# Patient Record
Sex: Female | Born: 1935 | Race: White | Hispanic: No | Smoking: Never smoker
Health system: Southern US, Community
[De-identification: ages and names within clinical notes are randomized; demographics above are authoritative.]

## PROBLEM LIST (undated history)

## (undated) DIAGNOSIS — I82409 Acute embolism and thrombosis of unspecified deep veins of unspecified lower extremity: Secondary | ICD-10-CM

## (undated) DIAGNOSIS — G4700 Insomnia, unspecified: Secondary | ICD-10-CM

## (undated) DIAGNOSIS — M47812 Spondylosis without myelopathy or radiculopathy, cervical region: Secondary | ICD-10-CM

## (undated) DIAGNOSIS — F32A Depression, unspecified: Secondary | ICD-10-CM

## (undated) DIAGNOSIS — C801 Malignant (primary) neoplasm, unspecified: Secondary | ICD-10-CM

## (undated) DIAGNOSIS — F329 Major depressive disorder, single episode, unspecified: Secondary | ICD-10-CM

## (undated) DIAGNOSIS — M5090 Cervical disc disorder, unspecified, unspecified cervical region: Secondary | ICD-10-CM

## (undated) DIAGNOSIS — F0390 Unspecified dementia without behavioral disturbance: Secondary | ICD-10-CM

## (undated) DIAGNOSIS — R350 Frequency of micturition: Secondary | ICD-10-CM

## (undated) DIAGNOSIS — M810 Age-related osteoporosis without current pathological fracture: Secondary | ICD-10-CM

## (undated) DIAGNOSIS — R351 Nocturia: Secondary | ICD-10-CM

## (undated) DIAGNOSIS — K224 Dyskinesia of esophagus: Secondary | ICD-10-CM

## (undated) DIAGNOSIS — K225 Diverticulum of esophagus, acquired: Secondary | ICD-10-CM

## (undated) DIAGNOSIS — G629 Polyneuropathy, unspecified: Secondary | ICD-10-CM

## (undated) DIAGNOSIS — E785 Hyperlipidemia, unspecified: Secondary | ICD-10-CM

## (undated) DIAGNOSIS — G5600 Carpal tunnel syndrome, unspecified upper limb: Secondary | ICD-10-CM

## (undated) DIAGNOSIS — J189 Pneumonia, unspecified organism: Secondary | ICD-10-CM

## (undated) DIAGNOSIS — R944 Abnormal results of kidney function studies: Secondary | ICD-10-CM

## (undated) DIAGNOSIS — R32 Unspecified urinary incontinence: Secondary | ICD-10-CM

## (undated) DIAGNOSIS — I10 Essential (primary) hypertension: Secondary | ICD-10-CM

## (undated) DIAGNOSIS — G8929 Other chronic pain: Secondary | ICD-10-CM

## (undated) DIAGNOSIS — K219 Gastro-esophageal reflux disease without esophagitis: Secondary | ICD-10-CM

## (undated) DIAGNOSIS — N189 Chronic kidney disease, unspecified: Secondary | ICD-10-CM

## (undated) DIAGNOSIS — F419 Anxiety disorder, unspecified: Secondary | ICD-10-CM

## (undated) DIAGNOSIS — I739 Peripheral vascular disease, unspecified: Secondary | ICD-10-CM

## (undated) DIAGNOSIS — M549 Dorsalgia, unspecified: Secondary | ICD-10-CM

## (undated) DIAGNOSIS — J449 Chronic obstructive pulmonary disease, unspecified: Secondary | ICD-10-CM

## (undated) DIAGNOSIS — M5410 Radiculopathy, site unspecified: Secondary | ICD-10-CM

## (undated) DIAGNOSIS — G2581 Restless legs syndrome: Secondary | ICD-10-CM

## (undated) DIAGNOSIS — C569 Malignant neoplasm of unspecified ovary: Secondary | ICD-10-CM

## (undated) HISTORY — DX: Chronic obstructive pulmonary disease, unspecified: J44.9

## (undated) HISTORY — DX: Malignant (primary) neoplasm, unspecified: C80.1

## (undated) HISTORY — PX: ABDOMINAL HYSTERECTOMY: SHX81

## (undated) HISTORY — DX: Dyskinesia of esophagus: K22.4

## (undated) HISTORY — DX: Chronic kidney disease, unspecified: N18.9

## (undated) HISTORY — DX: Spondylosis without myelopathy or radiculopathy, cervical region: M47.812

## (undated) HISTORY — DX: Peripheral vascular disease, unspecified: I73.9

## (undated) HISTORY — DX: Abnormal results of kidney function studies: R94.4

## (undated) HISTORY — DX: Hyperlipidemia, unspecified: E78.5

## (undated) HISTORY — DX: Gastro-esophageal reflux disease without esophagitis: K21.9

## (undated) HISTORY — DX: Carpal tunnel syndrome, unspecified upper limb: G56.00

## (undated) HISTORY — DX: Anxiety disorder, unspecified: F41.9

## (undated) HISTORY — PX: TONSILLECTOMY: SUR1361

## (undated) HISTORY — DX: Diverticulum of esophagus, acquired: K22.5

## (undated) HISTORY — DX: Acute embolism and thrombosis of unspecified deep veins of unspecified lower extremity: I82.409

## (undated) HISTORY — DX: Polyneuropathy, unspecified: G62.9

## (undated) HISTORY — PX: OTHER SURGICAL HISTORY: SHX169

## (undated) HISTORY — DX: Malignant neoplasm of unspecified ovary: C56.9

## (undated) HISTORY — DX: Essential (primary) hypertension: I10

## (undated) HISTORY — DX: Age-related osteoporosis without current pathological fracture: M81.0

## (undated) HISTORY — DX: Unspecified urinary incontinence: R32

## (undated) HISTORY — DX: Radiculopathy, site unspecified: M54.10

## (undated) HISTORY — PX: COLONOSCOPY: SHX174

## (undated) HISTORY — PX: BACK SURGERY: SHX140

## (undated) HISTORY — DX: Restless legs syndrome: G25.81

---
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
## 1997-08-31 ENCOUNTER — Other Ambulatory Visit: Admission: RE | Admit: 1997-08-31 | Discharge: 1997-08-31 | Payer: Self-pay | Admitting: Gynecology

## 1998-01-05 ENCOUNTER — Ambulatory Visit (HOSPITAL_COMMUNITY): Admission: RE | Admit: 1998-01-05 | Discharge: 1998-01-05 | Payer: Self-pay | Admitting: *Deleted

## 1998-01-15 ENCOUNTER — Ambulatory Visit (HOSPITAL_COMMUNITY): Admission: RE | Admit: 1998-01-15 | Discharge: 1998-01-15 | Payer: Self-pay | Admitting: *Deleted

## 1998-06-29 ENCOUNTER — Ambulatory Visit (HOSPITAL_COMMUNITY): Admission: RE | Admit: 1998-06-29 | Discharge: 1998-06-29 | Payer: Self-pay | Admitting: Gastroenterology

## 1998-11-08 ENCOUNTER — Other Ambulatory Visit: Admission: RE | Admit: 1998-11-08 | Discharge: 1998-11-08 | Payer: Self-pay | Admitting: Gynecology

## 1999-11-17 ENCOUNTER — Other Ambulatory Visit: Admission: RE | Admit: 1999-11-17 | Discharge: 1999-11-17 | Payer: Self-pay | Admitting: Gynecology

## 1999-12-08 ENCOUNTER — Encounter: Payer: Self-pay | Admitting: Gynecology

## 1999-12-08 ENCOUNTER — Encounter: Admission: RE | Admit: 1999-12-08 | Discharge: 1999-12-08 | Payer: Self-pay | Admitting: Gynecology

## 2000-05-15 ENCOUNTER — Other Ambulatory Visit: Admission: RE | Admit: 2000-05-15 | Discharge: 2000-05-15 | Payer: Self-pay | Admitting: *Deleted

## 2000-11-29 ENCOUNTER — Other Ambulatory Visit: Admission: RE | Admit: 2000-11-29 | Discharge: 2000-11-29 | Payer: Self-pay | Admitting: *Deleted

## 2000-12-10 ENCOUNTER — Encounter: Payer: Self-pay | Admitting: Gynecology

## 2000-12-10 ENCOUNTER — Encounter: Admission: RE | Admit: 2000-12-10 | Discharge: 2000-12-10 | Payer: Self-pay | Admitting: Gynecology

## 2001-10-28 ENCOUNTER — Other Ambulatory Visit: Admission: RE | Admit: 2001-10-28 | Discharge: 2001-10-28 | Payer: Self-pay | Admitting: Gynecology

## 2002-01-08 ENCOUNTER — Encounter: Admission: RE | Admit: 2002-01-08 | Discharge: 2002-01-08 | Payer: Self-pay | Admitting: Gynecology

## 2002-01-08 ENCOUNTER — Encounter: Payer: Self-pay | Admitting: Gynecology

## 2002-08-08 ENCOUNTER — Encounter: Admission: RE | Admit: 2002-08-08 | Discharge: 2002-08-08 | Payer: Self-pay | Admitting: Internal Medicine

## 2002-08-08 ENCOUNTER — Encounter: Payer: Self-pay | Admitting: Internal Medicine

## 2002-08-21 ENCOUNTER — Ambulatory Visit (HOSPITAL_COMMUNITY): Admission: RE | Admit: 2002-08-21 | Discharge: 2002-08-21 | Payer: Self-pay | Admitting: Cardiology

## 2002-10-28 ENCOUNTER — Other Ambulatory Visit: Admission: RE | Admit: 2002-10-28 | Discharge: 2002-10-28 | Payer: Self-pay | Admitting: Gynecology

## 2002-11-11 ENCOUNTER — Encounter: Admission: RE | Admit: 2002-11-11 | Discharge: 2002-12-31 | Payer: Self-pay | Admitting: Internal Medicine

## 2003-02-02 ENCOUNTER — Encounter: Admission: RE | Admit: 2003-02-02 | Discharge: 2003-02-02 | Payer: Self-pay | Admitting: Family Medicine

## 2003-02-02 ENCOUNTER — Encounter: Admission: RE | Admit: 2003-02-02 | Discharge: 2003-02-02 | Payer: Self-pay | Admitting: Gynecology

## 2003-10-29 ENCOUNTER — Other Ambulatory Visit: Admission: RE | Admit: 2003-10-29 | Discharge: 2003-10-29 | Payer: Self-pay | Admitting: Gynecology

## 2003-12-14 ENCOUNTER — Encounter: Admission: RE | Admit: 2003-12-14 | Discharge: 2003-12-14 | Payer: Self-pay | Admitting: Internal Medicine

## 2004-01-06 ENCOUNTER — Ambulatory Visit (HOSPITAL_COMMUNITY): Admission: RE | Admit: 2004-01-06 | Discharge: 2004-01-06 | Payer: Self-pay | Admitting: Gastroenterology

## 2004-02-22 ENCOUNTER — Encounter: Admission: RE | Admit: 2004-02-22 | Discharge: 2004-02-22 | Payer: Self-pay | Admitting: Internal Medicine

## 2004-11-04 ENCOUNTER — Other Ambulatory Visit: Admission: RE | Admit: 2004-11-04 | Discharge: 2004-11-04 | Payer: Self-pay | Admitting: Gynecology

## 2005-02-22 ENCOUNTER — Encounter: Admission: RE | Admit: 2005-02-22 | Discharge: 2005-02-22 | Payer: Self-pay | Admitting: Gynecology

## 2005-06-27 ENCOUNTER — Ambulatory Visit (HOSPITAL_BASED_OUTPATIENT_CLINIC_OR_DEPARTMENT_OTHER): Admission: RE | Admit: 2005-06-27 | Discharge: 2005-06-27 | Payer: Self-pay | Admitting: Orthopedic Surgery

## 2006-02-23 ENCOUNTER — Encounter: Admission: RE | Admit: 2006-02-23 | Discharge: 2006-02-23 | Payer: Self-pay | Admitting: Gynecology

## 2006-03-05 ENCOUNTER — Encounter: Admission: RE | Admit: 2006-03-05 | Discharge: 2006-03-05 | Payer: Self-pay | Admitting: Gynecology

## 2006-08-23 ENCOUNTER — Encounter: Admission: RE | Admit: 2006-08-23 | Discharge: 2006-08-23 | Payer: Self-pay | Admitting: Internal Medicine

## 2006-11-19 ENCOUNTER — Other Ambulatory Visit: Admission: RE | Admit: 2006-11-19 | Discharge: 2006-11-19 | Payer: Self-pay | Admitting: Gynecology

## 2007-02-26 ENCOUNTER — Encounter: Admission: RE | Admit: 2007-02-26 | Discharge: 2007-02-26 | Payer: Self-pay | Admitting: Internal Medicine

## 2008-02-27 ENCOUNTER — Encounter: Admission: RE | Admit: 2008-02-27 | Discharge: 2008-02-27 | Payer: Self-pay | Admitting: Gynecology

## 2009-03-10 ENCOUNTER — Encounter: Admission: RE | Admit: 2009-03-10 | Discharge: 2009-03-10 | Payer: Self-pay | Admitting: Gynecology

## 2009-03-26 ENCOUNTER — Encounter: Admission: RE | Admit: 2009-03-26 | Discharge: 2009-03-26 | Payer: Self-pay | Admitting: Orthopedic Surgery

## 2009-11-29 ENCOUNTER — Encounter: Admission: RE | Admit: 2009-11-29 | Discharge: 2009-11-29 | Payer: Self-pay | Admitting: Gynecology

## 2010-01-31 ENCOUNTER — Emergency Department: Payer: Self-pay | Admitting: Emergency Medicine

## 2010-03-11 ENCOUNTER — Encounter
Admission: RE | Admit: 2010-03-11 | Discharge: 2010-03-11 | Payer: Self-pay | Source: Home / Self Care | Attending: Gynecology | Admitting: Gynecology

## 2010-04-03 ENCOUNTER — Encounter: Payer: Self-pay | Admitting: Gynecology

## 2010-07-29 NOTE — Op Note (Signed)
NAMEGLENN, Misty Patterson                ACCOUNT NO.:  [id number]   MEDICAL RECORD NO.:  [id number]          PATIENT TYPE:  AMB   LOCATION:  [facility]                         FACILITY:  [facility]   PHYSICIAN:  Deidre Ala, M.D.    DATE OF BIRTH:  Jan 11, 1927   DATE OF PROCEDURE:  06/27/2005  DATE OF DISCHARGE:                                 OPERATIVE REPORT   PREOPERATIVE DIAGNOSES:  1.  Right third metatarsalgia, plantar.  2.  Recurrent mallet toe, right fifth toe, with painful osteophyte at      dorsolateral distal interphalangeal joint.   POSTOPERATIVE DIAGNOSES:  1.  Right third metatarsalgia, plantar.  2.  Recurrent mallet toe, right fifth toe, with painful osteophyte at      dorsolateral distal interphalangeal joint.   PROCEDURE:  1.  Right foot dorsal wedge osteotomy with local autograft, right third      metatarsal neck.  2.  Right fifth toe debridement of distal interphalangeal      osteophytes/resection of condyle at distal phalanx and middle phalanx.  3.  DuVries corrective dorsal wedge osteotomy with correction of mallet toe,      right fifth toe.   SURGEON:  1.  Charlesetta Shanks, M.D.   ASSISTANT:  Clarene Reamer, P.A.-C.   ANESTHESIA:  General with LMA.   CULTURES:  None.   DRAINS:  None.   ESTIMATED BLOOD LOSS:  Minimal.   TOURNIQUET TIME:  Esmarch tourniquet at 30 minutes.   PATHOLOGIC FINDINGS AND HISTORY:  Torsha came to me with vague foot  complaints on June 19, 2005.  She did have a painful callus/osteophyte  underlying her DIP joint of the right fifth toe.  I do not have old records,  but she stated that I had done DuVries osteotomies there before.  The toe  was feeling somewhat bunched-up and she had had some freezing-off of the  skin area and wearing some pads, but it had not worked, and it was very  painful.  It seemed to me that it was an osteophyte pressing underneath the  toe.  When she came in today, it was clear to me that one of the things that  was  bothering her was that the toe was stiff and bunching-out to the side  somewhat laterally, so what I elected to do, and we talked about it  preoperatively, was to take off the osteophyte, but also to do a closing  edge osteotomy.  Actually, the toe was crossing over into the fourth toe so  that the osteotomy was from the DuVries at the DIP joint with a wedge  dorsolateral, closing it down, bring the toe more lateral in line with the  fourth toe, which was somewhat laterally situated, nestling it nicely down  into the sulcus at the base of the right fourth toe, shortening it and  straightening it somewhat so that it would be less prominent and conspicuous  and getting pressure in the shoe.  I also excised skin as one does with the  DuVries with the edge, excising the irritated skin overtop.  She did  have  very sharp osteophytes that I removed first.  Then on the right third toe, I  went in the interspace between 2 and 3 and did the classic dorsal edge  osteotomy, morcellizing the bone graft for autograft.  I did not use  fixation, but I did suture the capsule dorsally, bringing the metatarsal  head upward.  The second and fourth metatarsal heads were not painful and  not prominent preoperatively significantly, but the third was and appeared  longer and more prominent.  This brought it up and I think the second and  fourth will take pressure also now that the third is up and further pad the  area.  This third metatarsal head was something that she complained about  today that was somewhat clearer to me today about her foot pain.  When we  got right down to exactly the spots that were hurting her, it was the right  third metatarsal head that felt over-long, taking the pressure and this  reproduced her pain when I pressed there; she was not painful under 1, 2, 3  or 4 and she was complaining of forefoot discomfort with walking and this  solidified the concept for her and we elected to proceed  with a dorsal wedge  osteotomy.   DESCRIPTION OF PROCEDURE:  With adequate anesthesia obtained using LMA  technique, 1 g of Ancef given IV prophylaxis, the patient was placed in a  supine position and the right lower extremity was prepped from the toes to  the ankle in a standard fashion.  After standard prepping and draping,  Esmarch exsanguination was used and the tourniquet was left on above the  ankle.  I then made an incision, excising skin on the dorsolateral aspect of  the DIP joint of the right fifth toe.  I then dissected down through the  capsule, removing sharply the large osteophytes underneath that were  irritating the skin.  I then took a bone cutter and cut the wedge of bone  out based with the apex plantar-medial and then the opposite side of the  base of the wedge was dorsolateral.  I then cut the flexor tendon.  I then  brought the toe up, configuring it shorter, straighter and nestled down in  the concavity of the base of the fourth where she seemed to want it to go,  so it would not be catching in the shoe with pressure, and I closed it over  bolsters of Xeroflo rolled up on a Q-tip.  I aspirated the DuVries procedure  and sutured a vertical mattress suture of 4-0 nylon and then sutured on  either side of it; this closed the skin, corrected the position of the toe  and held it.  I then made an incision in the 2-3 web space, dissected down  to the neck of the third metatarsal, placed retractors, used the oscillating  saw to cut the osteotomy, the first cut at the metatarsal neck and the  second one at an ankle, morcellized the bone that was removed and then  cracked the metatarsal head upward, placed the bone graft around it, sutured  the dorsal capsule with 3-0 Vicryl and then closed that wound after  cauterizing bleeding points with a running 4-0 nylon.  A bulky sterile  compressive forefoot dressing was applied with a wooden sole shoe, Darby type, over top of the  Ace.  The tourniquet was let down.  The patient,  having tolerated the procedure well, was taken  to the recovery room in  satisfactory condition and will be discharged per outpatient routine,  weightbearing as tolerated on her heel with elevation, given Percocet for  pain and told to call the office in 3-4 days.           ______________________________  V. Charlesetta Shanks, M.D.     VEP/MEDQ  D:  06/27/2005  T:  06/28/2005  Job:  [id number]   cc:   Antionette Char, MD  Fax: [phone]

## 2010-07-29 NOTE — Op Note (Signed)
Misty Patterson, Misty Patterson                ACCOUNT NO.:  [id number]   MEDICAL RECORD NO.:  [id number]          PATIENT TYPE:  AMB   LOCATION:  ENDO                         FACILITY:  [facility]   PHYSICIAN:  Anselmo Rod, M.D.  DATE OF BIRTH:  [birth year]   DATE OF PROCEDURE:  01/06/2004  DATE OF DISCHARGE:                                 OPERATIVE REPORT   PROCEDURE PERFORMED:  Screening colonoscopy.   ENDOSCOPIST:  Charna Elizabeth, M.D.   INSTRUMENT USED:  Olympus video colonoscope.   INDICATION FOR PROCEDURE:  A 77 year old white female with a personal  history of ovarian cancer and a history of colon polyps, undergoing  screening colonoscopy to rule out colonic polyps, masses, etc.   PREPROCEDURE PREPARATION:  Informed consent was procured from the patient.  The patient was fasted for 8 hours prior to the procedure and prepped with a  bottle of magnesium citrate and a gallon of GoLYTELY the night prior to the  procedure.   PREPROCEDURE PHYSICAL:  The patient had stable vital signs.  NECK:  Supple.  CHEST:  Clear to auscultation, S1, S2 regular.  ABDOMEN:  Soft with normal bowel sounds.   DESCRIPTION OF THE PROCEDURE:  The patient was placed in the left lateral  decubitus position, sedated with 60 mg of Demerol and 6 mg of Versed in slow  incremental doses.  Once the patient was adequately sedated and maintained  on low flow oxygen and continuous cardiac monitoring, the Olympus video  colonoscope was advanced from the rectum to the cecum.  The appendicular  orifice and ileocecal valve were clearly visualized and photographed.  No  masses, polyps, erosions, ulcerations or diverticula were seen.  The patient  tolerated the procedure well without immediate complications.   IMPRESSION:  Normal colonoscopy up to the cecum.  No masses, polyps or  diverticula were seen.   RECOMMENDATIONS:  1.  Continue on high fiber diet with liberal fluid intake.  2.  Repeat colonoscopy in the next 5 years  unless the patient develops any      abnormal symptoms in the interim.  3.  Outpatient followup as need arises in the future.       JNM/MEDQ  D:  01/06/2004  T:  01/06/2004  Job:  [id number]   cc:   Georgann Housekeeper, MD  [address]  [place]  [place] [address]  Fax: [phone]
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
## 2010-12-07 ENCOUNTER — Other Ambulatory Visit: Payer: Self-pay | Admitting: Dermatology

## 2011-01-16 ENCOUNTER — Other Ambulatory Visit: Payer: Self-pay | Admitting: Gynecology

## 2011-01-16 DIAGNOSIS — Z1231 Encounter for screening mammogram for malignant neoplasm of breast: Secondary | ICD-10-CM

## 2011-03-13 ENCOUNTER — Ambulatory Visit
Admission: RE | Admit: 2011-03-13 | Discharge: 2011-03-13 | Disposition: A | Discharge status: Home / Self Care | Payer: Medicare Other | Source: Ambulatory Visit | Attending: Gynecology | Admitting: Gynecology

## 2011-03-13 DIAGNOSIS — Z1231 Encounter for screening mammogram for malignant neoplasm of breast: Secondary | ICD-10-CM

## 2011-03-16 ENCOUNTER — Other Ambulatory Visit: Payer: Self-pay | Admitting: Gynecology

## 2011-03-16 DIAGNOSIS — R928 Other abnormal and inconclusive findings on diagnostic imaging of breast: Secondary | ICD-10-CM

## 2011-03-24 ENCOUNTER — Ambulatory Visit
Admission: RE | Admit: 2011-03-24 | Discharge: 2011-03-24 | Disposition: A | Discharge status: Home / Self Care | Payer: Medicare Other | Source: Ambulatory Visit | Attending: Gynecology | Admitting: Gynecology

## 2011-03-24 ENCOUNTER — Other Ambulatory Visit: Payer: Self-pay | Admitting: Gynecology

## 2011-03-24 DIAGNOSIS — R928 Other abnormal and inconclusive findings on diagnostic imaging of breast: Secondary | ICD-10-CM

## 2011-04-20 ENCOUNTER — Other Ambulatory Visit: Payer: Self-pay | Admitting: Internal Medicine

## 2011-04-28 ENCOUNTER — Ambulatory Visit
Admission: RE | Admit: 2011-04-28 | Discharge: 2011-04-28 | Disposition: A | Discharge status: Home / Self Care | Payer: Medicare Other | Source: Ambulatory Visit | Attending: Internal Medicine | Admitting: Internal Medicine

## 2011-12-12 ENCOUNTER — Other Ambulatory Visit: Payer: Self-pay | Admitting: Internal Medicine

## 2011-12-12 ENCOUNTER — Ambulatory Visit
Admission: RE | Admit: 2011-12-12 | Discharge: 2011-12-12 | Disposition: A | Discharge status: Home / Self Care | Payer: No Typology Code available for payment source | Source: Ambulatory Visit | Attending: Internal Medicine | Admitting: Internal Medicine

## 2011-12-12 DIAGNOSIS — R519 Headache, unspecified: Secondary | ICD-10-CM

## 2011-12-12 DIAGNOSIS — W19XXXA Unspecified fall, initial encounter: Secondary | ICD-10-CM

## 2011-12-12 DIAGNOSIS — R42 Dizziness and giddiness: Secondary | ICD-10-CM

## 2011-12-25 ENCOUNTER — Other Ambulatory Visit: Payer: Self-pay

## 2011-12-25 DIAGNOSIS — J449 Chronic obstructive pulmonary disease, unspecified: Secondary | ICD-10-CM

## 2011-12-25 DIAGNOSIS — R0602 Shortness of breath: Secondary | ICD-10-CM

## 2011-12-28 ENCOUNTER — Ambulatory Visit (HOSPITAL_COMMUNITY)
Admission: RE | Admit: 2011-12-28 | Discharge: 2011-12-28 | Disposition: A | Discharge status: Home / Self Care | Payer: Medicare Other | Source: Ambulatory Visit | Attending: Internal Medicine | Admitting: Internal Medicine

## 2011-12-28 DIAGNOSIS — R0602 Shortness of breath: Secondary | ICD-10-CM | POA: Insufficient documentation

## 2011-12-28 MED ORDER — ALBUTEROL SULFATE (5 MG/ML) 0.5% IN NEBU
2.5000 mg | INHALATION_SOLUTION | Freq: Once | RESPIRATORY_TRACT | Status: AC
  Administered 2011-12-28: 2.5 mg via RESPIRATORY_TRACT

## 2012-02-23 ENCOUNTER — Other Ambulatory Visit: Payer: Self-pay | Admitting: Gynecology

## 2012-02-23 DIAGNOSIS — Z1231 Encounter for screening mammogram for malignant neoplasm of breast: Secondary | ICD-10-CM

## 2012-03-27 ENCOUNTER — Ambulatory Visit
Admission: RE | Admit: 2012-03-27 | Discharge: 2012-03-27 | Disposition: A | Discharge status: Home / Self Care | Payer: Medicare Other | Source: Ambulatory Visit | Attending: Gynecology | Admitting: Gynecology

## 2012-03-27 DIAGNOSIS — Z1231 Encounter for screening mammogram for malignant neoplasm of breast: Secondary | ICD-10-CM

## 2012-04-09 ENCOUNTER — Ambulatory Visit: Payer: Medicare Other

## 2012-07-03 ENCOUNTER — Other Ambulatory Visit: Payer: Self-pay | Admitting: Dermatology

## 2013-01-13 ENCOUNTER — Ambulatory Visit
Admission: RE | Admit: 2013-01-13 | Discharge: 2013-01-13 | Disposition: A | Discharge status: Home / Self Care | Payer: Medicare Other | Source: Ambulatory Visit | Attending: Internal Medicine | Admitting: Internal Medicine

## 2013-01-13 ENCOUNTER — Other Ambulatory Visit: Payer: Self-pay | Admitting: Internal Medicine

## 2013-01-13 DIAGNOSIS — M25579 Pain in unspecified ankle and joints of unspecified foot: Secondary | ICD-10-CM

## 2013-01-28 ENCOUNTER — Ambulatory Visit (INDEPENDENT_AMBULATORY_CARE_PROVIDER_SITE_OTHER): Payer: Self-pay | Admitting: Radiology

## 2013-01-28 ENCOUNTER — Encounter (INDEPENDENT_AMBULATORY_CARE_PROVIDER_SITE_OTHER): Payer: Self-pay

## 2013-01-28 ENCOUNTER — Ambulatory Visit (INDEPENDENT_AMBULATORY_CARE_PROVIDER_SITE_OTHER): Payer: Medicare Other | Admitting: Neurology

## 2013-01-28 DIAGNOSIS — R209 Unspecified disturbances of skin sensation: Secondary | ICD-10-CM

## 2013-01-28 DIAGNOSIS — G5600 Carpal tunnel syndrome, unspecified upper limb: Secondary | ICD-10-CM

## 2013-01-28 DIAGNOSIS — G542 Cervical root disorders, not elsewhere classified: Secondary | ICD-10-CM

## 2013-01-28 DIAGNOSIS — G5601 Carpal tunnel syndrome, right upper limb: Secondary | ICD-10-CM

## 2013-01-28 DIAGNOSIS — Z0289 Encounter for other administrative examinations: Secondary | ICD-10-CM

## 2013-01-28 DIAGNOSIS — G5602 Carpal tunnel syndrome, left upper limb: Secondary | ICD-10-CM

## 2013-01-28 NOTE — Procedures (Signed)
HISTORY:  Misty Patterson is an 79 year old patient with a history of a fall that occurred in October 2013. Since that time, the patient has had some numbness in the hands, and some right leg discomfort. The patient is being evaluated for possible neuropathy or a cervical radiculopathy.  NERVE CONDUCTION STUDIES:  Nerve conduction studies were performed on both upper extremities. The distal motor latencies for the median nerves were prolonged on the right, borderline normal on the left, with normal motor amplitudes for these nerves bilaterally. The distal motor latencies and motor amplitudes for the ulnar nerves were normal bilaterally. The F wave latencies and nerve conduction velocities for the median and ulnar nerves were normal bilaterally. The sensory latencies for the median nerves were absent on the right, prolonged on the left, and prolonged for the ulnar and radial sensory latencies bilaterally.  EMG STUDIES:  EMG study was performed on the right upper extremity:  The first dorsal interosseous muscle reveals 2 to 5 K units with decreased recruitment. No fibrillations or positive waves were noted. The abductor pollicis brevis muscle reveals 2 to 4 K units with full recruitment. No fibrillations or positive waves were noted. The extensor indicis proprius muscle reveals 1 to 3 K units with full recruitment. No fibrillations or positive waves were noted. The pronator teres muscle reveals 2 to 3 K units with full recruitment. No fibrillations or positive waves were noted. The biceps muscle reveals 1 to 2 K units with full recruitment. No fibrillations or positive waves were noted. The triceps muscle reveals 2 to 4 K units with full recruitment. No fibrillations or positive waves were noted. The anterior deltoid muscle reveals 2 to 3 K units with full recruitment. No fibrillations or positive waves were noted. The cervical paraspinal muscles were tested at 2 levels. No abnormalities of  insertional activity were seen at either level tested. There was good relaxation.  EMG study was performed on the left upper extremity:  The first dorsal interosseous muscle reveals 2 to 5 K units with decreased recruitment. No fibrillations or positive waves were noted. The abductor pollicis brevis muscle reveals 2 to 5 K units with decreased recruitment. No fibrillations or positive waves were noted. The extensor indicis proprius muscle reveals 2 to 5 K units with decreased recruitment. No fibrillations or positive waves were noted. The pronator teres muscle reveals 2 to 6 K units with decreased recruitment. No fibrillations or positive waves were noted. The biceps muscle reveals 1 to 2 K units with full recruitment. No fibrillations or positive waves were noted. The triceps muscle reveals 2 to 5 K units with decreased recruitment. No fibrillations or positive waves were noted. Complex repetitive discharges were seen. The anterior deltoid muscle reveals 2 to 3 K units with full recruitment. No fibrillations or positive waves were noted. The cervical paraspinal muscles were tested at 2 levels. No abnormalities of insertional activity were seen at either level tested. There was good relaxation.   IMPRESSION:  Nerve conduction studies done on both upper extremities shows evidence of a mild right carpal tunnel syndrome, and a borderline left carpal tunnel syndrome. There is diffuse prolongation of the sensory latencies in the upper extremities, and the possibility of an overlying peripheral neuropathy should be considered. The patient does report some numbness and burning sensations in the feet. EMG evaluation of the right upper extremity shows some mild distal chronic denervation consistent with a peripheral neuropathy, but no evidence of an overlying cervical radiculopathy. EMG evaluation  of the left upper extremity shows findings consistent with a chronic stable C8 and C7 radiculopathy.  Marlan Palau MD 01/28/2013 4:32 PM  [facility] [address] [place], [place] [address]  Phone [phone] Fax [phone]

## 2013-03-19 ENCOUNTER — Other Ambulatory Visit (HOSPITAL_COMMUNITY): Payer: Self-pay | Admitting: Orthopedic Surgery

## 2013-03-19 ENCOUNTER — Ambulatory Visit (HOSPITAL_COMMUNITY)
Admission: RE | Admit: 2013-03-19 | Discharge: 2013-03-19 | Disposition: A | Discharge status: Home / Self Care | Payer: Medicare Other | Source: Ambulatory Visit | Attending: Vascular Surgery | Admitting: Vascular Surgery

## 2013-03-19 DIAGNOSIS — R0989 Other specified symptoms and signs involving the circulatory and respiratory systems: Secondary | ICD-10-CM

## 2013-03-19 DIAGNOSIS — M79609 Pain in unspecified limb: Secondary | ICD-10-CM

## 2013-04-03 ENCOUNTER — Ambulatory Visit (INDEPENDENT_AMBULATORY_CARE_PROVIDER_SITE_OTHER): Payer: Medicare Other | Admitting: Vascular Surgery

## 2013-04-03 ENCOUNTER — Encounter: Payer: Self-pay | Admitting: Vascular Surgery

## 2013-04-03 VITALS — BP 133/72 | HR 73 | Wt 146.0 lb

## 2013-04-03 DIAGNOSIS — I739 Peripheral vascular disease, unspecified: Secondary | ICD-10-CM

## 2013-04-03 NOTE — Progress Notes (Signed)
[facility] [place] HISTORY AND PHYSICAL   History of Present Illness:  Patient is a 80 y.o. year old female who presents for evaluation of right leg pain. She currently experiences tightness and pain in her right calf after walking one block. She started developing a symptoms after breaking her toe approximately one year ago. She has had bilateral saphenous vein stripping. She denies rest pain. She denies nonhealing wounds.  Other medical problems include past history of ovarian cancer and hypertension both of which are controlled. Otherwise overall she is fairly healthy and performed most of her tasks of daily living independently. She does not smoke.  Past Medical History  Diagnosis Date  . Cancer     ovarian    Past Surgical History  Procedure Laterality Date  . Vein surgery  1950    Social History History  Substance Use Topics  . Smoking status: Never Smoker   . Smokeless tobacco: Never Used  . Alcohol Use: No    Family History History reviewed. No pertinent family history.  Allergies  No Known Allergies   Current Outpatient Prescriptions  Medication Sig Dispense Refill  . amLODipine (NORVASC) 5 MG tablet Take 1 tablet by mouth daily.      Marland Kitchen atenolol (TENORMIN) 25 MG tablet Take 12.5 mg by mouth daily.      . diazepam (VALIUM) 5 MG tablet Take 1 tablet by mouth as needed.      . famotidine (PEPCID) 20 MG tablet Take 1 tablet by mouth daily.      . hydrochlorothiazide (HYDRODIURIL) 25 MG tablet Take 0.5 tablets by mouth daily.      Marland Kitchen HYDROcodone-acetaminophen (NORCO/VICODIN) 5-325 MG per tablet Take 1 tablet by mouth as needed.      . sertraline (ZOLOFT) 25 MG tablet Take 1 tablet by mouth daily.      Marland Kitchen sulfamethoxazole-trimethoprim (BACTRIM DS) 800-160 MG per tablet Take 1 tablet by mouth 2 (two) times daily.      . traZODone (DESYREL) 50 MG tablet Take 1 tablet by mouth daily.       No current facility-administered medications for this visit.     ROS:   General:  No weight loss, Fever, chills  HEENT: No recent headaches, no nasal bleeding, no visual changes, no sore throat  Neurologic: No dizziness, blackouts, seizures. No recent symptoms of stroke or mini- stroke. No recent episodes of slurred speech, or temporary blindness.  Cardiac: No recent episodes of chest pain/pressure, no shortness of breath at rest.  No shortness of breath with exertion.  Denies history of atrial fibrillation or irregular heartbeat  Vascular: No history of rest pain in feet.  + history of claudication.  No history of non-healing ulcer, No history of DVT   Pulmonary: No home oxygen, no productive cough, no hemoptysis,  No asthma or wheezing  Musculoskeletal:  [ ]  Arthritis, [ ]  Low back pain,  [ ]  Joint pain  Hematologic:No history of hypercoagulable state.  No history of easy bleeding.  No history of anemia  Gastrointestinal: No hematochezia or melena,  No gastroesophageal reflux, no trouble swallowing  Urinary: [ ]  chronic Kidney disease, [ ]  on HD - [ ]  MWF or [ ]  TTHS, [ ]  Burning with urination, [ ]  Frequent urination, [ ]  Difficulty urinating;   Skin: No rashes  Psychological: No history of anxiety,  No history of depression   Physical Examination  Filed Vitals:   04/03/13 1000  BP: 133/72  Pulse: 73  Weight: 146 lb (66.225 kg)  SpO2: 100%    There is no height on file to calculate BMI.  General:  Alert and oriented, no acute distress HEENT: Normal Neck: No bruit or JVD Pulmonary: Clear to auscultation bilaterally Cardiac: Regular Rate and Rhythm without murmur Abdomen: Soft, non-tender, non-distended, no mass, no scars Skin: No rash Extremity Pulses:  2+ radial, brachial, femoral, right dorsalis pedis, absent left dorsalis pedis and posterior tibial pulse Musculoskeletal: No deformity or edema  Neurologic: Upper and lower extremity motor 5/5 and symmetric  DATA:  Patient had bilateral ABIs on January 7 which I reviewed  today. She ABI left 0.9 a with triphasic waveforms right was 0.49 monophasic waveforms   ASSESSMENT:  Right lower extremity claudication. I discussed with the patient that currently she does not have limb threatening ischemia. I discussed with her the possibility of a walking program and conservative management versus intervention. At this point she has opted for aortogram possible percutaneous intervention. Her arteriogram is scheduled for February 20. Risks benefits possible complications and procedure details including but limited to bleeding infection contrast reaction vessel injury were explained the patient today. She understands and agrees to proceed.   PLAN:  See above  Ruta Hinds, MD [facility] [place] Office: [phone] Pager: [phone]

## 2013-04-04 ENCOUNTER — Other Ambulatory Visit: Payer: Self-pay | Admitting: *Deleted

## 2013-04-28 ENCOUNTER — Encounter (HOSPITAL_COMMUNITY): Payer: Self-pay | Admitting: Pharmacy Technician

## 2013-05-02 ENCOUNTER — Encounter (HOSPITAL_COMMUNITY)
Admission: RE | Disposition: A | Discharge status: Home / Self Care | Payer: Self-pay | Source: Ambulatory Visit | Attending: Vascular Surgery

## 2013-05-02 ENCOUNTER — Ambulatory Visit (HOSPITAL_COMMUNITY)
Admission: RE | Admit: 2013-05-02 | Discharge: 2013-05-02 | Disposition: A | Discharge status: Home / Self Care | Payer: Medicare Other | Source: Ambulatory Visit | Attending: Vascular Surgery | Admitting: Vascular Surgery

## 2013-05-02 DIAGNOSIS — I70219 Atherosclerosis of native arteries of extremities with intermittent claudication, unspecified extremity: Secondary | ICD-10-CM

## 2013-05-02 DIAGNOSIS — Z8543 Personal history of malignant neoplasm of ovary: Secondary | ICD-10-CM | POA: Insufficient documentation

## 2013-05-02 DIAGNOSIS — I10 Essential (primary) hypertension: Secondary | ICD-10-CM | POA: Insufficient documentation

## 2013-05-02 HISTORY — PX: ABDOMINAL AORTAGRAM: SHX5706

## 2013-05-02 HISTORY — PX: ABDOMINAL AORTAGRAM: SHX5454

## 2013-05-02 LAB — CBC
HEMATOCRIT: 39.8 % (ref 36.0–46.0)
HEMOGLOBIN: 13.7 g/dL (ref 12.0–15.0)
MCH: 30.4 pg (ref 26.0–34.0)
MCHC: 34.4 g/dL (ref 30.0–36.0)
MCV: 88.2 fL (ref 78.0–100.0)
Platelets: 189 10*3/uL (ref 150–400)
RBC: 4.51 MIL/uL (ref 3.87–5.11)
RDW: 12.9 % (ref 11.5–15.5)
WBC: 6.8 10*3/uL (ref 4.0–10.5)

## 2013-05-02 LAB — POCT I-STAT, CHEM 8
BUN: 18 mg/dL (ref 6–23)
CALCIUM ION: 1.21 mmol/L (ref 1.13–1.30)
CHLORIDE: 97 meq/L (ref 96–112)
Creatinine, Ser: 1 mg/dL (ref 0.50–1.10)
GLUCOSE: 106 mg/dL — AB (ref 70–99)
HEMATOCRIT: 46 % (ref 36.0–46.0)
Hemoglobin: 15.6 g/dL — ABNORMAL HIGH (ref 12.0–15.0)
POTASSIUM: 3.6 meq/L — AB (ref 3.7–5.3)
Sodium: 138 mEq/L (ref 137–147)
TCO2: 27 mmol/L (ref 0–100)

## 2013-05-02 LAB — PROTIME-INR
INR: 0.96 (ref 0.00–1.49)
Prothrombin Time: 12.6 seconds (ref 11.6–15.2)

## 2013-05-02 SURGERY — ABDOMINAL AORTAGRAM
Anesthesia: LOCAL

## 2013-05-02 MED ORDER — CLOPIDOGREL BISULFATE 75 MG PO TABS: 75.0000 mg | ORAL_TABLET | Freq: Every day | ORAL | Status: DC

## 2013-05-02 MED ORDER — SODIUM CHLORIDE 0.9 % IV SOLN
INTRAVENOUS | Status: DC
  Administered 2013-05-02: 08:00:00 via INTRAVENOUS

## 2013-05-02 MED ORDER — OXYCODONE HCL 5 MG PO TABS: 5.0000 mg | ORAL_TABLET | ORAL | Status: DC | PRN

## 2013-05-02 MED ORDER — LABETALOL HCL 5 MG/ML IV SOLN: 10.0000 mg | INTRAVENOUS | Status: DC | PRN

## 2013-05-02 MED ORDER — HEPARIN (PORCINE) IN NACL 2-0.9 UNIT/ML-% IJ SOLN
INTRAMUSCULAR | Status: AC
  Filled 2013-05-02: qty 1000

## 2013-05-02 MED ORDER — FENTANYL CITRATE 0.05 MG/ML IJ SOLN
INTRAMUSCULAR | Status: AC
  Filled 2013-05-02: qty 2

## 2013-05-02 MED ORDER — CLOPIDOGREL BISULFATE 300 MG PO TABS: 300.0000 mg | ORAL_TABLET | Freq: Once | ORAL | Status: DC

## 2013-05-02 MED ORDER — ACETAMINOPHEN 325 MG PO TABS
325.0000 mg | ORAL_TABLET | ORAL | Status: DC | PRN
  Filled 2013-05-02: qty 2

## 2013-05-02 MED ORDER — ACETAMINOPHEN 325 MG RE SUPP
325.0000 mg | RECTAL | Status: DC | PRN
  Filled 2013-05-02: qty 2

## 2013-05-02 MED ORDER — LIDOCAINE HCL (PF) 1 % IJ SOLN
INTRAMUSCULAR | Status: AC
  Filled 2013-05-02: qty 30

## 2013-05-02 MED ORDER — ONDANSETRON HCL 4 MG/2ML IJ SOLN: 4.0000 mg | Freq: Four times a day (QID) | INTRAMUSCULAR | Status: DC | PRN

## 2013-05-02 MED ORDER — MORPHINE SULFATE 10 MG/ML IJ SOLN: 2.0000 mg | INTRAMUSCULAR | Status: DC | PRN

## 2013-05-02 MED ORDER — SODIUM CHLORIDE 0.45 % IV SOLN
INTRAVENOUS | Status: DC
  Administered 2013-05-02: 12:00:00 via INTRAVENOUS

## 2013-05-02 MED ORDER — HYDRALAZINE HCL 20 MG/ML IJ SOLN: 10.0000 mg | INTRAMUSCULAR | Status: DC | PRN

## 2013-05-02 MED ORDER — METOPROLOL TARTRATE 1 MG/ML IV SOLN: 2.0000 mg | INTRAVENOUS | Status: DC | PRN

## 2013-05-02 MED ORDER — CLOPIDOGREL BISULFATE 300 MG PO TABS
ORAL_TABLET | ORAL | Status: AC
  Filled 2013-05-02: qty 1

## 2013-05-02 NOTE — Research (Signed)
EndomaxInformed Consent   Subject Name: SUZZETTE GASPARRO  Subject met inclusion and exclusion criteria.  The informed consent form, study requirements and expectations were reviewed with the subject and questions and concerns were addressed prior to the signing of the consent form.  The subject verbalized understanding of the trail requirements.  The subject agreed to participate in the Endomax trial and signed the informed consent.  The informed consent was obtained prior to performance of any protocol-specific procedures for the subject.  A copy of the signed informed consent was given to the subject and a copy was placed in the subject's medical record.  Sandie Ano 05/02/2013, 8:15

## 2013-05-02 NOTE — Discharge Instructions (Signed)
Angiography, Care After ° °Refer to this sheet in the next few weeks. These instructions provide you with information on caring for yourself after your procedure. Your health care provider may also give you more specific instructions. Your treatment has been planned according to current medical practices, but problems sometimes occur. Call your health care provider if you have any problems or questions after your procedure.  °WHAT TO EXPECT AFTER THE PROCEDURE °After your procedure, it is typical to have the following sensations: °· Minor discomfort or tenderness and a small bump at the catheter insertion site. The bump should usually decrease in size and tenderness within 1 to 2 weeks. °· Any bruising will usually fade within 2 to 4 weeks. °HOME CARE INSTRUCTIONS  °· You may need to keep taking blood thinners if they were prescribed for you. Only take over-the-counter or prescription medicines for pain, fever, or discomfort as directed by your health care provider. °· Do not apply powder or lotion to the site. °· Do not sit in a bathtub, swimming pool, or whirlpool for 5 to 7 days. °· You may shower 24 hours after the procedure. Remove the bandage (dressing) and gently wash the site with plain soap and water. Gently pat the site dry. °· Inspect the site at least twice daily. °· Limit your activity for the first 24 hours. Do not bend, squat, or lift anything over 10 lb (9 kg) or as directed by your health care provider. °· Do not drive home if you are discharged the day of the procedure. Have someone else drive you. Follow instructions about when you can drive or return to work. °SEEK MEDICAL CARE IF: °· You get lightheaded when standing up. °· You have drainage (other than a small amount of blood on the dressing). °· You have chills. °· You have a fever. °· You have redness, warmth, swelling, or pain at the insertion site. °SEEK IMMEDIATE MEDICAL CARE IF:  °· You develop chest pain or shortness of breath, feel  faint, or pass out. °· You have bleeding, swelling larger than a walnut, or drainage from the catheter insertion site. °· You develop pain, discoloration, coldness, or severe bruising in the leg or arm that held the catheter. °· You have heavy bleeding from the site. If this happens, hold pressure on the site. °MAKE SURE YOU: °· Understand these instructions. °· Will watch your condition. °· Will get help right away if you are not doing well or get worse. °Document Released: 09/15/2004 Document Revised: 10/30/2012 Document Reviewed: 07/22/2012 °ExitCare® Patient Information ©2014 [employer]. ° °

## 2013-05-02 NOTE — Interval H&P Note (Signed)
History and Physical Interval Note:  05/02/2013 7:24 AM  Misty Patterson  has presented today for surgery, with the diagnosis of PVD  The various methods of treatment have been discussed with the patient and family. After consideration of risks, benefits and other options for treatment, the patient has consented to  Procedure(s): ABDOMINAL AORTAGRAM (N/A) as a surgical intervention .  The patient's history has been reviewed, patient examined, no change in status, stable for surgery.  I have reviewed the patient's chart and labs.  Questions were answered to the patient's satisfaction.     Misty Patterson E

## 2013-05-02 NOTE — Op Note (Signed)
Procedure: Aortogram with bilateral lower extremity runoff, right popliteal artery stent, ultrasound  Preoperative diagnosis: Claudication right leg  Postoperative diagnosis: Same  Anesthesia: Local with IV sedation  Operative findings: #1 6 x 8 mm Cordis self-expanding stent right above-knee popliteal artery                                 #2 left groin puncture 7 French sheath                                 #3 two-vessel runoff peroneal posterior tibial right and left foot                                 #4 in-line flow left lower extremity peroneal posterior tibial runoff #5 aborted Angiomax versus heparin study secondary to low ACT  Operative details: After obtaining informed consent, the patient was taken to the [facility] lab. The patient was placed in supine position the Angio table. Both groins were prepped and draped in usual sterile fashion. Local anesthesia was infiltrated over the left common femoral artery. Ultrasound she in the left common femoral artery. An introducer needle was then used to cannulate the left common femoral artery under ultrasound guidance. Next an 035 Versacore wire was threaded up in the abdominal aorta under fluoroscopic guidance. A 5 French pigtail catheter was then placed over the guidewire into the abdominal aorta. An abdominal aortogram was obtained. The infrarenal abdominal aorta is patent. The left and right renal arteries are patent. The left and right common external and internal arteries are patent. Next the catheter was pulled down to the aortic bifurcation and bilateral lower extremity runoffs were obtained.  In the right lower extremity, the right common femoral and profunda femoris artery is patent. The right superficial femoral artery is patent. The right popliteal artery is occluded at its origin of the adductor hiatus. The popliteal artery then reconstitutes at about the level of the femoral condyle. The below-knee popliteal artery is patent. The anterior  tibial artery is occluded. The peroneal artery is patent but severely diseased distally. Primary runoff vessel is by the posterior tibial artery.  In the left lower extremity, the left common femoral profunda femoris artery is patent. The left superficial femoral artery and popliteal arteries are patent. There is two-vessel runoff to the left foot similar to the right.  At this point, to get better views of the right popliteal artery occlusion the 5 French pigtail catheter was removed over guidewire exchanged for a 5 [place] crossover catheter. His used to selectively catheterize the right common iliac artery. The 035 versacore wire was then threaded down into the distal right external iliac artery. Crossover catheter was removed over guidewire and exchanged for a 5 French straight catheter. Additional views of the distal thigh were obtained which showed the lesion was partially 6.6 cm in length with a vessel diameter of approximately 4 mm.  At this point it was decided to intervene on the right popliteal artery occlusion.  The 5 French straight catheter was removed over the versacore wire. The 5 French sheath was then removed and exchanged for a 7 [place] Terumo sheath. This was advanced up to the aortic bifurcation but would not crossover the bifurcation. Therefore the 5 French straight catheter was advanced back over  the versacore wire to the distal right external iliac artery. The versacore wire was then removed and exchanged for an 035 Amplatz wire to splay out the aortic bifurcation. The 7 French sheath an easily cross the aortic bifurcation and was advanced all the way into the right common femoral artery. The Amplatz wire was advanced into the mid right superficial femoral artery. A cross catheter was then advanced over this. An 035 angled Glidewire was then brought in operative field and advanced to the tip of the crossover catheter. The patient was then randomized for the Angiomax versus heparin study.  The study drug was administered as well as an additional bolus at the ACT was still less than 180. At this point the study was aborted. It was found that the patient had been given a total of 6000 units of heparin were for 40 minutes. The patient was given a bolus of 7000 units of heparin at this point. The ACT was confirmed to be about 250. The Glidewire was then advanced to the proximal portion of the popliteal occlusion. The wire was then refluxed back on itself and pushed forward. Using repeated attempts of this reflux and straightening technique in the proximal catheter for support the lesion was crossed. Cross catheter was advanced into the distal popliteal artery. Contrast angiogram was performed to confirm her back in the lumen. At this point the Glidewire was removed and exchanged for an 035 versacore wire. A 5 mm x 8 mm balloon was brought in operative field and advanced to the level of the lesion. This was then advanced and centered on lesion and inflated to 8 atmospheres for 30 seconds. An avid absolute Pro self expanding stent was then advanced over the wire but the delivery system was too long for the wire. Therefore the absolute Pro stent was removed. A Cordis self-expanding stent was brought in operative field and this was a 6 mm x 8 cm in length stent. This was advanced to the level of the lesion. This was then deployed fully. Stent was then post dilated with a 6 mm x 8 cm balloon for 30 seconds just below nominal pressure. A completion arteriogram was obtained this showed a widely patent above-knee popliteal stent with preserved two-vessel runoff to the right foot. The sheath was then pulled back into the left pelvis over a guidewire. The patient tolerated the procedure well and there were complications. The patient was taken to the holding area in stable condition.  Ruta Hinds, MD [facility] Office: [phone] Pager: [phone]

## 2013-05-02 NOTE — Research (Signed)
[facility] Informed Consent   Subject Name: Misty Patterson  Subject met inclusion and exclusion criteria.  The informed consent form, study requirements and expectations were reviewed with the subject and questions and concerns were addressed prior to the signing of the consent form.  The subject verbalized understanding of the trail requirements.  The subject agreed to participate in the [facility] trial and signed the informed consent.  The informed consent was obtained prior to performance of any protocol-specific procedures for the subject.  A copy of the signed informed consent was given to the subject and a copy was placed in the subject's medical record.  Sandie Ano 05/02/2013, 08:15am

## 2013-05-02 NOTE — H&P (View-Only) (Signed)
[facility] [place] HISTORY AND PHYSICAL   History of Present Illness:  Patient is a 80 y.o. year old female who presents for evaluation of right leg pain. She currently experiences tightness and pain in her right calf after walking one block. She started developing a symptoms after breaking her toe approximately one year ago. She has had bilateral saphenous vein stripping. She denies rest pain. She denies nonhealing wounds.  Other medical problems include past history of ovarian cancer and hypertension both of which are controlled. Otherwise overall she is fairly healthy and performed most of her tasks of daily living independently. She does not smoke.  Past Medical History  Diagnosis Date  . Cancer     ovarian    Past Surgical History  Procedure Laterality Date  . Vein surgery  1950    Social History History  Substance Use Topics  . Smoking status: Never Smoker   . Smokeless tobacco: Never Used  . Alcohol Use: No    Family History History reviewed. No pertinent family history.  Allergies  No Known Allergies   Current Outpatient Prescriptions  Medication Sig Dispense Refill  . amLODipine (NORVASC) 5 MG tablet Take 1 tablet by mouth daily.      . atenolol (TENORMIN) 25 MG tablet Take 12.5 mg by mouth daily.      . diazepam (VALIUM) 5 MG tablet Take 1 tablet by mouth as needed.      . famotidine (PEPCID) 20 MG tablet Take 1 tablet by mouth daily.      . hydrochlorothiazide (HYDRODIURIL) 25 MG tablet Take 0.5 tablets by mouth daily.      . HYDROcodone-acetaminophen (NORCO/VICODIN) 5-325 MG per tablet Take 1 tablet by mouth as needed.      . sertraline (ZOLOFT) 25 MG tablet Take 1 tablet by mouth daily.      . sulfamethoxazole-trimethoprim (BACTRIM DS) 800-160 MG per tablet Take 1 tablet by mouth 2 (two) times daily.      . traZODone (DESYREL) 50 MG tablet Take 1 tablet by mouth daily.       No current facility-administered medications for this visit.     ROS:   General:  No weight loss, Fever, chills  HEENT: No recent headaches, no nasal bleeding, no visual changes, no sore throat  Neurologic: No dizziness, blackouts, seizures. No recent symptoms of stroke or mini- stroke. No recent episodes of slurred speech, or temporary blindness.  Cardiac: No recent episodes of chest pain/pressure, no shortness of breath at rest.  No shortness of breath with exertion.  Denies history of atrial fibrillation or irregular heartbeat  Vascular: No history of rest pain in feet.  + history of claudication.  No history of non-healing ulcer, No history of DVT   Pulmonary: No home oxygen, no productive cough, no hemoptysis,  No asthma or wheezing  Musculoskeletal:  [ ] Arthritis, [ ] Low back pain,  [ ] Joint pain  Hematologic:No history of hypercoagulable state.  No history of easy bleeding.  No history of anemia  Gastrointestinal: No hematochezia or melena,  No gastroesophageal reflux, no trouble swallowing  Urinary: [ ] chronic Kidney disease, [ ] on HD - [ ] MWF or [ ] TTHS, [ ] Burning with urination, [ ] Frequent urination, [ ] Difficulty urinating;   Skin: No rashes  Psychological: No history of anxiety,  No history of depression   Physical Examination  Filed Vitals:   04/03/13 1000  BP: 133/72  Pulse: 73    Weight: 146 lb (66.225 kg)  SpO2: 100%    There is no height on file to calculate BMI.  General:  Alert and oriented, no acute distress HEENT: Normal Neck: No bruit or JVD Pulmonary: Clear to auscultation bilaterally Cardiac: Regular Rate and Rhythm without murmur Abdomen: Soft, non-tender, non-distended, no mass, no scars Skin: No rash Extremity Pulses:  2+ radial, brachial, femoral, right dorsalis pedis, absent left dorsalis pedis and posterior tibial pulse Musculoskeletal: No deformity or edema  Neurologic: Upper and lower extremity motor 5/5 and symmetric  DATA:  Patient had bilateral ABIs on January 7 which I reviewed  today. She ABI left 0.9 a with triphasic waveforms right was 0.49 monophasic waveforms   ASSESSMENT:  Right lower extremity claudication. I discussed with the patient that currently she does not have limb threatening ischemia. I discussed with her the possibility of a walking program and conservative management versus intervention. At this point she has opted for aortogram possible percutaneous intervention. Her arteriogram is scheduled for February 20. Risks benefits possible complications and procedure details including but limited to bleeding infection contrast reaction vessel injury were explained the patient today. She understands and agrees to proceed.   PLAN:  See above  Ruta Hinds, MD [facility] [place] Office: [phone] Pager: [phone]

## 2013-05-05 ENCOUNTER — Telehealth: Payer: Self-pay

## 2013-05-05 ENCOUNTER — Other Ambulatory Visit: Payer: Self-pay | Admitting: *Deleted

## 2013-05-05 DIAGNOSIS — I739 Peripheral vascular disease, unspecified: Secondary | ICD-10-CM

## 2013-05-05 DIAGNOSIS — Z959 Presence of cardiac and vascular implant and graft, unspecified: Secondary | ICD-10-CM

## 2013-05-05 DIAGNOSIS — Z48812 Encounter for surgical aftercare following surgery on the circulatory system: Secondary | ICD-10-CM

## 2013-05-05 LAB — POCT ACTIVATED CLOTTING TIME
Activated Clotting Time: 293 seconds
Activated Clotting Time: 238 seconds
Activated Clotting Time: 204 seconds
Activated Clotting Time: 171 seconds

## 2013-05-05 MED ORDER — CLOPIDOGREL BISULFATE 75 MG PO TABS: 75.0000 mg | ORAL_TABLET | Freq: Every day | ORAL | Status: DC

## 2013-05-05 NOTE — Telephone Encounter (Signed)
Phone call from pt. Questioned if she should be on a "blood-thinner" following her procedure recently?  Discussed with Dr. Oneida Alar.  Recommended to start Plavix 75 mg, po., daily.  Notified pt. Of Dr. Oneida Alar recommendation; will order medication at her pharmacy.  Advised to start today, and take it the same time everyday.  Verb. Understanding.

## 2013-05-07 ENCOUNTER — Telehealth: Payer: Self-pay | Admitting: Vascular Surgery

## 2013-05-07 NOTE — Telephone Encounter (Signed)
Misty Patterson  Aortogram with bilat lower extremity runoff. Right popliteal artery stent. Ultrasound. She needs a follow up appt in 2 weeks with ABI and see Vinnie Level then put on every 3 month protocol.   05/07/13: spoke with pt to schedule, dpm

## 2013-05-22 ENCOUNTER — Encounter: Payer: Self-pay | Admitting: Family

## 2013-05-23 ENCOUNTER — Encounter: Payer: Self-pay | Admitting: Family

## 2013-05-23 ENCOUNTER — Ambulatory Visit (INDEPENDENT_AMBULATORY_CARE_PROVIDER_SITE_OTHER): Payer: Medicare Other | Admitting: Family

## 2013-05-23 ENCOUNTER — Ambulatory Visit (HOSPITAL_COMMUNITY)
Admission: RE | Admit: 2013-05-23 | Discharge: 2013-05-23 | Disposition: A | Discharge status: Home / Self Care | Payer: Medicare Other | Source: Ambulatory Visit | Attending: Family | Admitting: Family

## 2013-05-23 VITALS — BP 160/83 | HR 55 | Resp 16 | Ht 65.5 in | Wt 148.0 lb

## 2013-05-23 DIAGNOSIS — Z48812 Encounter for surgical aftercare following surgery on the circulatory system: Secondary | ICD-10-CM | POA: Insufficient documentation

## 2013-05-23 DIAGNOSIS — R253 Fasciculation: Secondary | ICD-10-CM | POA: Insufficient documentation

## 2013-05-23 DIAGNOSIS — I739 Peripheral vascular disease, unspecified: Secondary | ICD-10-CM

## 2013-05-23 DIAGNOSIS — R259 Unspecified abnormal involuntary movements: Secondary | ICD-10-CM

## 2013-05-23 NOTE — Progress Notes (Signed)
[facility] HISTORY AND PHYSICAL -PAD  History of Present Illness Misty Patterson is a 80 y.o. female patient of Dr. Oneida Alar who had a right popliteal stent placed on 05/02/2013. She returns today for surveillance. She can walk now without claudication since the right popliteal stent placed. She denies non healing wounds. She denies history of stroke, TIA, or CAD. Injured in October, 2013. The patient denies New Medical or Surgical History.  Pt Diabetic: No Pt smoker: non-smoker, lived with a smoker, second hand exposure  Pt meds include: Statin :No, states her cholesterol is being checked ASA: No Other anticoagulants/antiplatelets: Plavix  Past Medical History  Diagnosis Date  . Cancer     ovarian  . Peripheral vascular disease   . Restless leg syndrome   . Fall Oct. 13, 2013    [address] in store- from [place] History History  Substance Use Topics  . Smoking status: Never Smoker   . Smokeless tobacco: Never Used  . Alcohol Use: No    Family History Family History  Problem Relation Age of Onset  . Hypertension Mother     Past Surgical History  Procedure Laterality Date  . Vein surgery  1950  . Abdominal aortagram  Feb. 20, 2015    Allergies  Allergen Reactions  . Aspirin Other (See Comments)    REACTION:  Choking sensation  . Formaldehyde Swelling    Tongue swelling    Current Outpatient Prescriptions  Medication Sig Dispense Refill  . amLODipine (NORVASC) 5 MG tablet Take 5 mg by mouth daily.      Marland Kitchen atenolol (TENORMIN) 25 MG tablet Take 12.5 mg by mouth daily.      . B Complex-Biotin-FA (B-COMPLEX PO) Take 1 tablet by mouth daily.      . clopidogrel (PLAVIX) 75 MG tablet Take 1 tablet (75 mg total) by mouth daily.  30 tablet  6  . Cyanocobalamin (B-12 PO) Take 1 tablet by mouth daily.      . diazepam (VALIUM) 5 MG tablet Take 5 mg by mouth daily as needed for anxiety or sedation.      . diphenhydrAMINE (BENADRYL) 25  MG tablet Take 25 mg by mouth at bedtime as needed for sleep.      . famotidine (PEPCID) 20 MG tablet Take 20 mg by mouth daily.      . hydrochlorothiazide (HYDRODIURIL) 25 MG tablet Take 12.5 mg by mouth daily.       Marland Kitchen HYDROcodone-acetaminophen (NORCO/VICODIN) 5-325 MG per tablet Take 1 tablet by mouth 2 (two) times daily as needed for moderate pain.      . Omega-3 Fatty Acids (FISH OIL PO) Take 2 capsules by mouth daily.      . sertraline (ZOLOFT) 25 MG tablet Take 1 tablet by mouth daily.      . traZODone (DESYREL) 50 MG tablet Take 50 mg by mouth at bedtime.      Marland Kitchen VITAMIN E PO Take 2 capsules by mouth daily.      Marland Kitchen sulfamethoxazole-trimethoprim (BACTRIM DS) 800-160 MG per tablet        No current facility-administered medications for this visit.    ROS: See HPI for pertinent positives and negatives.   Physical Examination  Filed Vitals:   05/23/13 1123  BP: 160/83  Pulse: 55  Resp: 16   Filed Weights   05/23/13 1123  Weight: 148 lb (67.132 kg)   Body mass index is 24.25 kg/(m^2).  General: A&O  x 3, WDWN. Gait: normal Eyes: PERRLA. Pulmonary: CTAB, without wheezes , rales or rhonchi. Cardiac: regular Rythm with occasional premature beats, without detected murmur.         Carotid Bruits Left Right   Negative Negative  Aorta is not palpable. Radial pulses: are 2+ palpable and =                           VASCULAR EXAM: Extremities without ischemic changes  without Gangrene; without open wounds.                                                                                                          LE Pulses LEFT RIGHT       FEMORAL  not palpable   palpable        POPLITEAL  not palpable   not palpable       POSTERIOR TIBIAL  not palpable    palpable        DORSALIS PEDIS      ANTERIOR TIBIAL  palpable  not palpable    Abdomen: soft, NT, no masses. Skin: no rashes, no ulcers noted. Musculoskeletal: no muscle wasting or atrophy.  Neurologic: A&O X 3;  Appropriate Affect ; SENSATION: normal; MOTOR FUNCTION:  moving all extremities equally, motor strength 5/5 throughout. Speech is fluent/normal. CN 2-12 intact.    Non-Invasive Vascular Imaging: DATE: 05/23/2013 ABI: RIGHT 1.06, Waveforms: biphasic;  LEFT 1.04, Waveforms: triphasic  Previous (03/29/2013) Right: 0.49, Left: 0.98  ASSESSMENT: Misty Patterson is a 80 y.o. female who is s/p right popliteal stent placed on 05/02/2013 and presents with improved ABI form severe arterial occlusive disease to no evidence of arterial occlusive disease in right leg since the right popliteal stent placed, left leg ABI remains stable and normal.   PLAN:  I discussed in depth with the patient the nature of atherosclerosis, and emphasized the importance of maximal medical management including strict control of blood pressure, blood glucose, and lipid levels, obtaining regular exercise, and continued cessation of smoking.  The patient is aware that without maximal medical management the underlying atherosclerotic disease process will progress, limiting the benefit of any interventions.  Based on the patient's vascular studies and examination, pt will return to clinic in 3 months for RLE arterial Duplex and ABI's.  The patient was given information about PAD including signs, symptoms, treatment, what symptoms should prompt the patient to seek immediate medical care, and risk reduction measures to take.  Clemon Chambers, RN, MSN, FNP-C [facility] Phone: [phone]  Clinic MD: Bridgett Larsson  05/23/2013 11:55 AM

## 2013-05-26 NOTE — Addendum Note (Signed)
Addended by: Mena Goes on: 05/26/2013 04:26 PM   Modules accepted: Orders

## 2013-07-09 ENCOUNTER — Other Ambulatory Visit: Payer: Self-pay

## 2013-07-09 ENCOUNTER — Ambulatory Visit
Admission: RE | Admit: 2013-07-09 | Discharge: 2013-07-09 | Disposition: A | Discharge status: Home / Self Care | Payer: Medicare Other | Source: Ambulatory Visit

## 2013-07-09 ENCOUNTER — Encounter (INDEPENDENT_AMBULATORY_CARE_PROVIDER_SITE_OTHER): Payer: Self-pay

## 2013-07-09 ENCOUNTER — Other Ambulatory Visit: Payer: Self-pay | Admitting: Internal Medicine

## 2013-07-09 DIAGNOSIS — Z1231 Encounter for screening mammogram for malignant neoplasm of breast: Secondary | ICD-10-CM

## 2013-07-09 DIAGNOSIS — R1310 Dysphagia, unspecified: Secondary | ICD-10-CM

## 2013-07-15 ENCOUNTER — Ambulatory Visit
Admission: RE | Admit: 2013-07-15 | Discharge: 2013-07-15 | Disposition: A | Discharge status: Home / Self Care | Payer: Medicare Other | Source: Ambulatory Visit | Attending: Internal Medicine | Admitting: Internal Medicine

## 2013-07-15 ENCOUNTER — Encounter (INDEPENDENT_AMBULATORY_CARE_PROVIDER_SITE_OTHER): Payer: Self-pay

## 2013-07-15 DIAGNOSIS — R1310 Dysphagia, unspecified: Secondary | ICD-10-CM

## 2013-08-22 ENCOUNTER — Encounter (HOSPITAL_COMMUNITY): Payer: Medicare Other

## 2013-08-22 ENCOUNTER — Ambulatory Visit: Payer: Medicare Other | Admitting: Family

## 2013-08-22 ENCOUNTER — Other Ambulatory Visit (HOSPITAL_COMMUNITY): Payer: Medicare Other

## 2013-08-25 ENCOUNTER — Ambulatory Visit: Payer: Medicare Other | Admitting: Family

## 2013-08-25 ENCOUNTER — Encounter (HOSPITAL_COMMUNITY): Payer: Medicare Other

## 2013-08-25 ENCOUNTER — Other Ambulatory Visit (HOSPITAL_COMMUNITY): Payer: Medicare Other

## 2013-09-03 ENCOUNTER — Encounter: Payer: Self-pay | Admitting: Family

## 2013-09-04 ENCOUNTER — Ambulatory Visit (INDEPENDENT_AMBULATORY_CARE_PROVIDER_SITE_OTHER): Payer: Medicare Other | Admitting: Family

## 2013-09-04 ENCOUNTER — Encounter: Payer: Self-pay | Admitting: Family

## 2013-09-04 ENCOUNTER — Ambulatory Visit (INDEPENDENT_AMBULATORY_CARE_PROVIDER_SITE_OTHER)
Admission: RE | Admit: 2013-09-04 | Discharge: 2013-09-04 | Disposition: A | Discharge status: Home / Self Care | Payer: Medicare Other | Source: Ambulatory Visit | Attending: Family | Admitting: Family

## 2013-09-04 ENCOUNTER — Ambulatory Visit (HOSPITAL_COMMUNITY)
Admission: RE | Admit: 2013-09-04 | Discharge: 2013-09-04 | Disposition: A | Discharge status: Home / Self Care | Payer: Medicare Other | Source: Ambulatory Visit | Attending: Family | Admitting: Family

## 2013-09-04 VITALS — BP 193/90 | HR 65 | Resp 16 | Ht 65.5 in | Wt 142.0 lb

## 2013-09-04 DIAGNOSIS — I739 Peripheral vascular disease, unspecified: Secondary | ICD-10-CM

## 2013-09-04 DIAGNOSIS — Z48812 Encounter for surgical aftercare following surgery on the circulatory system: Secondary | ICD-10-CM

## 2013-09-04 DIAGNOSIS — M79609 Pain in unspecified limb: Secondary | ICD-10-CM

## 2013-09-04 DIAGNOSIS — M79661 Pain in right lower leg: Secondary | ICD-10-CM | POA: Insufficient documentation

## 2013-09-04 DIAGNOSIS — R202 Paresthesia of skin: Secondary | ICD-10-CM

## 2013-09-04 DIAGNOSIS — R209 Unspecified disturbances of skin sensation: Secondary | ICD-10-CM

## 2013-09-04 NOTE — Progress Notes (Signed)
[facility] [place] HISTORY AND PHYSICAL -PAD  History of Present Illness Misty Patterson is a 80 y.o. female patient of Dr. Oneida Alar who had a right popliteal stent placed on 05/02/2013.  She returns today for surveillance.  Her right calf feels weak after walking about 1/8 mile, also her back and arms "gives out". She denies non healing wounds.  She denies history of stroke, TIA, or CAD.  Injured her right toes and hands in October, 2013 with a fall.  She tires easily since this fall, but she is always walking The patient denies New Medical or Surgical History.  She has diaphragmatic chest pain intermittently for the last year, occasional dyspnea, last saw her PCP about 6 months ago, states her blood pressure has been high since she fell.  Pt Diabetic: No  Pt smoker: non-smoker, lived with a smoker, second hand exposure   Pt meds include:  Statin :No, states her cholesterol is being checked  ASA: No  Other anticoagulants/antiplatelets: Plavix   Past Medical History  Diagnosis Date  . Cancer     ovarian  . Peripheral vascular disease   . Restless leg syndrome   . Fall Oct. 13, 2013    [address] in store- from [place] History History  Substance Use Topics  . Smoking status: Never Smoker   . Smokeless tobacco: Never Used  . Alcohol Use: No    Family History Family History  Problem Relation Age of Onset  . Hypertension Mother     Past Surgical History  Procedure Laterality Date  . Vein surgery  1950  . Abdominal aortagram  Feb. 20, 2015    Allergies  Allergen Reactions  . Aspirin Other (See Comments)    REACTION:  Choking sensation  . Formaldehyde Swelling    Tongue swelling  . Sodium Pentobarbital [Pentobarbital] Other (See Comments)    Memory Loss    Current Outpatient Prescriptions  Medication Sig Dispense Refill  . amLODipine (NORVASC) 5 MG tablet Take 5 mg by mouth daily.      Marland Kitchen atenolol (TENORMIN) 25 MG tablet Take 12.5 mg  by mouth daily.      . B Complex-Biotin-FA (B-COMPLEX PO) Take 1 tablet by mouth daily.      . clopidogrel (PLAVIX) 75 MG tablet Take 1 tablet (75 mg total) by mouth daily.  30 tablet  6  . Cyanocobalamin (B-12 PO) Take 1 tablet by mouth daily.      . diazepam (VALIUM) 5 MG tablet Take 5 mg by mouth daily as needed for anxiety or sedation.      . diphenhydrAMINE (BENADRYL) 25 MG tablet Take 25 mg by mouth at bedtime as needed for sleep.      . famotidine (PEPCID) 20 MG tablet Take 20 mg by mouth daily.      . hydrochlorothiazide (HYDRODIURIL) 25 MG tablet Take 12.5 mg by mouth daily.       Marland Kitchen HYDROcodone-acetaminophen (NORCO/VICODIN) 5-325 MG per tablet Take 1 tablet by mouth 2 (two) times daily as needed for moderate pain.      . Omega-3 Fatty Acids (FISH OIL PO) Take 2 capsules by mouth daily.      Marland Kitchen PROAIR HFA 108 (90 BASE) MCG/ACT inhaler as needed.      . sertraline (ZOLOFT) 25 MG tablet Take 1 tablet by mouth daily.      Marland Kitchen sulfamethoxazole-trimethoprim (BACTRIM DS) 800-160 MG per tablet       . traZODone (DESYREL)  50 MG tablet Take 50 mg by mouth at bedtime.      Marland Kitchen VITAMIN E PO Take 2 capsules by mouth daily.       No current facility-administered medications for this visit.    ROS: See HPI for pertinent positives and negatives.   Physical Examination  Filed Vitals:   09/04/13 1554  BP: 193/90  Pulse: 65  Resp: 16  Height: 5' 5.5" (1.664 m)  Weight: 142 lb (64.411 kg)  SpO2: 99%   Body mass index is 23.26 kg/(m^2).  General: A&O x 3, WDWN.  Gait: normal  Eyes: PERRLA.  Pulmonary: CTAB, without wheezes , rales or rhonchi.  Cardiac: regular Rythm with occasional premature beats, without detected murmur.  Carotid Bruits  Left  Right    Negative  Negative   Aorta is not palpable.  Radial pulses: are 2+ palpable and =  VASCULAR EXAM:  Extremities without ischemic changes  without Gangrene; without open wounds.  LE Pulses  LEFT  RIGHT   FEMORAL  3+ palpable  3+ palpable    POPLITEAL  not palpable  not palpable   POSTERIOR TIBIAL  2+ palpable  2+palpable   DORSALIS PEDIS  ANTERIOR TIBIAL  2+palpable  2+ palpable   Abdomen: soft, NT, no masses.  Skin: no rashes, no ulcers noted.  Musculoskeletal: no muscle wasting or atrophy.  Neurologic: A&O X 3; Appropriate Affect ; SENSATION: normal; MOTOR FUNCTION: moving all extremities equally, motor strength 4/5 throughout. Speech is fluent/normal. CN 2-12 intact.    Non-Invasive Vascular Imaging: DATE: 09/04/2013 LOWER EXTREMITY ARTERIAL EVALUATION    INDICATION: Peripheral vascular disease    PREVIOUS INTERVENTION(S): Right popliteal artery stent placed 05/02/2013.    DUPLEX EXAM:     RIGHT  LEFT   Peak Systolic Velocity (cm/s) Ratio (if abnormal) Waveform  Peak Systolic Velocity (cm/s) Ratio (if abnormal) Waveform  92/164  B/B Artery - Proximal to Stent     82  B Stent - Origin        Stent - Proximal     66  B Stent - Mid        Stent - Distal     86  B  Stent - End     55/95  B/B Artery - Distal to Stent     0.99/0.66 Today's ABI / TBI 0.92/0.67  1.06/0.79 Previous ABI / TBI (05/23/2013  ) 1.04/0.85    Waveform:    M - Monophasic       B - Biphasic       T - Triphasic  If Ankle Brachial Index (ABI) or Toe Brachial Index (TBI) performed, please see complete report     ADDITIONAL FINDINGS:     IMPRESSION: Right arterial inflow is within normal limits. Right above knee popliteal artery stent is patent, no hyperplasia or hemodynamic changes present. Right arterial outflow is multiphasic and considered normal.    Compared to the previous exam:  Stable ankle brachial indices and decreased toe brachial indices since study on 05/23/2013.     ASSESSMENT: Misty Patterson is a 80 y.o. female who is s/p right popliteal stent placed on 05/02/2013.  Made an appointment with her PCP, Dr. Lorenda Hatchet, for 9 AM tomorrow to address her hypertension.  Weakness in legs, occasional low back pain, generalized fatigue,  and hypertension since she fell in October, 2013 and injured her hands and right toes. ABI's remain normal in both legs, right popliteal stent is patent. With normal arterial perfusion to both  ankles, the weakness in her legs which began with her fall in October, 2013,  is from another source; referred to Dr. Erline Levine for neurosurgery evaluation.   PLAN:  I discussed in depth with the patient the nature of atherosclerosis, and emphasized the importance of maximal medical management including strict control of blood pressure, blood glucose, and lipid levels, obtaining regular exercise, and continued cessation of smoking.  The patient is aware that without maximal medical management the underlying atherosclerotic disease process will progress, limiting the benefit of any interventions.  Based on the patient's vascular studies and examination, pt will return to clinic in 3 months for ABI's and right LE arterial Duplex.  The patient was given information about PAD including signs, symptoms, treatment, what symptoms should prompt the patient to seek immediate medical care, and risk reduction measures to take.  Clemon Chambers, RN, MSN, FNP-C [facility] Phone: [phone]  Clinic MD: Oneida Alar  09/04/2013 3:52 PM

## 2013-09-04 NOTE — Patient Instructions (Signed)

## 2013-10-09 ENCOUNTER — Encounter: Payer: Self-pay | Admitting: *Deleted

## 2013-10-10 ENCOUNTER — Ambulatory Visit (INDEPENDENT_AMBULATORY_CARE_PROVIDER_SITE_OTHER): Payer: Medicare Other | Admitting: Neurology

## 2013-10-10 ENCOUNTER — Encounter: Payer: Self-pay | Admitting: Neurology

## 2013-10-10 VITALS — BP 124/70 | HR 76 | Ht 65.75 in | Wt 141.4 lb

## 2013-10-10 DIAGNOSIS — M5412 Radiculopathy, cervical region: Secondary | ICD-10-CM

## 2013-10-10 DIAGNOSIS — M542 Cervicalgia: Secondary | ICD-10-CM

## 2013-10-10 DIAGNOSIS — G609 Hereditary and idiopathic neuropathy, unspecified: Secondary | ICD-10-CM

## 2013-10-10 MED ORDER — GABAPENTIN 100 MG PO CAPS: ORAL_CAPSULE | ORAL | Status: DC

## 2013-10-10 NOTE — Patient Instructions (Addendum)
1.  Start gabapentin 100mg  at bedtime for one week, if tolerating, increase to 1 tablet twice daily 2.  MRI cervical spine 3.  Check blood work 4.  Follow-up with Dr. Lysle Rubens for shortness of breath 5.  Return to clinic 4-6 weeks  [facility]  Preventing Falls in the Home   Falls are common, often dreaded events in the lives of older people. Aside from the obvious injuries and even death that may result, falls can cause wide-ranging consequences including loss of independence, mental decline, decreased activity, and mobility. Younger people are also at risk of falling, especially those with chronic illnesses and fatigue.  Ways to reduce the risk for falling:  * Examine diet and medications. Warm foods and alcohol dilate blood vessels, which can lead to dizziness when standing. Sleep aids, antidepressants, and pain medications can also increase the likelihood of a fall.  * Get a vison exam. Poor vision, cataracts, and glaucoma increase the chances of falling.  * Check foot gear. Shoes should fit snugly and have a sturdy, nonskid sole and broad, low heel.  * Participate in a physician-approved exercise program to build and maintain muscle strength and improve balance and coordination.  * Increase vitamin D intake. Vitamin D improves muscle strength and increases the amount of calcium the body is able to absorb and deposit in bones.  How to prevent falls from common hazards:  * Floors - Remove all loose wires, cords, and throw rugs. Minimize clutter. Make sure rugs are anchored and smooth. Keep furniture in its usual place.  * Chairs - Use chairs with straight backs, armrests, and firm seats. Add firm cushions to existing pieces to add height.  * Bathroom - Install grab bars and non-skid tape in the tub or shower. Use a bathtub transfer bench or a shower chair with a back support. Use an elevated toilet seat and/or safety rails to assist standing from a low surface. Do not use towel racks or  bathroom tissue holders to help you stand.  * Lighting - Make sure halls, stairways, and entrances are well-lit. Install a night light in your bathroom or hallway. Make sure there is a light switch at the top and bottom of the staircase. Turn lights on if you get up in the middle of the night. Make sure lamps or light switches are within reach of the bed if you have to get up during the night.  * Kitchen - Install non-skid rubber mats near the sink and stove. Clean spills immediately. Store frequently used utensils, pots, and pans between waist and eye level. This helps prevent reaching and bending. Sit when getting things out of the lower cupboards.  * Living room / [place] furniture with wide spaces in between, giving enough room to move around. Establish a route through the living room that gives you something to hold onto as you walk.  * Stairs - Make sure treads, rails, and rugs are secure. Install a rail on both sides of the stairs. If stairs are a threat, it might be helpful to arrange most of your activities on the lower level to reduce the number of times you must climb the stairs.  * Entrances and doorways - Install metal handles on the walls adjacent to the doorknobs of all doors to make it more secure as you travel through the doorway.  Tips for maintaining balance:  * Keep at least one hand free at all times Try using a backpack or fanny pack  to hold things rather than carrying them in your hands. Never carry objects in both hands when walking as this interferes with keeping your balance.  * Attempt to swing both arms from front to back while walking. This might require a conscious effort if Parkinson's disease has diminished your movement. It will, however, help you to maintain balance and posture, and reduce fatigue.  * Consciously lift your feet off the ground when walking. Shuffling and dragging of the feet is a common culprit in losing your balance.  * When trying to navigate  turns, use a "U" technique of facing forward and making a wide turn, rather than pivoting sharply.  * Try to stand with your feet shoulder-length apart. When your feet are close together for any length of time, you increase your risk of losing your balance and falling.  * Do one thing at a time. Do not try to walk and accomplish another task, such as reading or looking around. The decrease in your automatic reflexes complicates motor function, so the less distraction, the better.  * Do not wear rubber or gripping soled shoes, they might "catch" on the floor and cause tripping.  * Move slowly when changing positions. Use deliberate, concentrated movements and, if needed, use a grab bar or walking aid. Count fifteen (15) seconds after standing to begin walking.  * If balance is a continuous problem, you might want to consider a walking aid such as a cane, walking stick, or walker. Once you have mastered walking with help, you may be ready to try it again on your own.  This information is provided by [facility] and is not intended to replace the medical advice of your physician or other health care providers. Please consult your physician or other health care providers for advice regarding your specific medical condition.

## 2013-10-10 NOTE — Progress Notes (Signed)
[facility] Clinic Note - Initial Visit   Date: 10/10/2013  Misty Patterson MRN: [id number] DOB: 1926-12-23   Dear Dr. Lysle Rubens:  Thank you for your kind referral of Misty Patterson for consultation of neuropathy. Although her history is well known to you, please allow [place] to reiterate it for the purpose of our medical record. The patient was accompanied to the clinic by self.    History of Present Illness: Misty Patterson is a 80 y.o. right-handed Caucasian female with history of peripheral vascular disease s/p R popliteal stent placement (05/02/2013), depression, GERD, hypertension, ovarian cancer, CKD presenting for evaluation of generalized numbness and tingling of her hands and feet.    In 2012, she suffered a fall while shopping in [place] where her leg got caught by plastic covering off the racks.  Since then she has neck pain, bilateral arm achy pain, numbness/tingling of the hands.  She has seen a number of physicians since then including orthopedic surgery. She completed physical therapy which helped her arm numbness transiently. She has also underwent electrodiagnostic testing in November 2014 by Dr. Jannifer Franklin at [facility] which showed bilateral carpal tunnel syndrome, possible overlying peripheral neuropathy based on prolongation of sensory latency, and C7-8 radiculopathy on the left side.    She denies any alleviating or exacerbating factors for her paresthesias are arm pain. She also complains of right leg foot twitching only occurring at nighttime. Movements are pain does and does not have any associated crawling sensation. She denies the urge to move her legs.   She is a very active individual up until recently when her episodic weakness has limited what she is able to do. Previously, she was cutting her own wood and taking care of her garden.  Out-side paper records, electronic medical record, and images have been reviewed where available and summarized as:  CT head  wo contrast 12/12/2011:  Moderate small vessel ischemic change and atrophy. No acute intracranial abnormality.   EMG 01/28/2013: Nerve conduction studies done on both upper extremities shows evidence of a mild right carpal tunnel syndrome, and a borderline left carpal tunnel syndrome. There is diffuse prolongation of the sensory latencies in the upper extremities, and the possibility of an overlying peripheral neuropathy should be considered. The patient does report some numbness and burning sensations in the feet. EMG evaluation of the right upper extremity shows some mild distal chronic denervation consistent with a peripheral neuropathy, but no evidence of an overlying cervical radiculopathy. EMG evaluation of the left upper extremity shows findings consistent with a chronic stable C8 and C7 radiculopathy.    Past Medical History  Diagnosis Date  . Cancer     ovarian  . Peripheral vascular disease   . Restless leg syndrome   . Fall Oct. 13, 2013    [place] in store- from [employer]  . Hypertension   . GERD (gastroesophageal reflux disease)   . DVT (deep venous thrombosis)     right leg  . Varicose vein   . Anxiety   . Peripheral neuropathy   . Dyslipidemia   . Ovarian cancer   . Colon polyp   . Osteoporosis   . Incontinence of urine   . COPD (chronic obstructive pulmonary disease)   . Esophageal dysmotility   . Zenker's diverticulum     small  . Decreased GFR   . Chronic kidney disease   . Unspecified disorder of skin and subcutaneous tissue   . DJD (degenerative joint disease) of cervical  spine   . Radiculopathy   . CTS (carpal tunnel syndrome)     Past Surgical History  Procedure Laterality Date  . Vein surgery  1950  . Abdominal aortagram  Feb. 20, 2015     Medications:  Current Outpatient Prescriptions on File Prior to Visit  Medication Sig Dispense Refill  . amLODipine (NORVASC) 5 MG tablet Take 5 mg by mouth daily.      Marland Kitchen atenolol (TENORMIN) 25 MG tablet Take 12.5  mg by mouth daily.      . B Complex-Biotin-FA (B-COMPLEX PO) Take 1 tablet by mouth daily.      . Cholecalciferol (VITAMIN D) 2000 UNITS tablet Take 2,000 Units by mouth daily.      . clopidogrel (PLAVIX) 75 MG tablet Take 1 tablet (75 mg total) by mouth daily.  30 tablet  6  . Cyanocobalamin (B-12 PO) Take 1 tablet by mouth daily.      . diazepam (VALIUM) 5 MG tablet Take 5 mg by mouth daily as needed for anxiety or sedation.      . diphenhydrAMINE (BENADRYL) 25 MG tablet Take 25 mg by mouth at bedtime as needed for sleep.      Marland Kitchen estradiol (ESTRACE) 0.5 MG tablet Take 0.5 mg by mouth daily.      . famotidine (PEPCID) 20 MG tablet Take 20 mg by mouth daily.      . hydrochlorothiazide (HYDRODIURIL) 25 MG tablet Take 12.5 mg by mouth daily.       . Omega-3 Fatty Acids (FISH OIL PO) Take 2 capsules by mouth daily.      Marland Kitchen PROAIR HFA 108 (90 BASE) MCG/ACT inhaler as needed.      . sertraline (ZOLOFT) 25 MG tablet Take 1 tablet by mouth daily.      . traZODone (DESYREL) 50 MG tablet Take 50 mg by mouth at bedtime.      Marland Kitchen VITAMIN E PO Take 2 capsules by mouth daily.       No current facility-administered medications on file prior to visit.    Allergies:  Allergies  Allergen Reactions  . Aspirin Other (See Comments)    REACTION:  Choking sensation  . Formaldehyde Swelling    Tongue swelling  . Sodium Pentobarbital [Pentobarbital] Other (See Comments)    Memory Loss    Family History: Family History  Problem Relation Age of Onset  . Hypertension Mother   . Lung cancer Son     Deceased, 67  . Healthy Son   . Other Father     MVA  . Hypertension Brother   . Kidney disease Sister     Social History: History   Social History  . Marital Status: Widowed    Spouse Name: N/A    Number of Children: 3  . Years of Education: N/A   Occupational History  .     Social History Main Topics  . Smoking status: Never Smoker   . Smokeless tobacco: Never Used  . Alcohol Use: No  . Drug  Use: No  . Sexual Activity: Not on file   Other Topics Concern  . Not on file   Social History Narrative   Lives with son.   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   Highest level of education:  11th grade    Review of Systems:  CONSTITUTIONAL: No fevers, chills, night sweats, or weight loss.   EYES: No visual changes or eye pain  ENT: No hearing changes.  No history of nose bleeds.   RESPIRATORY: No cough, wheezing and shortness of breath.   CARDIOVASCULAR: Negative for chest pain, and palpitations.   GI: Negative for abdominal discomfort, blood in stools or black stools.  No recent change in bowel habits.   GU:  No history of incontinence.   MUSCLOSKELETAL: + history of joint pain or swelling.  No myalgias.   SKIN: Negative for lesions, rash, and itching.   HEMATOLOGY/ONCOLOGY: Negative for prolonged bleeding, bruising easily, and swollen nodes. + history of cancer.   ENDOCRINE: Negative for cold or heat intolerance, polydipsia or goiter.   PSYCH:  No depression or anxiety symptoms.   NEURO: As Above.   Vital Signs:  BP 124/70  Pulse 76  Ht 5' 5.75" (1.67 m)  Wt 141 lb 6 oz (64.127 kg)  BMI 22.99 kg/m2  SpO2 98%   General Medical Exam:   General:  Patient he cheerful humorous, comfortable.   Eyes/ENT: see cranial nerve examination.   Neck: No masses appreciated.  Full range of motion without tenderness.  No carotid bruits. Respiratory:  Clear to auscultation, good air entry bilaterally.   Cardiac:  Regular rate and rhythm, no murmur.   Back:  No pain to palpation of spinous processes.  Cervical muscle tension and tenderness to palpation Extremities:  No deformities, edema, or skin discoloration. Good capillary refill.   Skin:  Skin color, texture, turgor normal. No rashes or lesions.  Neurological Exam: MENTAL STATUS including orientation to time, place, person, recent and remote memory, attention span and concentration,  language, and fund of knowledge is normal.  Speech is not dysarthric.  CRANIAL NERVES: II:  No visual field defects.  Unremarkable fundi.   III-IV-VI: Pupils equal round and reactive to light.  Normal conjugate, extra-ocular eye movements in all directions of gaze.  No nystagmus.  No ptosis.   V:  Normal facial sensation.  VII:  Normal facial symmetry and movements.   VIII:  Normal hearing and vestibular function.   IX-X:  Normal palatal movement.   XI:  Normal shoulder shrug and head rotation.   XII:  Normal tongue strength and range of motion, no deviation or fasciculation.  MOTOR:  No atrophy, fasciculations or abnormal movements.  No pronator drift.  Tone is normal.    Right Upper Extremity:    Left Upper Extremity:    Deltoid  5/5   Deltoid  5/5   Biceps  5/5   Biceps  5/5   Triceps  5/5   Triceps  5/5   Wrist extensors  5/5   Wrist extensors  5/5   Wrist flexors  5/5   Wrist flexors  5/5   Finger extensors  5/5   Finger extensors  5/5   Finger flexors  5/5   Finger flexors  5/5   Dorsal interossei  5/5   Dorsal interossei  5/5   Abductor pollicis  5/5   Abductor pollicis  5/5   Tone (Ashworth scale)  0  Tone (Ashworth scale)  0   Right Lower Extremity:    Left Lower Extremity:    Hip flexors  5/5   Hip flexors  5/5   Hip extensors  5/5   Hip extensors  5/5   Knee flexors  5/5   Knee flexors  5/5   Knee extensors  5/5   Knee extensors  5/5   Dorsiflexors  5/5   Dorsiflexors  5/5   Plantarflexors  5/5  Plantarflexors  5/5   Toe extensors  5/5   Toe extensors  5/5   Toe flexors  5/5   Toe flexors  5/5   Tone (Ashworth scale)  0  Tone (Ashworth scale)  0   MSRs:  Right                                                                 Left brachioradialis 2+  brachioradialis 2+  biceps 2+  biceps 2+  triceps 2+  triceps 2+  patella 0  Patellar 0  ankle jerk 0  ankle jerk 0  Hoffman no  Hoffman no  plantar response down  plantar response down   SENSORY: Absent vibration  and pin prick distal to knees bilaterally. Proprioception is impaired. Sensation in the upper extremities is intact. Romberg sign is present.  COORDINATION/GAIT: Normal finger-to- nose-finger and heel-to-shin.  Intact rapid alternating movements bilaterally.  Able to rise from a chair without using arms. Gait is moderately wide-based, but stable. He is unable to perform stressed with tandem gait.   IMPRESSION: Ms. Hendricks Limes is 80 year-old female presenting for evaluation of bilateral hand paresthesias.  Her neurological examination is consistent with a large fiber peripheral neuropathy, conforming to a length dependent pattern. Her electrodiagnostic testing also suggest that she has neuropathy involving her upper extremities and likely underlying C7-8 radiculopathy. Electrodiagnostic testing of the lower extremity has not been performed.   There is a component of cervicalgia and radicular pain involving the upper extremity, so I would like to obtain imaging of her cervical spine to look for canal/foraminal stenosis. Neuropathy labs will also be ordered. Going forward, electrodiagnostic testing of the lower extremity may be needed to determine whether her upper extremity symptoms are indeed length-dependent, and if so, the extent of demyelinating changes.  Regarding her nocturnal leg twitching, I'm not entirely sure whether this represents focal myoclonus. I will offer her a trial of gabapentin and see if this not only helps her paresthesias, but also her movement. Based on her history, it does not sound like restless leg syndrome.   PLAN/RECOMMENDATIONS:  1.  Check CMP, Mg, TSH, vitamin B12, copper 1.  Start gabapentin 100mg  at bedtime for one week, if tolerating, increase to 1 tablet twice daily 2.  MRI cervical spine 3.  Check blood work 5   Fall precautions discussed and literature provided 6.  Return to clinic 4-6 weeks   The duration of this appointment visit was 50 minutes of face-to-face time  with the patient.  Greater than 50% of this time was spent in counseling, explanation of diagnosis, planning of further management, and coordination of care.   Thank you for allowing me to participate in patient's care.  If I can answer any additional questions, I would be pleased to do so.    Sincerely,    Donika K. Posey Pronto, DO

## 2013-10-11 LAB — COMPREHENSIVE METABOLIC PANEL
ALBUMIN: 4.2 g/dL (ref 3.5–5.2)
ALT: 17 U/L (ref 0–35)
AST: 36 U/L (ref 0–37)
Alkaline Phosphatase: 42 U/L (ref 39–117)
BUN: 17 mg/dL (ref 6–23)
CO2: 28 meq/L (ref 19–32)
Calcium: 9.5 mg/dL (ref 8.4–10.5)
Chloride: 96 mEq/L (ref 96–112)
Creat: 1.22 mg/dL — ABNORMAL HIGH (ref 0.50–1.10)
GLUCOSE: 98 mg/dL (ref 70–99)
POTASSIUM: 4.7 meq/L (ref 3.5–5.3)
Sodium: 136 mEq/L (ref 135–145)
Total Bilirubin: 0.5 mg/dL (ref 0.2–1.2)
Total Protein: 6.9 g/dL (ref 6.0–8.3)

## 2013-10-11 LAB — TSH: TSH: 4.769 u[IU]/mL — ABNORMAL HIGH (ref 0.350–4.500)

## 2013-10-11 LAB — CREATININE, SERUM: Creat: 1.22 mg/dL — ABNORMAL HIGH (ref 0.50–1.10)

## 2013-10-11 LAB — MAGNESIUM: MAGNESIUM: 1.9 mg/dL (ref 1.5–2.5)

## 2013-10-11 LAB — VITAMIN B12: VITAMIN B 12: 682 pg/mL (ref 211–911)

## 2013-10-12 LAB — COPPER, SERUM: Copper: 99 ug/dL (ref 70–175)

## 2013-10-13 NOTE — Progress Notes (Signed)
Note and labs faxed

## 2013-10-21 ENCOUNTER — Ambulatory Visit (HOSPITAL_COMMUNITY)
Admission: RE | Admit: 2013-10-21 | Discharge: 2013-10-21 | Disposition: A | Discharge status: Home / Self Care | Payer: Medicare Other | Source: Ambulatory Visit | Attending: Neurology | Admitting: Neurology

## 2013-10-21 DIAGNOSIS — M4802 Spinal stenosis, cervical region: Secondary | ICD-10-CM | POA: Insufficient documentation

## 2013-10-21 DIAGNOSIS — M2548 Effusion, other site: Secondary | ICD-10-CM | POA: Diagnosis not present

## 2013-10-21 DIAGNOSIS — R209 Unspecified disturbances of skin sensation: Secondary | ICD-10-CM | POA: Insufficient documentation

## 2013-10-21 DIAGNOSIS — G609 Hereditary and idiopathic neuropathy, unspecified: Secondary | ICD-10-CM

## 2013-10-21 DIAGNOSIS — M4310 Spondylolisthesis, site unspecified: Secondary | ICD-10-CM | POA: Diagnosis not present

## 2013-10-21 DIAGNOSIS — M5412 Radiculopathy, cervical region: Secondary | ICD-10-CM

## 2013-10-21 DIAGNOSIS — M542 Cervicalgia: Secondary | ICD-10-CM | POA: Insufficient documentation

## 2013-10-22 ENCOUNTER — Telehealth: Payer: Self-pay | Admitting: Neurology

## 2013-10-22 NOTE — Telephone Encounter (Signed)
I attempted to contact patient via phone today regarding the results of MRI cervical spine, however there was no answer so a message was left for the patient to return my call.   Muhammed Teutsch K. Alonso Gapinski, DO    

## 2013-10-23 ENCOUNTER — Other Ambulatory Visit: Payer: Self-pay | Admitting: *Deleted

## 2013-10-23 DIAGNOSIS — G8929 Other chronic pain: Secondary | ICD-10-CM

## 2013-10-23 DIAGNOSIS — M542 Cervicalgia: Principal | ICD-10-CM

## 2013-10-23 NOTE — Telephone Encounter (Signed)
Note faxed and PT ordered.

## 2013-10-23 NOTE — Telephone Encounter (Signed)
Called and discussed results results of MRI cervical spine which shows:  MRI cervical spine 10/21/2013: 1. No acute findings in the cervical spine.  2. Advanced degeneration of C5-C6 and C6-C7 in the setting of C3 through C5 posterior element ankylosis (congenital or degenerative).  Chronic spinal stenosis with spinal cord mass effect and abnormal cord signal (myelomalacia) at the C4-C5 through C5-C6 levels.  Associated severe biforaminal stenosis at these levels. The appearance has not significantly changed since 2014.  3. Advanced degenerative changes at the anterior C1-C2 articulation and at C2-C3 are stable, with foraminal stenosis but no spinal stenosis.  4. Chronic mild anterolisthesis at C7-T1 with stable degenerative changes resulting in severe C8 biforaminal stenosis.   Recommended increasing gabapentin 200mg  qhs.   Will also send referral for physical therapy for chronic neck pain.   Donika K. Posey Pronto, DO

## 2013-11-21 ENCOUNTER — Ambulatory Visit (INDEPENDENT_AMBULATORY_CARE_PROVIDER_SITE_OTHER): Payer: Medicare Other | Admitting: Neurology

## 2013-11-21 ENCOUNTER — Encounter: Payer: Self-pay | Admitting: Neurology

## 2013-11-21 VITALS — BP 130/74 | HR 75 | Ht 65.0 in | Wt 144.2 lb

## 2013-11-21 DIAGNOSIS — M5412 Radiculopathy, cervical region: Secondary | ICD-10-CM

## 2013-11-21 DIAGNOSIS — R209 Unspecified disturbances of skin sensation: Secondary | ICD-10-CM

## 2013-11-21 DIAGNOSIS — G5600 Carpal tunnel syndrome, unspecified upper limb: Secondary | ICD-10-CM

## 2013-11-21 DIAGNOSIS — G5603 Carpal tunnel syndrome, bilateral upper limbs: Secondary | ICD-10-CM

## 2013-11-21 MED ORDER — BLOOD GLUCOSE MONITOR KIT: PACK | Status: DC

## 2013-11-21 NOTE — Progress Notes (Signed)
Note faxed.

## 2013-11-21 NOTE — Progress Notes (Signed)
Follow-up Visit   Date: 11/21/2013    Misty Patterson MRN: [id number] DOB: [birth year]   Interim History: Misty Patterson is a 80 y.o. right-handed Caucasian female with history of peripheral vascular disease s/p R popliteal stent placement (05/02/2013), depression, GERD, hypertension, ovarian cancer, CKD returning to the clinic for follow-up of generalized malaise and paresthesias of her hands.   History of present illness: In 2012, she suffered a fall while shopping in [place] where her leg got caught by plastic covering off the racks. Since then she has neck pain, bilateral arm achy pain, numbness/tingling of the hands. She has seen a number of physicians since then including orthopedic surgery. She completed physical therapy which helped her arm numbness transiently. She has also underwent electrodiagnostic testing in November 2014 by Dr. Jannifer Franklin at [facility] which showed bilateral carpal tunnel syndrome, possible overlying peripheral neuropathy based on prolongation of sensory latency, and C7-8 radiculopathy on the left side. She denies any alleviating or exacerbating factors for her paresthesias are arm pain. She also complains of right leg foot twitching only occurring at nighttime. Movements are pain does and does not have any associated crawling sensation. She denies the urge to move her legs.   She is a very active individual up until recently when her episodic weakness has limited what she is able to do. Previously, she was cutting her own wood and taking care of her garden.   UPDATE 11/21/2013:  There has been no interval change in her symptoms and she continues to tired easily with continued arm pain and paresthesias. She is taking gabapentin 200 mg at bedtime without any significant relief. MRI of the cervical spine showed chronic spinal stenosis with mass effect and myelomalacia at C4-C5-C5-C6. There is severe by foraminal stenosis at these levels. She also has advanced degenerative changes  and severe C8 by foraminal stenosis. Overall, imaging appears stable from 2014.    Medications:  Current Outpatient Prescriptions on File Prior to Visit  Medication Sig Dispense Refill  . amLODipine (NORVASC) 5 MG tablet Take 5 mg by mouth daily.      Marland Kitchen atenolol (TENORMIN) 25 MG tablet Take 12.5 mg by mouth daily.      . B Complex-Biotin-FA (B-COMPLEX PO) Take 1 tablet by mouth daily.      . Cholecalciferol (VITAMIN D) 2000 UNITS tablet Take 2,000 Units by mouth daily.      . clopidogrel (PLAVIX) 75 MG tablet Take 1 tablet (75 mg total) by mouth daily.  30 tablet  6  . Cyanocobalamin (B-12 PO) Take 1 tablet by mouth daily.      . diazepam (VALIUM) 5 MG tablet Take 5 mg by mouth daily as needed for anxiety or sedation.      . diphenhydrAMINE (BENADRYL) 25 MG tablet Take 25 mg by mouth at bedtime as needed for sleep.      Marland Kitchen estradiol (ESTRACE) 0.5 MG tablet Take 0.5 mg by mouth daily.      . famotidine (PEPCID) 20 MG tablet Take 20 mg by mouth daily.      Marland Kitchen gabapentin (NEURONTIN) 100 MG capsule Take 1 tablet at bedtime for 1 week, then take one tablet twice daily.  60 capsule  3  . hydrochlorothiazide (HYDRODIURIL) 25 MG tablet Take 12.5 mg by mouth daily.       . Omega-3 Fatty Acids (FISH OIL PO) Take 2 capsules by mouth daily.      Marland Kitchen PROAIR HFA 108 (90 BASE) MCG/ACT inhaler as  needed.      . sertraline (ZOLOFT) 25 MG tablet Take 1 tablet by mouth daily.      . traZODone (DESYREL) 50 MG tablet Take 50 mg by mouth at bedtime.      Marland Kitchen VITAMIN E PO Take 2 capsules by mouth daily.       No current facility-administered medications on file prior to visit.    Allergies:  Allergies  Allergen Reactions  . Aspirin Other (See Comments)    REACTION:  Choking sensation  . Formaldehyde Swelling    Tongue swelling  . Sodium Pentobarbital [Pentobarbital] Other (See Comments)    Memory Loss     Review of Systems:  CONSTITUTIONAL: No fevers, chills, night sweats, or weight loss.   EYES: No  visual changes or eye pain ENT: No hearing changes.  No history of nose bleeds.   RESPIRATORY: No cough, wheezing and shortness of breath.   CARDIOVASCULAR: Negative for chest pain, and palpitations.   GI: Negative for abdominal discomfort, blood in stools or black stools.  No recent change in bowel habits.   GU:  No history of incontinence.   MUSCLOSKELETAL: + history of joint pain or swelling.  No myalgias.   SKIN: Negative for lesions, rash, and itching.   ENDOCRINE: Negative for cold or heat intolerance, polydipsia or goiter.   PSYCH:  No depression or anxiety symptoms.   NEURO: As Above.   Vital Signs:  BP 130/74  Pulse 75  Ht 5\' 5"  (1.651 m)  Wt 144 lb 4 oz (65.431 kg)  BMI 24.00 kg/m2  SpO2 97%  Neurological Exam: MENTAL STATUS including orientation to time, place, person, recent and remote memory, attention span and concentration, language, and fund of knowledge is normal.  Speech is not dysarthric.  CRANIAL NERVES: No visual field defects. Pupils equal round and reactive to light.  Normal conjugate, extra-ocular eye movements in all directions of gaze.  No ptosis.  Face is symmetric. Palate elevates symmetrically.  Tongue is midline.  MOTOR:  Motor strength is 5/5 in all extremities.  No atrophy, fasciculations or abnormal movements.  No pronator drift.  Tone is normal.    MSRs:  Reflexes are 2+/4 in the upper extremities and absent in the lower extremities.   SENSORY: Absent vibration and pin prick distal to knees bilaterally. Proprioception is impaired. Sensation in the upper extremities is intact. Romberg sign is present.  COORDINATION/GAIT: Gait is moderately wide-based, but stable.    Data: CT head wo contrast 12/12/2011: Moderate small vessel ischemic change and atrophy. No acute intracranial abnormality.   EMG 01/28/2013:  Nerve conduction studies done on both upper extremities shows evidence of a mild right carpal tunnel syndrome, and a borderline left carpal  tunnel syndrome. There is diffuse prolongation of the sensory latencies in the upper extremities, and the possibility of an overlying peripheral neuropathy should be considered. The patient does report some numbness and burning sensations in the feet. EMG evaluation of the right upper extremity shows some mild distal chronic denervation consistent with a peripheral neuropathy, but no evidence of an overlying cervical radiculopathy. EMG evaluation of the left upper extremity shows findings consistent with a chronic stable C8 and C7 radiculopathy.   MRI cervical spine 10/21/2013: 1. No acute findings in the cervical spine.  2. Advanced degeneration of C5-C6 and C6-C7 in the setting of C3 through C5 posterior element ankylosis (congenital or degenerative). Chronic spinal stenosis with spinal cord mass effect and abnormal cord signal (myelomalacia) at the C4-C5  through C5-C6 levels. Associated severe biforaminal stenosis at these levels. The appearance has not significantly changed since 2014.  3. Advanced degenerative changes at the anterior C1-C2 articulation and at C2-C3 are stable, with foraminal stenosis but no spinal stenosis.  4. Chronic mild anterolisthesis at C7-T1 with stable degenerative changes resulting in severe C8 biforaminal stenosis.  Labs 10/10/2013:  Mg 1.9, vitamin B12 682, copper 99, Cr 1.22*, TSH 4.7*   IMPRESSION/PLAN: Ms. Stauber is 80 year-old female returning for evaluation of bilateral hand paresthesias and pain. Her neurological examination is consistent with a large fiber peripheral neuropathy, conforming to a length dependent pattern. Imaging of her cervical spine shows advanced degenerative changes and severe spinal canal stenosis with myelomalacia at C4-5 through C5-6 levels with associated biforaminal stenosis.  These abnormalities appear to be stable from 2014. I explained that although injury to her neck may have contributed to spinal canal stenosis, the degree of arthritis  has probably been long-standing. Based on the severity of symptoms, I offered her a referral to neurosurgery for evaluation, however she is not interested in any surgery at this time.  I further explained that her exam shows a length dependent pattern of neuropathy and it is possible that she may have both a peripheral neuropathy and cervical radiculopathy contributing to her hand symptoms. Electrodiagnostic testing would help determine the degree of sensory neuropathy or cervical radiculopathy causing upper extremity paresthesias.  In the meantime, I will try to optimize her gabapentin to provide symptomatic relief. She will start taking gabapentin 300 mg at bedtime.  From a home safety standpoint, because she is alone during the daytime hours, I have provided her with information for Life Alert System and encouraged her to look into this.  Return to clinic in 101-months.   The duration of this appointment visit was 30 minutes of face-to-face time with the patient.  Greater than 50% of this time was spent in counseling, explanation of diagnosis, planning of further management, and coordination of care.   Thank you for allowing me to participate in patient's care.  If I can answer any additional questions, I would be pleased to do so.    Sincerely,    Hutch Rhett K. Posey Pronto, DO

## 2013-11-21 NOTE — Patient Instructions (Addendum)
1.  EMG of the upper extremities 2.  Increase gabapentin to 300mg  at bedtime (3 pills) at bedtime.  If you notice improvement, call my office so I can give you a new prescriptions for 300mg  tablets 3.  Start using wrist splint at bedtime  4.  Please look into [employer] Alert Systems 5.  Return to clinic in 62-months  [place] that can locate patients outside the home:  http://mobilealertsystems.com/ 864-756-6074 http://www.lifelinesys.com/content/lifeline-products/get-life-gosafe  662-333-0921 www.verizonwireless.com/sure/ 8128828866  Medial Alert systems that link to smart phones:  http://www.lifelinesys.com/content/lifeline-products/response-app https://www.stanton.info/ RecycleRoad.pl.aspx  [employer] GPS Tracker:  VoiceZap.is [phone]

## 2013-11-26 ENCOUNTER — Encounter: Payer: Self-pay | Admitting: Family

## 2013-11-27 ENCOUNTER — Ambulatory Visit (INDEPENDENT_AMBULATORY_CARE_PROVIDER_SITE_OTHER): Payer: Medicare Other | Admitting: Family

## 2013-11-27 ENCOUNTER — Ambulatory Visit (INDEPENDENT_AMBULATORY_CARE_PROVIDER_SITE_OTHER)
Admission: RE | Admit: 2013-11-27 | Discharge: 2013-11-27 | Disposition: A | Discharge status: Home / Self Care | Payer: Medicare Other | Source: Ambulatory Visit | Attending: Family | Admitting: Family

## 2013-11-27 ENCOUNTER — Ambulatory Visit (HOSPITAL_COMMUNITY)
Admission: RE | Admit: 2013-11-27 | Discharge: 2013-11-27 | Disposition: A | Discharge status: Home / Self Care | Payer: Medicare Other | Source: Ambulatory Visit | Attending: Family | Admitting: Family

## 2013-11-27 ENCOUNTER — Other Ambulatory Visit: Payer: Self-pay | Admitting: Family

## 2013-11-27 ENCOUNTER — Encounter: Payer: Self-pay | Admitting: Family

## 2013-11-27 VITALS — BP 154/69 | HR 63 | Resp 14 | Ht 65.5 in | Wt 147.0 lb

## 2013-11-27 DIAGNOSIS — I739 Peripheral vascular disease, unspecified: Secondary | ICD-10-CM

## 2013-11-27 DIAGNOSIS — Z48812 Encounter for surgical aftercare following surgery on the circulatory system: Secondary | ICD-10-CM

## 2013-11-27 DIAGNOSIS — Z9861 Coronary angioplasty status: Secondary | ICD-10-CM | POA: Insufficient documentation

## 2013-11-27 DIAGNOSIS — M79609 Pain in unspecified limb: Secondary | ICD-10-CM | POA: Diagnosis not present

## 2013-11-27 DIAGNOSIS — I10 Essential (primary) hypertension: Secondary | ICD-10-CM | POA: Diagnosis not present

## 2013-11-27 DIAGNOSIS — M79661 Pain in right lower leg: Secondary | ICD-10-CM

## 2013-11-27 NOTE — Progress Notes (Signed)
[facility] [place] HISTORY AND PHYSICAL -PAD  History of Present Illness Misty Patterson is a 80 y.o. female patient of Dr. Oneida Alar who had a right popliteal stent placed on 05/02/2013.  She returns today for surveillance.  Her right calf feels weak after walking about 1/8 mile, also her back and arms "gives out".  She denies non healing wounds.  She denies history of stroke, TIA, or CAD.  Injured her right toes and hands in October, 2013 with a fall.  She sees Dr. Posey Pronto, neurologist, for c-spine issues. She tires easily since this fall, but she is always walking  The patient denies New Medical or Surgical History.   Pt Diabetic: No   Pt smoker: non-smoker, lived with a smoker, second hand exposure   Pt meds include:  Statin :No, states her cholesterol is being checked  ASA: No  Other anticoagulants/antiplatelets: Plavix     Past Medical History  Diagnosis Date  . Cancer     ovarian  . Peripheral vascular disease   . Restless leg syndrome   . Fall Oct. 13, 2013    [place] [address] in store- from [employer]  . Hypertension   . GERD (gastroesophageal reflux disease)   . DVT (deep venous thrombosis)     right leg  . Varicose vein   . Anxiety   . Peripheral neuropathy   . Dyslipidemia   . Ovarian cancer   . Colon polyp   . Osteoporosis   . Incontinence of urine   . COPD (chronic obstructive pulmonary disease)   . Esophageal dysmotility   . Zenker's diverticulum     small  . Decreased GFR   . Chronic kidney disease   . Unspecified disorder of skin and subcutaneous tissue   . DJD (degenerative joint disease) of cervical spine   . Radiculopathy   . CTS (carpal tunnel syndrome)     Social History History  Substance Use Topics  . Smoking status: Never Smoker   . Smokeless tobacco: Never Used  . Alcohol Use: No    Family History Family History  Problem Relation Age of Onset  . Hypertension Mother   . Lung cancer Son     Deceased, 59  . Healthy Son   .  Other Father     MVA  . Hypertension Brother   . Kidney disease Sister     Past Surgical History  Procedure Laterality Date  . Vein surgery  1950  . Abdominal aortagram  Feb. 20, 2015    Allergies  Allergen Reactions  . Aspirin Other (See Comments)    REACTION:  Choking sensation  . Formaldehyde Swelling    Tongue swelling  . Sodium Pentobarbital [Pentobarbital] Other (See Comments)    Memory Loss    Current Outpatient Prescriptions  Medication Sig Dispense Refill  . amLODipine (NORVASC) 5 MG tablet Take 5 mg by mouth daily.      Marland Kitchen atenolol (TENORMIN) 25 MG tablet Take 12.5 mg by mouth daily.      . B Complex-Biotin-FA (B-COMPLEX PO) Take 1 tablet by mouth daily.      . Blood Glucose Monitoring Suppl (BLOOD GLUCOSE METER KIT AND SUPPLIES) KIT Dispense based on patient and insurance preference. Use up to four times daily as directed. (FOR ICD-9 250.00, 250.01).  1 each  3  . Cholecalciferol (VITAMIN D) 2000 UNITS tablet Take 2,000 Units by mouth daily.      . clopidogrel (PLAVIX) 75 MG tablet Take 1 tablet (75  mg total) by mouth daily.  30 tablet  6  . Cyanocobalamin (B-12 PO) Take 1 tablet by mouth daily.      . diazepam (VALIUM) 5 MG tablet Take 5 mg by mouth daily as needed for anxiety or sedation.      . diphenhydrAMINE (BENADRYL) 25 MG tablet Take 25 mg by mouth at bedtime as needed for sleep.      Marland Kitchen ESTRACE VAGINAL 0.1 MG/GM vaginal cream       . estradiol (ESTRACE) 0.5 MG tablet Take 0.5 mg by mouth daily.      . famotidine (PEPCID) 20 MG tablet Take 20 mg by mouth daily.      Marland Kitchen gabapentin (NEURONTIN) 100 MG capsule Take 1 tablet at bedtime for 1 week, then take one tablet twice daily.  60 capsule  3  . hydrochlorothiazide (HYDRODIURIL) 25 MG tablet Take 12.5 mg by mouth daily.       . meloxicam (MOBIC) 15 MG tablet       . Omega-3 Fatty Acids (FISH OIL PO) Take 2 capsules by mouth daily.      Marland Kitchen PROAIR HFA 108 (90 BASE) MCG/ACT inhaler as needed.      . sertraline  (ZOLOFT) 25 MG tablet Take 1 tablet by mouth daily.      . traZODone (DESYREL) 50 MG tablet Take 50 mg by mouth at bedtime.      Marland Kitchen VITAMIN E PO Take 2 capsules by mouth daily.       No current facility-administered medications for this visit.    ROS: See HPI for pertinent positives and negatives.   Physical Examination  Filed Vitals:   11/27/13 1038  BP: 154/69  Pulse: 63  Resp: 14  Height: 5' 5.5" (1.664 m)  Weight: 147 lb (66.679 kg)  SpO2: 100%   Body mass index is 24.08 kg/(m^2).  General: A&O x 3, WDWN.  Gait: normal  Eyes: PERRLA.  Pulmonary: CTAB, without wheezes , rales or rhonchi.  Cardiac: regular Rythm with occasional premature beats, without detected murmur.   Carotid Bruits  Left  Right    Negative  Negative   Aorta is not palpable.  Radial pulses: are 2+ palpable and =   VASCULAR EXAM:  Extremities without ischemic changes  without Gangrene; without open wounds. 1-2+ pitting edema both lower legs.  LE Pulses  LEFT  RIGHT   FEMORAL  3+ palpable  3+ palpable   POPLITEAL  not palpable  not palpable   POSTERIOR TIBIAL  2+ palpable  2+palpable   DORSALIS PEDIS  ANTERIOR TIBIAL  Not palpable  1+ palpable   Abdomen: soft, NT, no masses.  Skin: no rashes, no ulcers noted.  Musculoskeletal: no muscle wasting or atrophy.  Neurologic: A&O X 3; Appropriate Affect ; SENSATION: normal; MOTOR FUNCTION: moving all extremities equally, motor strength 4/5 throughout. Speech is fluent/normal. CN 2-12 intact.   Non-Invasive Vascular Imaging: DATE: 11/27/2013 LOWER EXTREMITY ARTERIAL EVALUATION    INDICATION: Right lower extremity Peripheral Vascular Disease ,pain    PREVIOUS INTERVENTION(S): Right superficial femoral to popliteal artery stent placed 05/02/2013.    DUPLEX EXAM:     RIGHT  LEFT   Peak Systolic Velocity (cm/s) Ratio (if abnormal) Waveform  Peak Systolic Velocity (cm/s) Ratio (if abnormal) Waveform  147  B Artery - Proximal to Stent     131  B Stent  - Origin     84  B Stent - Proximal     71  B Stent -  Mid     89  B Stent - Distal     81  B  Stent - End     85  B Artery - Distal to Stent     0.89/0.61 Today's ABI / TBI 0.86/0.55  0.99/0.66 Previous ABI / TBI (09/04/2013  ) 0.92/0.67    Waveform:    M - Monophasic       B - Biphasic       T - Triphasic  If Ankle Brachial Index (ABI) or Toe Brachial Index (TBI) performed, please see complete report  ADDITIONAL FINDINGS:     IMPRESSION: Right superficial femoral to popliteal artery stent is patent, no hemodynamically significant stenosis present. Dampened Biphasic Doppler waveforms present throughout suggestive of a more proximal disease process than could be visualized.    Compared to the previous exam:  Decrease in the bilateral ankle brachial indices since previous study on 09/04/2013.    ASSESSMENT: TERREN HABERLE is a 80 y.o. female who is s/p right popliteal stent placed on 05/02/2013.  Today's right LE arterial Duplex demonstrates right superficial femoral to popliteal artery stent is patent, no hemodynamically significant stenosis present. Dampened Biphasic Doppler waveforms present throughout suggestive of a more proximal disease process than could be visualized. ABI's decreased to mild bilateral arterial occlusive disease, was normal three months ago. She seems to be walking as much, does not have non healing wounds. She sees Dr. Posey Pronto, neurologist,  for c-spine issues.    PLAN:  Continue to walk, graduated walking program as tolerated by her lumbar spine issues. I discussed in depth with the patient the nature of atherosclerosis, and emphasized the importance of maximal medical management including strict control of blood pressure, blood glucose, and lipid levels, obtaining regular exercise, and continued cessation of smoking.  The patient is aware that without maximal medical management the underlying atherosclerotic disease process will progress, limiting the benefit of  any interventions.  Based on the patient's vascular studies and examination, pt will return to clinic in 6 months for ABI's and right LE arterial Duplex.   The patient was given information about PAD including signs, symptoms, treatment, what symptoms should prompt the patient to seek immediate medical care, and risk reduction measures to take.  Clemon Chambers, RN, MSN, FNP-C [facility] Phone: [phone]  Clinic MD: Early  11/27/2013 10:41 AM

## 2013-11-27 NOTE — Patient Instructions (Signed)

## 2013-11-28 ENCOUNTER — Telehealth: Payer: Self-pay | Admitting: Neurology

## 2013-11-28 NOTE — Telephone Encounter (Signed)
We moved her appt to 12-01-13 from 01-01-14 for EMG

## 2013-11-28 NOTE — Addendum Note (Signed)
Addended by: Mena Goes on: 11/28/2013 09:30 AM   Modules accepted: Orders

## 2013-12-01 ENCOUNTER — Ambulatory Visit (INDEPENDENT_AMBULATORY_CARE_PROVIDER_SITE_OTHER): Payer: Medicare Other | Admitting: Neurology

## 2013-12-01 DIAGNOSIS — G5603 Carpal tunnel syndrome, bilateral upper limbs: Secondary | ICD-10-CM

## 2013-12-01 DIAGNOSIS — R209 Unspecified disturbances of skin sensation: Secondary | ICD-10-CM

## 2013-12-01 DIAGNOSIS — M5412 Radiculopathy, cervical region: Secondary | ICD-10-CM

## 2013-12-01 MED ORDER — GABAPENTIN 100 MG PO CAPS: 200.0000 mg | ORAL_CAPSULE | Freq: Every day | ORAL | Status: DC

## 2013-12-01 NOTE — Procedures (Signed)
[facility]  [address]  [place], [place] [address] Tel: [phone] Fax:  [phone] Test Date:  12/01/2013  Patient: Misty Patterson DOB: May 27, 1926 Physician: Narda Amber  Sex: Female Height: 5' 5.5" Ref Phys: Narda Amber  ID#: [id number]   Technician: Laureen Ochs R. NCS T.   Patient Complaints: Patient is an 80 year old female here for evaluation of bilateral hand paresthesias and neck pain.  NCV & EMG Findings: Extensive electrodiagnostic testing of the right upper extremity and additional studies of the left reveals:  1. Right median sensory response is reduced in amplitude and right palmar studies are abnormal. The left median, left palmar studies, bilateral ulnar, and bilateral sensory responses are within normal limits. 2. Bilateral median motor responses are within normal limits.  Bilateral ulnar motor responses show slowing across the elbow with preserved latency and amplitude, worse on the left. 3. Needle electrode examination shows chronic motor axon loss changes affecting bilateral C7 myotomes and the left C6 myotome. There is no evidence of active ongoing denervation.  Impression: 1. Right median neuropathy, at or distal to the wrist consistent with the clinical diagnosis of carpal tunnel syndrome. Overall, these findings are mild to moderate in degree electrically. 2. 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 3. Chronic C7 radiculopathy affecting bilateral upper extremities, moderate in degree electrically.  4. Chronic C6 radiculopathy affecting the left upper extremity, mild-to-moderate in degree electrically.     ___________________________ Narda Amber    Nerve Conduction Studies Anti Sensory Summary Table   Stim Site NR Peak (ms) Norm Peak (ms) P-T Amp (V) Norm P-T Amp  Left Median Anti Sensory (2nd Digit)  33C  Wrist    3.3 <3.8 10.3 >10  Right Median Anti Sensory  (2nd Digit)  33C  Wrist    3.7 <3.8 7.9 >10  Left Radial Anti Sensory (Base 1st Digit)  33C  Wrist    2.3 <2.8 13.0 >10  Right Radial Anti Sensory (Base 1st Digit)  33C  Wrist    2.5 <2.8 13.9 >10  Left Ulnar Anti Sensory (5th Digit)  33C  Wrist    2.9 <3.2 9.7 >5  Right Ulnar Anti Sensory (5th Digit)  33C  Wrist    3.0 <3.2 12.8 >5   Motor Summary Table   Stim Site NR Onset (ms) Norm Onset (ms) O-P Amp (mV) Norm O-P Amp Site1 Site2 Delta-0 (ms) Dist (cm) Vel (m/s) Norm Vel (m/s)  Left Median Motor (Abd Poll Brev)  33C  Wrist    3.4 <4.0 7.6 >5 Elbow Wrist 5.3 28.0 53 >50  Elbow    8.7  7.5         Right Median Motor (Abd Poll Brev)  33C  Wrist    2.9 <4.0 5.1 >5 Elbow Wrist 6.1 30.5 50 >50  Elbow    9.0  4.3         Left Ulnar Motor (Abd Dig Minimi)  33C  Wrist    2.8 <3.1 10.1 >7 B Elbow Wrist 3.6 23.0 64 >50  B Elbow    6.4  8.2  A Elbow B Elbow 3.1 14.0 45 >50  A Elbow    9.5  7.2         Right Ulnar Motor (Abd Dig Minimi)  33C  Wrist    2.3 <3.1 10.4 >7 B Elbow Wrist 4.1 20.5 50 >50  B Elbow  6.4  8.9  A Elbow B Elbow 2.3 10.0 43 >50  A Elbow    8.7  8.3         Right Ulnar (FDI) Motor (1st DI)  33C  Wrist    4.3 <4.5 10.3 >7         Comparison Summary Table   Stim Site NR Peak (ms) Norm Peak (ms) P-T Amp (V) Site1 Site2 Delta-P (ms) Norm Delta (ms)  Left Median/Ulnar Palm Comparison (Wrist - 8cm)  33C  Median Palm    2.0 <2.2 35.6 Median Palm Ulnar Palm 0.2   Ulnar Palm    1.8 <2.2 0.7      Right Median/Ulnar Palm Comparison (Wrist - 8cm)  33C  Median Palm    2.3 <2.2 19.2 Median Palm Ulnar Palm 0.6   Ulnar Palm    1.7 <2.2 11.4       F Wave Studies   NR F-Lat (ms) Lat Norm (ms) L-R F-Lat (ms)  Left Ulnar (Mrkrs) (Abd Dig Min)  33C     29.83 <33 0.42  Right Ulnar (Mrkrs) (Abd Dig Min)  33C     30.25 <33 0.42   EMG   Side Muscle Ins Act Fibs Psw Fasc Number Recrt Dur Dur. Amp Amp. Poly Poly. Comment  Right 1stDorInt Nml Nml Nml Nml Nml Nml Nml  Nml Nml Nml Nml Nml N/A  Right Abd Poll Brev Nml Nml Nml Nml 1- Mod-R Few 1+ Nml Nml Nml Nml N/A  Right FlexPolLong Nml Nml Nml Nml Nml Nml Nml Nml Nml Nml Nml Nml N/A  Right PronatorTeres Nml Nml Nml Nml 1- Rapid Some 1+ Some 1+ Nml Nml N/A  Right Biceps Nml Nml Nml Nml Nml Nml Nml Nml Nml Nml Nml Nml N/A  Right Triceps Nml Nml Nml Nml 1- Mod-R Some 1+ Nml Nml Nml Nml N/A  Right Deltoid Nml Nml Nml Nml Nml Nml Nml Nml Nml Nml Nml Nml N/A  Left 1stDorInt Nml Nml Nml Nml Nml Nml Nml Nml Nml Nml Nml Nml N/A  Left Ext Indicis Nml Nml Nml Nml Nml Nml Nml Nml Nml Nml Nml Nml N/A  Left PronatorTeres Nml Nml Nml Nml 1- Mod-R Some 1+ Nml Nml Nml Nml N/A  Left Biceps Nml Nml Nml Nml 1- Mod-R Some 1+ Nml Nml Nml Nml N/A  Left Triceps Nml Nml Nml Nml 1- Mod-R Some 1+ Nml Nml Nml Nml N/A  Left Deltoid Nml Nml Nml Nml Nml Nml Nml Nml Nml Nml Nml Nml N/A      Waveforms:

## 2013-12-02 ENCOUNTER — Telehealth: Payer: Self-pay | Admitting: Neurology

## 2013-12-03 ENCOUNTER — Ambulatory Visit: Payer: Medicare Other | Attending: Neurology | Admitting: Physical Therapy

## 2013-12-03 DIAGNOSIS — R269 Unspecified abnormalities of gait and mobility: Secondary | ICD-10-CM | POA: Insufficient documentation

## 2013-12-03 DIAGNOSIS — IMO0001 Reserved for inherently not codable concepts without codable children: Secondary | ICD-10-CM | POA: Insufficient documentation

## 2013-12-03 DIAGNOSIS — M6281 Muscle weakness (generalized): Secondary | ICD-10-CM | POA: Insufficient documentation

## 2013-12-08 ENCOUNTER — Other Ambulatory Visit: Payer: Self-pay | Admitting: Vascular Surgery

## 2013-12-10 ENCOUNTER — Ambulatory Visit: Payer: Medicare Other | Admitting: Physical Therapy

## 2013-12-10 DIAGNOSIS — IMO0001 Reserved for inherently not codable concepts without codable children: Secondary | ICD-10-CM | POA: Diagnosis not present

## 2013-12-17 ENCOUNTER — Ambulatory Visit: Payer: Medicare Other | Attending: Neurology | Admitting: Physical Therapy

## 2013-12-17 DIAGNOSIS — M542 Cervicalgia: Secondary | ICD-10-CM | POA: Diagnosis present

## 2013-12-17 DIAGNOSIS — M6281 Muscle weakness (generalized): Secondary | ICD-10-CM | POA: Insufficient documentation

## 2013-12-17 DIAGNOSIS — G8929 Other chronic pain: Secondary | ICD-10-CM | POA: Insufficient documentation

## 2013-12-17 DIAGNOSIS — R269 Unspecified abnormalities of gait and mobility: Secondary | ICD-10-CM | POA: Insufficient documentation

## 2013-12-24 ENCOUNTER — Ambulatory Visit: Payer: Medicare Other

## 2013-12-24 DIAGNOSIS — M542 Cervicalgia: Secondary | ICD-10-CM | POA: Diagnosis not present

## 2013-12-25 ENCOUNTER — Telehealth: Payer: Self-pay | Admitting: Neurology

## 2013-12-25 NOTE — Telephone Encounter (Signed)
Pt called requesting to speak to a nurse regarding the therapy she is doing.  Please call pt after 12PM.  Please call pt # [phone]

## 2013-12-25 NOTE — Telephone Encounter (Signed)
Patient had therapy the other day and the day after her back hurt so bad she could hardly walk.  She said that she doesn't think it is helping her very much.  She is going to go back for another time and see if she wants to continue.

## 2013-12-30 ENCOUNTER — Ambulatory Visit: Payer: Medicare Other | Admitting: Physical Therapy

## 2013-12-30 DIAGNOSIS — M542 Cervicalgia: Secondary | ICD-10-CM | POA: Diagnosis not present

## 2014-01-01 ENCOUNTER — Encounter: Payer: Medicare Other | Admitting: Neurology

## 2014-01-19 DIAGNOSIS — Z0279 Encounter for issue of other medical certificate: Secondary | ICD-10-CM

## 2014-01-21 ENCOUNTER — Telehealth: Payer: Self-pay | Admitting: *Deleted

## 2014-01-21 NOTE — Telephone Encounter (Signed)
Patient said that she quit PT.  She said that she was very sore the next day and fell.  She went to Urgent Care and they said that she did not break anything.  She said that she does have a follow up appointment with her doctor next week but wants to know if you need to see her.  She does not want to go back to PT.

## 2014-01-22 NOTE — Telephone Encounter (Signed)
Sorry to hear that.  OK to stop PT.  She should f/u with PCP.  She is always welcome to come back and see [place] for any ongoing neurological issues.  Zackarie Chason K. Posey Pronto, DO

## 2014-01-22 NOTE — Telephone Encounter (Signed)
Noted  

## 2014-02-04 ENCOUNTER — Telehealth: Payer: Self-pay

## 2014-02-04 NOTE — Telephone Encounter (Signed)
Phone call from pt.  Stated she fell about one week ago. C/o pain in the right lower leg from the knee to the ankle;  Reported this has worsened since her fall.  Stated "the pain is in the bone on the front of my leg, and just above the ankle."  Stated "it hurts when I move my ankle."  Reported some swelling around the ankle; describes "some indentation when I press on it."  Denies any redness or inflammation.  Denies open sore.  Denies any calf pain.   Reported she went to Urgent Care right after the fall, and had an xray of her ribs; informed no fractured ribs.  Was told to see her PCP for further evaluation.  Stated her PCP did not xray the leg; reported the pain is worse than it was last week.  Reported "I can hardly walk on it."   Advised she should either call Dr. Lysle Rubens, or go back to Urgent Care for further evaluation, and to rule out fracture.  Pt. Verb. Understanding.  Stated she will go back to Urgent Care.

## 2014-02-10 ENCOUNTER — Other Ambulatory Visit: Payer: Self-pay | Admitting: Internal Medicine

## 2014-02-10 DIAGNOSIS — M5416 Radiculopathy, lumbar region: Secondary | ICD-10-CM

## 2014-02-19 ENCOUNTER — Encounter (HOSPITAL_COMMUNITY): Payer: Self-pay | Admitting: Vascular Surgery

## 2014-02-19 ENCOUNTER — Ambulatory Visit
Admission: RE | Admit: 2014-02-19 | Discharge: 2014-02-19 | Disposition: A | Discharge status: Home / Self Care | Payer: Medicare Other | Source: Ambulatory Visit | Attending: Internal Medicine | Admitting: Internal Medicine

## 2014-02-19 DIAGNOSIS — M5416 Radiculopathy, lumbar region: Secondary | ICD-10-CM

## 2014-02-20 ENCOUNTER — Ambulatory Visit (INDEPENDENT_AMBULATORY_CARE_PROVIDER_SITE_OTHER): Payer: Medicare Other | Admitting: Neurology

## 2014-02-20 ENCOUNTER — Encounter: Payer: Self-pay | Admitting: Neurology

## 2014-02-20 VITALS — BP 110/64 | HR 70 | Wt 146.0 lb

## 2014-02-20 DIAGNOSIS — M542 Cervicalgia: Secondary | ICD-10-CM

## 2014-02-20 DIAGNOSIS — M5412 Radiculopathy, cervical region: Secondary | ICD-10-CM

## 2014-02-20 MED ORDER — ROLLATOR ULTRA-LIGHT MISC: 1.0000 | Freq: Every day | Status: DC

## 2014-02-20 MED ORDER — GABAPENTIN 300 MG PO CAPS: 300.0000 mg | ORAL_CAPSULE | Freq: Two times a day (BID) | ORAL | Status: DC

## 2014-02-20 NOTE — Progress Notes (Signed)
Order sent and patient instructed to wear splint.

## 2014-02-20 NOTE — Patient Instructions (Addendum)
1. Please get a 4-wheeled Rollator. 2. Increase gabapentin 300mg  twice daily 3. Return to clinic in 61-months

## 2014-02-20 NOTE — Progress Notes (Signed)
Follow-up Visit   Date: 02/20/2014    MELITZA METHENY MRN: [id number] DOB: 1926-07-26   Interim History: Misty Patterson is a 80 y.o. right-handed Caucasian female with history of peripheral vascular disease s/p R popliteal stent placement (05/02/2013), depression, GERD, hypertension, ovarian cancer, CKD returning to the clinic for follow-up of generalized malaise and paresthesias of her hands.   History of present illness: In 2012, she suffered a fall while shopping in [place] where her leg got caught by plastic covering off the racks. Since then she has neck pain, bilateral arm achy pain, numbness/tingling of the hands. She has seen a number of physicians since then including orthopedic surgery. She completed physical therapy which helped her arm numbness transiently. She has also underwent electrodiagnostic testing in November 2014 by Dr. Jannifer Franklin at [facility] which showed bilateral carpal tunnel syndrome, possible overlying peripheral neuropathy based on prolongation of sensory latency, and C7-8 radiculopathy on the left side. She denies any alleviating or exacerbating factors for her paresthesias are arm pain. She also complains of right leg foot twitching only occurring at nighttime. Movements are pain does and does not have any associated crawling sensation. She denies the urge to move her legs.   She is a very active individual up until recently when her episodic weakness has limited what she is able to do. Previously, she was cutting her own wood and taking care of her garden.   UPDATE 11/21/2013:  There has been no interval change in her symptoms and she continues to tired easily with continued arm pain and paresthesias. She is taking gabapentin 200 mg at bedtime without any significant relief. MRI of the cervical spine showed chronic spinal stenosis with mass effect and myelomalacia at C4-C5-C5-C6. There is severe by foraminal stenosis at these levels. She also has advanced degenerative changes  and severe C8 by foraminal stenosis. Overall, imaging appears stable from 2014.   UPDATE 02/20/2014:  She has on new symptoms and no change in her bilateral hand numbness and weakness.  She was at the grocery store and when she bought milk home, the gallon slipped out of her hand and hit the floor.  She did get a Life Alert System and is wearing it.  She continues to have gait difficulty from low back pain and her neuropathy.   Medications:  Current Outpatient Prescriptions on File Prior to Visit  Medication Sig Dispense Refill  . amLODipine (NORVASC) 5 MG tablet Take 5 mg by mouth daily.    Marland Kitchen atenolol (TENORMIN) 25 MG tablet Take 12.5 mg by mouth daily.    . B Complex-Biotin-FA (B-COMPLEX PO) Take 1 tablet by mouth daily.    . Blood Glucose Monitoring Suppl (BLOOD GLUCOSE METER KIT AND SUPPLIES) KIT Dispense based on patient and insurance preference. Use up to four times daily as directed. (FOR ICD-9 250.00, 250.01). 1 each 3  . Cholecalciferol (VITAMIN D) 2000 UNITS tablet Take 2,000 Units by mouth daily.    . clopidogrel (PLAVIX) 75 MG tablet TAKE 1 TABLET BY MOUTH EVERY DAY 30 tablet 6  . Cyanocobalamin (B-12 PO) Take 1 tablet by mouth daily.    . diazepam (VALIUM) 5 MG tablet Take 5 mg by mouth daily as needed for anxiety or sedation.    . diphenhydrAMINE (BENADRYL) 25 MG tablet Take 25 mg by mouth at bedtime as needed for sleep.    Marland Kitchen ESTRACE VAGINAL 0.1 MG/GM vaginal cream     . estradiol (ESTRACE) 0.5 MG tablet Take 0.5  mg by mouth daily.    . famotidine (PEPCID) 20 MG tablet Take 20 mg by mouth daily.    . hydrochlorothiazide (HYDRODIURIL) 25 MG tablet Take 12.5 mg by mouth daily.     . meloxicam (MOBIC) 15 MG tablet     . Omega-3 Fatty Acids (FISH OIL PO) Take 2 capsules by mouth daily.    Marland Kitchen PROAIR HFA 108 (90 BASE) MCG/ACT inhaler as needed.    . sertraline (ZOLOFT) 25 MG tablet Take 1 tablet by mouth daily.    . traZODone (DESYREL) 50 MG tablet Take 50 mg by mouth at bedtime.    Marland Kitchen  VITAMIN E PO Take 2 capsules by mouth daily.     No current facility-administered medications on file prior to visit.    Allergies:  Allergies  Allergen Reactions  . Aspirin Other (See Comments)    REACTION:  Choking sensation  . Formaldehyde Swelling    Tongue swelling  . Sodium Pentobarbital [Pentobarbital] Other (See Comments)    Memory Loss     Review of Systems:  CONSTITUTIONAL: No fevers, chills, night sweats, or weight loss.   EYES: No visual changes or eye pain ENT: No hearing changes.  No history of nose bleeds.   RESPIRATORY: No cough, wheezing and shortness of breath.   CARDIOVASCULAR: Negative for chest pain, and palpitations.   GI: Negative for abdominal discomfort, blood in stools or black stools.  No recent change in bowel habits.   GU:  No history of incontinence.   MUSCLOSKELETAL: + history of joint pain or swelling.  No myalgias.   SKIN: Negative for lesions, rash, and itching.   ENDOCRINE: Negative for cold or heat intolerance, polydipsia or goiter.   PSYCH:  No depression or anxiety symptoms.   NEURO: As Above.   Vital Signs:  BP 110/64 mmHg  Pulse 70  Wt 146 lb (66.225 kg)  SpO2 99%  Neurological Exam: MENTAL STATUS including orientation to time, place, person, recent and remote memory, attention span and concentration, language, and fund of knowledge is normal.  Speech is not dysarthric.  CRANIAL NERVES: Face is symmetric.  MOTOR:  Motor strength is 5/5 in all extremities.  No pronator drift.  Tone is normal.    MSRs:  Reflexes are 2+/4 in the upper extremities and absent in the lower extremities.   SENSORY: Absent vibration and pin prick distal to knees bilaterally. Proprioception is impaired. Sensation in the upper extremities is intact. Romberg sign is present.  COORDINATION/GAIT: Gait is moderately wide-based, but stable.    Data: CT head wo contrast 12/12/2011: Moderate small vessel ischemic change and atrophy. No acute intracranial  abnormality.   EMG 01/28/2013:  Nerve conduction studies done on both upper extremities shows evidence of a mild right carpal tunnel syndrome, and a borderline left carpal tunnel syndrome. There is diffuse prolongation of the sensory latencies in the upper extremities, and the possibility of an overlying peripheral neuropathy should be considered. The patient does report some numbness and burning sensations in the feet. EMG evaluation of the right upper extremity shows some mild distal chronic denervation consistent with a peripheral neuropathy, but no evidence of an overlying cervical radiculopathy. EMG evaluation of the left upper extremity shows findings consistent with a chronic stable C8 and C7 radiculopathy.   MRI cervical spine 10/21/2013: 1. No acute findings in the cervical spine.  2. Advanced degeneration of C5-C6 and C6-C7 in the setting of C3 through C5 posterior element ankylosis (congenital or degenerative). Chronic spinal  stenosis with spinal cord mass effect and abnormal cord signal (myelomalacia) at the C4-C5 through C5-C6 levels. Associated severe biforaminal stenosis at these levels. The appearance has not significantly changed since 2014.  3. Advanced degenerative changes at the anterior C1-C2 articulation and at C2-C3 are stable, with foraminal stenosis but no spinal stenosis.  4. Chronic mild anterolisthesis at C7-T1 with stable degenerative changes resulting in severe C8 biforaminal stenosis.  Labs 10/10/2013:  Mg 1.9, vitamin B12 682, copper 99, Cr 1.22*, TSH 4.7*  EMG upper extremities 12/01/2013: 1. Right median neuropathy, at or distal to the wrist consistent with the clinical diagnosis of carpal tunnel syndrome. Overall, these findings are mild to moderate in degree electrically. 2. 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 3. Chronic C7 radiculopathy affecting bilateral upper extremities, moderate in degree  electrically.  4. Chronic C6 radiculopathy affecting the left upper extremity, mild-to-moderate in degree electrically.   IMPRESSION/PLAN: Ms. Pawlicki is 80 year-old female returning for evaluation of bilateral hand paresthesias and pain. Her neurological examination is consistent with a large fiber peripheral neuropathy, conforming to a length dependent pattern. Imaging of her cervical spine shows advanced degenerative changes and severe spinal canal stenosis with myelomalacia at C4-5 through C5-6 levels with associated biforaminal stenosis. These abnormalities appear to be stable from 2014. EMG of the arms showed chronic C6-7 radiculopathy, right median neuropathy, and bilateral ulnar neuropathy.  I explained that although injury to her neck may have contributed to spinal canal stenosis, the degree of arthritis has probably been long-standing and possibly worsened by the injury. We again reviewed her images of the cervical and lumbar spine and reviewed management options.  She had no benefit with PT or injections.  Based on the severity of symptoms, I offered her a referral to neurosurgery for evaluation, however she is not interested in any surgery at this time.  Therefore, medical therapy is the only option.  For carpal tunnel syndrome, I would recommend that she start using a wrist splint.   In the meantime, I will try to optimize her gabapentin to provide symptomatic relief.  Increase gabapentin to 361m twice daily  From a home safety standpoint, she got a Life Alert System!   I also recommend a 4-wheeled rollator for her multifactorial gait instability (peripheral neuropathy and lumbosacral radiculopathy)  Return to clinic in 41-month   The duration of this appointment visit was 30 minutes of face-to-face time with the patient.  Greater than 50% of this time was spent in counseling, explanation of diagnosis, planning of further management, and coordination of care.   Thank you for  allowing me to participate in patient's care.  If I can answer any additional questions, I would be pleased to do so.    Sincerely,    Joanne Brander K. PaPosey ProntoDO

## 2014-02-25 ENCOUNTER — Telehealth: Payer: Self-pay | Admitting: Neurology

## 2014-02-25 NOTE — Telephone Encounter (Signed)
Pt wants to know when a appt is that we set up for her please call her at [phone]

## 2014-02-25 NOTE — Telephone Encounter (Signed)
I called patient and informed her that I have not set up any appointments for her.  She said that she remembered the appointment was for [facility].

## 2014-03-28 ENCOUNTER — Encounter (HOSPITAL_COMMUNITY): Payer: Self-pay | Admitting: Cardiology

## 2014-03-28 ENCOUNTER — Emergency Department (HOSPITAL_COMMUNITY)
Admission: EM | Admit: 2014-03-28 | Discharge: 2014-03-28 | Disposition: A | Discharge status: Home / Self Care | Payer: PPO | Attending: Emergency Medicine | Admitting: Emergency Medicine

## 2014-03-28 DIAGNOSIS — M79661 Pain in right lower leg: Secondary | ICD-10-CM | POA: Insufficient documentation

## 2014-03-28 DIAGNOSIS — M810 Age-related osteoporosis without current pathological fracture: Secondary | ICD-10-CM | POA: Diagnosis not present

## 2014-03-28 DIAGNOSIS — Z8639 Personal history of other endocrine, nutritional and metabolic disease: Secondary | ICD-10-CM | POA: Insufficient documentation

## 2014-03-28 DIAGNOSIS — J449 Chronic obstructive pulmonary disease, unspecified: Secondary | ICD-10-CM | POA: Diagnosis not present

## 2014-03-28 DIAGNOSIS — G8929 Other chronic pain: Secondary | ICD-10-CM | POA: Diagnosis not present

## 2014-03-28 DIAGNOSIS — N189 Chronic kidney disease, unspecified: Secondary | ICD-10-CM | POA: Diagnosis not present

## 2014-03-28 DIAGNOSIS — Z87828 Personal history of other (healed) physical injury and trauma: Secondary | ICD-10-CM | POA: Insufficient documentation

## 2014-03-28 DIAGNOSIS — Z86718 Personal history of other venous thrombosis and embolism: Secondary | ICD-10-CM | POA: Diagnosis not present

## 2014-03-28 DIAGNOSIS — I129 Hypertensive chronic kidney disease with stage 1 through stage 4 chronic kidney disease, or unspecified chronic kidney disease: Secondary | ICD-10-CM | POA: Insufficient documentation

## 2014-03-28 DIAGNOSIS — Z791 Long term (current) use of non-steroidal anti-inflammatories (NSAID): Secondary | ICD-10-CM | POA: Diagnosis not present

## 2014-03-28 DIAGNOSIS — K219 Gastro-esophageal reflux disease without esophagitis: Secondary | ICD-10-CM | POA: Diagnosis not present

## 2014-03-28 DIAGNOSIS — Z8543 Personal history of malignant neoplasm of ovary: Secondary | ICD-10-CM | POA: Diagnosis not present

## 2014-03-28 DIAGNOSIS — M25551 Pain in right hip: Secondary | ICD-10-CM | POA: Diagnosis not present

## 2014-03-28 DIAGNOSIS — Z872 Personal history of diseases of the skin and subcutaneous tissue: Secondary | ICD-10-CM | POA: Diagnosis not present

## 2014-03-28 DIAGNOSIS — Z86010 Personal history of colonic polyps: Secondary | ICD-10-CM | POA: Diagnosis not present

## 2014-03-28 DIAGNOSIS — F419 Anxiety disorder, unspecified: Secondary | ICD-10-CM | POA: Insufficient documentation

## 2014-03-28 DIAGNOSIS — Z79899 Other long term (current) drug therapy: Secondary | ICD-10-CM | POA: Diagnosis not present

## 2014-03-28 DIAGNOSIS — M545 Low back pain: Secondary | ICD-10-CM | POA: Diagnosis not present

## 2014-03-28 MED ORDER — OXYCODONE-ACETAMINOPHEN 5-325 MG PO TABS
1.0000 | ORAL_TABLET | Freq: Once | ORAL | Status: AC
  Administered 2014-03-28: 1 via ORAL
  Filled 2014-03-28: qty 1

## 2014-03-28 MED ORDER — OXYCODONE-ACETAMINOPHEN 5-325 MG PO TABS: 1.0000 | ORAL_TABLET | Freq: Four times a day (QID) | ORAL | Status: DC | PRN

## 2014-03-28 NOTE — Discharge Instructions (Signed)
Return to the ED with any concerns including swelling of legs, weakness of legs, not able to urinate, loss of control or bowel or bladder, decreased level of alertness/lethargy, or any other alarming symptoms

## 2014-03-28 NOTE — ED Provider Notes (Signed)
CSN: [id number]     Arrival date & time 03/28/14  1314 History   First MD Initiated Contact with Patient 03/28/14 1644     Chief Complaint  Patient presents with  . Leg Pain  . Hip Pain     (Consider location/radiation/quality/duration/timing/severity/associated sxs/prior Treatment) HPI  Pt with longstanding history of low back and right lower extremity pain after a fall 2 years ago presents with increased pain in low back, right hip and right lower extremity after acupuncture performed in right hip 2 days ago.  She states her pain is always present but has become worse after the procedure she had 2 days ago.  Pain causes her difficulty with walking and with doing her daily activities. She has been taking tramadol and alleve without much relief.  She states she is following up with her orthopedic doctor in 2 weeks and they will decide what the next step will be.  Pain is constant and severe.  There are no other associated systemic symptoms, there are no other alleviating or modifying factors.   Past Medical History  Diagnosis Date  . Cancer     ovarian  . Peripheral vascular disease   . Restless leg syndrome   . Fall Oct. 13, 2013    [place] in store- from [employer]  . Hypertension   . GERD (gastroesophageal reflux disease)   . DVT (deep venous thrombosis)     right leg  . Varicose vein   . Anxiety   . Peripheral neuropathy   . Dyslipidemia   . Ovarian cancer   . Colon polyp   . Osteoporosis   . Incontinence of urine   . COPD (chronic obstructive pulmonary disease)   . Esophageal dysmotility   . Zenker's diverticulum     small  . Decreased GFR   . Chronic kidney disease   . Unspecified disorder of skin and subcutaneous tissue   . DJD (degenerative joint disease) of cervical spine   . Radiculopathy   . CTS (carpal tunnel syndrome)    Past Surgical History  Procedure Laterality Date  . Vein surgery  1950  . Abdominal aortagram  Feb. 20, 2015  . Abdominal aortagram N/A  05/02/2013    Procedure: ABDOMINAL Maxcine Ham;  Surgeon: Elam Dutch, MD;  Location: [facility] CATH LAB;  Service: Cardiovascular;  Laterality: N/A;   Family History  Problem Relation Age of Onset  . Hypertension Mother   . Lung cancer Son     Deceased, 12  . Healthy Son   . Other Father     MVA  . Hypertension Brother   . Kidney disease Sister    History  Substance Use Topics  . Smoking status: Never Smoker   . Smokeless tobacco: Never Used  . Alcohol Use: No   OB History    No data available     Review of Systems  ROS reviewed and all otherwise negative except for mentioned in HPI    Allergies  Aspirin; Formaldehyde; and Sodium pentobarbital  Home Medications   Prior to Admission medications   Medication Sig Start Date End Date Taking? Authorizing Provider  alendronate (FOSAMAX) 70 MG tablet  02/19/14   Historical Provider, MD  amLODipine (NORVASC) 5 MG tablet Take 5 mg by mouth daily.    Historical Provider, MD  atenolol (TENORMIN) 25 MG tablet Take 12.5 mg by mouth daily.    Historical Provider, MD  B Complex-Biotin-FA (B-COMPLEX PO) Take 1 tablet by mouth daily.    Historical  Provider, MD  Blood Glucose Monitoring Suppl (BLOOD GLUCOSE METER KIT AND SUPPLIES) KIT Dispense based on patient and insurance preference. Use up to four times daily as directed. (FOR ICD-9 250.00, 250.01). 11/21/13   Alda Berthold, DO  Cholecalciferol (VITAMIN D) 2000 UNITS tablet Take 2,000 Units by mouth daily.    Historical Provider, MD  clopidogrel (PLAVIX) 75 MG tablet TAKE 1 TABLET BY MOUTH EVERY DAY 12/08/13   Elam Dutch, MD  Cyanocobalamin (B-12 PO) Take 1 tablet by mouth daily.    Historical Provider, MD  diazepam (VALIUM) 5 MG tablet Take 5 mg by mouth daily as needed for anxiety or sedation.    Historical Provider, MD  diphenhydrAMINE (BENADRYL) 25 MG tablet Take 25 mg by mouth at bedtime as needed for sleep.    Historical Provider, MD  ESTRACE VAGINAL 0.1 MG/GM vaginal cream   10/13/13   Historical Provider, MD  estradiol (ESTRACE) 0.5 MG tablet Take 0.5 mg by mouth daily.    Historical Provider, MD  famotidine (PEPCID) 20 MG tablet Take 20 mg by mouth daily.    Historical Provider, MD  gabapentin (NEURONTIN) 300 MG capsule Take 1 capsule (300 mg total) by mouth 2 (two) times daily. 02/20/14   Donika Keith Rake, DO  hydrochlorothiazide (HYDRODIURIL) 25 MG tablet Take 12.5 mg by mouth daily.  02/24/13   Historical Provider, MD  meloxicam (MOBIC) 15 MG tablet  11/19/13   Historical Provider, MD  Misc. Devices (ROLLATOR ULTRA-LIGHT) MISC 1 each by Does not apply route daily. 02/20/14   Alda Berthold, DO  nabumetone (RELAFEN) 750 MG tablet  01/06/14   Historical Provider, MD  Omega-3 Fatty Acids (FISH OIL PO) Take 2 capsules by mouth daily.    Historical Provider, MD  oxyCODONE-acetaminophen (PERCOCET/ROXICET) 5-325 MG per tablet Take 1 tablet by mouth every 6 (six) hours as needed for severe pain. 03/28/14   Threasa Beards, MD  PROAIR HFA 108 (90 BASE) MCG/ACT inhaler as needed. 08/30/13   Historical Provider, MD  sertraline (ZOLOFT) 25 MG tablet Take 1 tablet by mouth daily. 03/25/13   Historical Provider, MD  traMADol-acetaminophen Caroline Sauger) 37.5-325 MG per tablet  01/30/14   Historical Provider, MD  traZODone (DESYREL) 50 MG tablet Take 50 mg by mouth at bedtime.    Historical Provider, MD  VITAMIN E PO Take 2 capsules by mouth daily.    Historical Provider, MD   BP 155/71 mmHg  Pulse 60  Temp(Src) 97.9 F (36.6 C)  Resp 12  Ht '5\' 5"'  (1.651 m)  Wt 146 lb (66.225 kg)  BMI 24.30 kg/m2  SpO2 100%  Vitals reviewed Physical Exam  Physical Examination: General appearance - alert, well appearing, and in no distress Mental status - alert, oriented to person, place, and time Eyes - no conjunctival injection, no scleral icterus Mouth - mucous membranes moist, pharynx normal without lesions Chest - clear to auscultation, no wheezes, rales or rhonchi, symmetric air  entry Heart - normal rate, regular rhythm, normal S1, S2, no murmurs, rubs, clicks or gallops Neurological - alert, oriented x 3, strength 5/5 in extremities x 4, sensation intact Back- ttp in right lumbar region, no CVA tenderness, small amount of bruising overlying injection site  Musculoskeletal - ttp over posterior and lateral right hip, otherwise no joint tenderness, deformity or swelling Extremities - peripheral pulses normal, no pedal edema, no clubbing or cyanosis Skin - normal coloration and turgor, no rashes  ED Course  Procedures (including critical care time)  Labs Review Labs Reviewed - No data to display  Imaging Review No results found.   EKG Interpretation None      MDM   Final diagnoses:  Chronic low back pain  Chronic hip pain, right    Pt presenting with c/o worsening of her chronic pain.  Pt requesting pain meds.  No indication for imaging at this time.  No fevers to suggest abscess from injections.  Pt will followup with her orthopedic physician.  Discharged with strict return precautions.  Pt agreeable with plan.    Threasa Beards, MD 03/28/14 [phone]

## 2014-03-28 NOTE — ED Notes (Signed)
Pt is stable upon d/c. Pt escorted via wheelchair from ED by this RN.

## 2014-03-28 NOTE — ED Notes (Signed)
Pt reports that she fell about 2 years ago and hurt her right side. States that she has been having pain in the right shin and right hip. States that she has been seeing a ortho MD for this and they are going to "kill the nerve", but have not done it yet.

## 2014-03-30 ENCOUNTER — Other Ambulatory Visit: Payer: Self-pay | Admitting: *Deleted

## 2014-04-20 ENCOUNTER — Other Ambulatory Visit: Payer: Self-pay | Admitting: Orthopedic Surgery

## 2014-04-21 NOTE — Telephone Encounter (Signed)
error 

## 2014-05-01 ENCOUNTER — Ambulatory Visit (HOSPITAL_COMMUNITY)
Admission: RE | Admit: 2014-05-01 | Discharge: 2014-05-01 | Disposition: A | Discharge status: Home / Self Care | Payer: PPO | Source: Ambulatory Visit | Attending: Orthopedic Surgery | Admitting: Orthopedic Surgery

## 2014-05-01 ENCOUNTER — Encounter (HOSPITAL_COMMUNITY)
Admission: RE | Admit: 2014-05-01 | Discharge: 2014-05-01 | Disposition: A | Discharge status: Home / Self Care | Payer: PPO | Source: Ambulatory Visit | Attending: Orthopedic Surgery | Admitting: Orthopedic Surgery

## 2014-05-01 ENCOUNTER — Encounter (HOSPITAL_COMMUNITY): Payer: Self-pay

## 2014-05-01 DIAGNOSIS — M4854XA Collapsed vertebra, not elsewhere classified, thoracic region, initial encounter for fracture: Secondary | ICD-10-CM | POA: Insufficient documentation

## 2014-05-01 DIAGNOSIS — Z01812 Encounter for preprocedural laboratory examination: Secondary | ICD-10-CM | POA: Insufficient documentation

## 2014-05-01 DIAGNOSIS — Z01818 Encounter for other preprocedural examination: Secondary | ICD-10-CM | POA: Insufficient documentation

## 2014-05-01 DIAGNOSIS — Z01810 Encounter for preprocedural cardiovascular examination: Secondary | ICD-10-CM | POA: Diagnosis not present

## 2014-05-01 HISTORY — DX: Pneumonia, unspecified organism: J18.9

## 2014-05-01 HISTORY — DX: Major depressive disorder, single episode, unspecified: F32.9

## 2014-05-01 HISTORY — DX: Nocturia: R35.1

## 2014-05-01 HISTORY — DX: Other chronic pain: G89.29

## 2014-05-01 HISTORY — DX: Frequency of micturition: R35.0

## 2014-05-01 HISTORY — DX: Cervical disc disorder, unspecified, unspecified cervical region: M50.90

## 2014-05-01 HISTORY — DX: Insomnia, unspecified: G47.00

## 2014-05-01 HISTORY — DX: Polyneuropathy, unspecified: G62.9

## 2014-05-01 HISTORY — DX: Depression, unspecified: F32.A

## 2014-05-01 HISTORY — DX: Dorsalgia, unspecified: M54.9

## 2014-05-01 LAB — CBC WITH DIFFERENTIAL/PLATELET
BASOS ABS: 0 10*3/uL (ref 0.0–0.1)
Basophils Relative: 0 % (ref 0–1)
EOS PCT: 1 % (ref 0–5)
Eosinophils Absolute: 0.1 10*3/uL (ref 0.0–0.7)
HCT: 42.4 % (ref 36.0–46.0)
Hemoglobin: 14.2 g/dL (ref 12.0–15.0)
LYMPHS PCT: 45 % (ref 12–46)
Lymphs Abs: 2.4 10*3/uL (ref 0.7–4.0)
MCH: 28.9 pg (ref 26.0–34.0)
MCHC: 33.5 g/dL (ref 30.0–36.0)
MCV: 86.4 fL (ref 78.0–100.0)
Monocytes Absolute: 0.5 10*3/uL (ref 0.1–1.0)
Monocytes Relative: 9 % (ref 3–12)
NEUTROS PCT: 45 % (ref 43–77)
Neutro Abs: 2.3 10*3/uL (ref 1.7–7.7)
PLATELETS: 205 10*3/uL (ref 150–400)
RBC: 4.91 MIL/uL (ref 3.87–5.11)
RDW: 13.1 % (ref 11.5–15.5)
WBC: 5.3 10*3/uL (ref 4.0–10.5)

## 2014-05-01 LAB — SURGICAL PCR SCREEN
MRSA, PCR: NEGATIVE
Staphylococcus aureus: NEGATIVE

## 2014-05-01 LAB — TYPE AND SCREEN
ABO/RH(D): A POS
ANTIBODY SCREEN: NEGATIVE

## 2014-05-01 LAB — COMPREHENSIVE METABOLIC PANEL
ALT: 24 U/L (ref 0–35)
ANION GAP: 9 (ref 5–15)
AST: 28 U/L (ref 0–37)
Albumin: 4.1 g/dL (ref 3.5–5.2)
Alkaline Phosphatase: 47 U/L (ref 39–117)
BILIRUBIN TOTAL: 0.8 mg/dL (ref 0.3–1.2)
BUN: 16 mg/dL (ref 6–23)
CHLORIDE: 100 mmol/L (ref 96–112)
CO2: 27 mmol/L (ref 19–32)
Calcium: 9.2 mg/dL (ref 8.4–10.5)
Creatinine, Ser: 1.23 mg/dL — ABNORMAL HIGH (ref 0.50–1.10)
GFR calc non Af Amer: 39 mL/min — ABNORMAL LOW (ref >90)
GFR, EST AFRICAN AMERICAN: 45 mL/min — AB (ref >90)
GLUCOSE: 111 mg/dL — AB (ref 70–99)
Potassium: 3.4 mmol/L — ABNORMAL LOW (ref 3.5–5.1)
Sodium: 136 mmol/L (ref 135–145)
TOTAL PROTEIN: 6.4 g/dL (ref 6.0–8.3)

## 2014-05-01 LAB — URINALYSIS, ROUTINE W REFLEX MICROSCOPIC
BILIRUBIN URINE: NEGATIVE
GLUCOSE, UA: NEGATIVE mg/dL
HGB URINE DIPSTICK: NEGATIVE
Ketones, ur: NEGATIVE mg/dL
Nitrite: NEGATIVE
Protein, ur: NEGATIVE mg/dL
Specific Gravity, Urine: 1.025 (ref 1.005–1.030)
Urobilinogen, UA: 0.2 mg/dL (ref 0.0–1.0)
pH: 5.5 (ref 5.0–8.0)

## 2014-05-01 LAB — APTT: aPTT: 29 seconds (ref 24–37)

## 2014-05-01 LAB — URINE MICROSCOPIC-ADD ON

## 2014-05-01 LAB — ABO/RH: ABO/RH(D): A POS

## 2014-05-01 LAB — PROTIME-INR
INR: 1.03 (ref 0.00–1.49)
Prothrombin Time: 13.6 seconds (ref 11.6–15.2)

## 2014-05-01 MED ORDER — POVIDONE-IODINE 7.5 % EX SOLN: Freq: Once | CUTANEOUS | Status: DC

## 2014-05-01 NOTE — Progress Notes (Addendum)
Medical Md is Dr.Karrar Lysle Rubens  Pt doesn't have a cardiologist  Stress test done >41yrs ago  Denies ever having an echo   Heart cath in epic from 2004  Denies EKG or CXR in past yr

## 2014-05-01 NOTE — Pre-Procedure Instructions (Signed)
EBONIQUE HALLSTROM  05/01/2014   Your procedure is scheduled on:  Wed, Feb 24 @ 8:30 AM  Report to [facility]  at 6:30 AM.  Call this number if you have problems the morning of surgery: [phone]   Remember:   Do not eat food or drink liquids after midnight.   Take these medicines the morning of surgery with A SIP OF WATER: Amlodipine(Norvasc),Atenolol(Tenormin),Valim(Diazepam),Famotidine(Pepcid),Gabapentin(Neurontin),Pain Pill(if needed),ProAir<Bring Your Inhaler With You>,and Zoloft(Sertraline)               Stop taking your Vit E,Fish Oil,Mobic,and Plavix as well as any Vitamins or Herbal Medications. No Goody's,BC's,Aleve,or Ibuprofen.    Do not wear jewelry, make-up or nail polish.  Do not wear lotions, powders, or perfumes. You may wear deodorant.  Do not shave 48 hours prior to surgery.   Do not bring valuables to the hospital.  [facility] is not responsible                  for any belongings or valuables.               Contacts, dentures or bridgework may not be worn into surgery.  Leave suitcase in the car. After surgery it may be brought to your room.  For patients admitted to the hospital, discharge time is determined by your                treatment team.                Special Instructions:  [place] - Preparing for Surgery  Before surgery, you can play an important role.  Because skin is not sterile, your skin needs to be as free of germs as possible.  You can reduce the number of germs on you skin by washing with CHG (chlorahexidine gluconate) soap before surgery.  CHG is an antiseptic cleaner which kills germs and bonds with the skin to continue killing germs even after washing.  Please DO NOT use if you have an allergy to CHG or antibacterial soaps.  If your skin becomes reddened/irritated stop using the CHG and inform your nurse when you arrive at Short Stay.  Do not shave (including legs and underarms) for at least 48 hours prior to the first CHG  shower.  You may shave your face.  Please follow these instructions carefully:   1.  Shower with CHG Soap the night before surgery and the                                morning of Surgery.  2.  If you choose to wash your hair, wash your hair first as usual with your       normal shampoo.  3.  After you shampoo, rinse your hair and body thoroughly to remove the                      Shampoo.  4.  Use CHG as you would any other liquid soap.  You can apply chg directly       to the skin and wash gently with scrungie or a clean washcloth.  5.  Apply the CHG Soap to your body ONLY FROM THE NECK DOWN.        Do not use on open wounds or open sores.  Avoid contact with your eyes,       ears, mouth and  genitals (private parts).  Wash genitals (private parts)       with your normal soap.  6.  Wash thoroughly, paying special attention to the area where your surgery        will be performed.  7.  Thoroughly rinse your body with warm water from the neck down.  8.  DO NOT shower/wash with your normal soap after using and rinsing off       the CHG Soap.  9.  Pat yourself dry with a clean towel.            10.  Wear clean pajamas.            11.  Place clean sheets on your bed the night of your first shower and do not        sleep with pets.  Day of Surgery  Do not apply any lotions/deoderants the morning of surgery.  Please wear clean clothes to the hospital/surgery center.     Please read over the following fact sheets that you were given: Pain Booklet, Coughing and Deep Breathing, Blood Transfusion Information, MRSA Information and Surgical Site Infection Prevention

## 2014-05-05 MED ORDER — CEFAZOLIN SODIUM-DEXTROSE 2-3 GM-% IV SOLR
2.0000 g | INTRAVENOUS | Status: AC
  Administered 2014-05-06: 2 g via INTRAVENOUS
  Filled 2014-05-05: qty 50

## 2014-05-06 ENCOUNTER — Inpatient Hospital Stay (HOSPITAL_COMMUNITY): Payer: PPO | Admitting: Anesthesiology

## 2014-05-06 ENCOUNTER — Inpatient Hospital Stay (HOSPITAL_COMMUNITY): Payer: PPO

## 2014-05-06 ENCOUNTER — Encounter (HOSPITAL_COMMUNITY): Payer: Self-pay | Admitting: *Deleted

## 2014-05-06 ENCOUNTER — Encounter (HOSPITAL_COMMUNITY)
Admission: RE | Disposition: A | Discharge status: Skilled Nursing Facility | Payer: PPO | Source: Ambulatory Visit | Attending: Orthopedic Surgery

## 2014-05-06 ENCOUNTER — Inpatient Hospital Stay (HOSPITAL_COMMUNITY)
Admission: RE | Admit: 2014-05-06 | Discharge: 2014-05-09 | DRG: 460 | Disposition: A | Discharge status: Skilled Nursing Facility | Payer: PPO | Source: Ambulatory Visit | Attending: Orthopedic Surgery | Admitting: Orthopedic Surgery

## 2014-05-06 DIAGNOSIS — F329 Major depressive disorder, single episode, unspecified: Secondary | ICD-10-CM | POA: Diagnosis present

## 2014-05-06 DIAGNOSIS — Z79899 Other long term (current) drug therapy: Secondary | ICD-10-CM | POA: Diagnosis not present

## 2014-05-06 DIAGNOSIS — G629 Polyneuropathy, unspecified: Secondary | ICD-10-CM | POA: Diagnosis present

## 2014-05-06 DIAGNOSIS — K219 Gastro-esophageal reflux disease without esophagitis: Secondary | ICD-10-CM | POA: Diagnosis present

## 2014-05-06 DIAGNOSIS — F419 Anxiety disorder, unspecified: Secondary | ICD-10-CM | POA: Diagnosis present

## 2014-05-06 DIAGNOSIS — I129 Hypertensive chronic kidney disease with stage 1 through stage 4 chronic kidney disease, or unspecified chronic kidney disease: Secondary | ICD-10-CM | POA: Diagnosis present

## 2014-05-06 DIAGNOSIS — I739 Peripheral vascular disease, unspecified: Secondary | ICD-10-CM | POA: Diagnosis present

## 2014-05-06 DIAGNOSIS — N189 Chronic kidney disease, unspecified: Secondary | ICD-10-CM | POA: Diagnosis present

## 2014-05-06 DIAGNOSIS — Z8701 Personal history of pneumonia (recurrent): Secondary | ICD-10-CM | POA: Diagnosis not present

## 2014-05-06 DIAGNOSIS — J449 Chronic obstructive pulmonary disease, unspecified: Secondary | ICD-10-CM | POA: Diagnosis present

## 2014-05-06 DIAGNOSIS — Z86718 Personal history of other venous thrombosis and embolism: Secondary | ICD-10-CM | POA: Diagnosis not present

## 2014-05-06 DIAGNOSIS — G2581 Restless legs syndrome: Secondary | ICD-10-CM | POA: Diagnosis present

## 2014-05-06 DIAGNOSIS — E785 Hyperlipidemia, unspecified: Secondary | ICD-10-CM | POA: Diagnosis present

## 2014-05-06 DIAGNOSIS — Z419 Encounter for procedure for purposes other than remedying health state, unspecified: Secondary | ICD-10-CM

## 2014-05-06 DIAGNOSIS — G4700 Insomnia, unspecified: Secondary | ICD-10-CM | POA: Diagnosis present

## 2014-05-06 DIAGNOSIS — Z7902 Long term (current) use of antithrombotics/antiplatelets: Secondary | ICD-10-CM

## 2014-05-06 DIAGNOSIS — Z8543 Personal history of malignant neoplasm of ovary: Secondary | ICD-10-CM | POA: Diagnosis not present

## 2014-05-06 DIAGNOSIS — M4806 Spinal stenosis, lumbar region: Principal | ICD-10-CM | POA: Diagnosis present

## 2014-05-06 DIAGNOSIS — M5410 Radiculopathy, site unspecified: Secondary | ICD-10-CM | POA: Diagnosis present

## 2014-05-06 DIAGNOSIS — M810 Age-related osteoporosis without current pathological fracture: Secondary | ICD-10-CM | POA: Diagnosis present

## 2014-05-06 DIAGNOSIS — M5116 Intervertebral disc disorders with radiculopathy, lumbar region: Secondary | ICD-10-CM | POA: Diagnosis present

## 2014-05-06 DIAGNOSIS — M79604 Pain in right leg: Secondary | ICD-10-CM | POA: Diagnosis present

## 2014-05-06 SURGERY — POSTERIOR LUMBAR FUSION 1 LEVEL
Anesthesia: General | Laterality: Right

## 2014-05-06 MED ORDER — BUPIVACAINE-EPINEPHRINE 0.25% -1:200000 IJ SOLN
INTRAMUSCULAR | Status: DC | PRN
  Administered 2014-05-06: 10 mL

## 2014-05-06 MED ORDER — ALUM & MAG HYDROXIDE-SIMETH 200-200-20 MG/5ML PO SUSP: 30.0000 mL | Freq: Four times a day (QID) | ORAL | Status: DC | PRN

## 2014-05-06 MED ORDER — 0.9 % SODIUM CHLORIDE (POUR BTL) OPTIME
TOPICAL | Status: DC | PRN
  Administered 2014-05-06: 1000 mL

## 2014-05-06 MED ORDER — THROMBIN 20000 UNITS EX SOLR
CUTANEOUS | Status: AC
  Filled 2014-05-06: qty 40000

## 2014-05-06 MED ORDER — ROCURONIUM BROMIDE 100 MG/10ML IV SOLN
INTRAVENOUS | Status: DC | PRN
  Administered 2014-05-06: 10 mg via INTRAVENOUS
  Administered 2014-05-06: 40 mg via INTRAVENOUS

## 2014-05-06 MED ORDER — FENTANYL CITRATE 0.05 MG/ML IJ SOLN
INTRAMUSCULAR | Status: AC
  Filled 2014-05-06: qty 5

## 2014-05-06 MED ORDER — OXYCODONE-ACETAMINOPHEN 5-325 MG PO TABS
1.0000 | ORAL_TABLET | ORAL | Status: DC | PRN
  Administered 2014-05-07 (×2): 1 via ORAL
  Administered 2014-05-07: 2 via ORAL
  Filled 2014-05-06: qty 2
  Filled 2014-05-06 (×2): qty 1

## 2014-05-06 MED ORDER — BUPIVACAINE-EPINEPHRINE (PF) 0.25% -1:200000 IJ SOLN
INTRAMUSCULAR | Status: AC
  Filled 2014-05-06: qty 30

## 2014-05-06 MED ORDER — ALBUTEROL SULFATE HFA 108 (90 BASE) MCG/ACT IN AERS: 2.0000 | INHALATION_SPRAY | Freq: Four times a day (QID) | RESPIRATORY_TRACT | Status: DC | PRN

## 2014-05-06 MED ORDER — GLYCOPYRROLATE 0.2 MG/ML IJ SOLN
INTRAMUSCULAR | Status: AC
  Filled 2014-05-06: qty 3

## 2014-05-06 MED ORDER — MORPHINE SULFATE (PF) 1 MG/ML IV SOLN
INTRAVENOUS | Status: DC
  Administered 2014-05-06: 15:00:00 via INTRAVENOUS
  Administered 2014-05-06: 4 mg via INTRAVENOUS
  Administered 2014-05-07: 0 mg via INTRAVENOUS
  Administered 2014-05-07: 1 mg via INTRAVENOUS
  Administered 2014-05-07: 0 mg via INTRAVENOUS

## 2014-05-06 MED ORDER — DEXTROSE 5 % IV SOLN
INTRAVENOUS | Status: DC | PRN
  Administered 2014-05-06: 09:00:00 via INTRAVENOUS

## 2014-05-06 MED ORDER — MORPHINE SULFATE (PF) 1 MG/ML IV SOLN
INTRAVENOUS | Status: AC
  Filled 2014-05-06: qty 25

## 2014-05-06 MED ORDER — ARTIFICIAL TEARS OP OINT
TOPICAL_OINTMENT | OPHTHALMIC | Status: AC
  Filled 2014-05-06: qty 3.5

## 2014-05-06 MED ORDER — SODIUM CHLORIDE 0.9 % IJ SOLN: 9.0000 mL | INTRAMUSCULAR | Status: DC | PRN

## 2014-05-06 MED ORDER — SODIUM CHLORIDE 0.9 % IV SOLN
INTRAVENOUS | Status: DC
  Administered 2014-05-06 – 2014-05-08 (×3): via INTRAVENOUS

## 2014-05-06 MED ORDER — ONDANSETRON HCL 4 MG/2ML IJ SOLN: 4.0000 mg | Freq: Once | INTRAMUSCULAR | Status: DC | PRN

## 2014-05-06 MED ORDER — GLYCOPYRROLATE 0.2 MG/ML IJ SOLN
INTRAMUSCULAR | Status: DC | PRN
  Administered 2014-05-06: 0.6 mg via INTRAVENOUS

## 2014-05-06 MED ORDER — PROPOFOL 10 MG/ML IV BOLUS
INTRAVENOUS | Status: AC
  Filled 2014-05-06: qty 20

## 2014-05-06 MED ORDER — PHENOL 1.4 % MT LIQD: 1.0000 | OROMUCOSAL | Status: DC | PRN

## 2014-05-06 MED ORDER — NEOSTIGMINE METHYLSULFATE 10 MG/10ML IV SOLN
INTRAVENOUS | Status: AC
  Filled 2014-05-06: qty 1

## 2014-05-06 MED ORDER — BISACODYL 5 MG PO TBEC: 5.0000 mg | DELAYED_RELEASE_TABLET | Freq: Every day | ORAL | Status: DC | PRN

## 2014-05-06 MED ORDER — ZOLPIDEM TARTRATE 5 MG PO TABS: 5.0000 mg | ORAL_TABLET | Freq: Every evening | ORAL | Status: DC | PRN

## 2014-05-06 MED ORDER — NALOXONE HCL 0.4 MG/ML IJ SOLN
0.4000 mg | INTRAMUSCULAR | Status: DC | PRN
  Filled 2014-05-06: qty 1

## 2014-05-06 MED ORDER — NABUMETONE 500 MG PO TABS
750.0000 mg | ORAL_TABLET | Freq: Every day | ORAL | Status: DC
  Administered 2014-05-07 – 2014-05-09 (×3): 750 mg via ORAL
  Filled 2014-05-06 (×6): qty 2

## 2014-05-06 MED ORDER — THROMBIN 20000 UNITS EX KIT
PACK | CUTANEOUS | Status: DC | PRN
  Administered 2014-05-06: 20 mL via TOPICAL

## 2014-05-06 MED ORDER — FLEET ENEMA 7-19 GM/118ML RE ENEM: 1.0000 | ENEMA | Freq: Once | RECTAL | Status: AC | PRN

## 2014-05-06 MED ORDER — LIDOCAINE HCL (CARDIAC) 20 MG/ML IV SOLN
INTRAVENOUS | Status: AC
  Filled 2014-05-06: qty 5

## 2014-05-06 MED ORDER — FENTANYL CITRATE 0.05 MG/ML IJ SOLN
25.0000 ug | INTRAMUSCULAR | Status: DC | PRN
  Administered 2014-05-06: 50 ug via INTRAVENOUS

## 2014-05-06 MED ORDER — MENTHOL 3 MG MT LOZG: 1.0000 | LOZENGE | OROMUCOSAL | Status: DC | PRN

## 2014-05-06 MED ORDER — HYDROCHLOROTHIAZIDE 25 MG PO TABS
25.0000 mg | ORAL_TABLET | Freq: Every day | ORAL | Status: DC
  Administered 2014-05-07 – 2014-05-09 (×3): 25 mg via ORAL
  Filled 2014-05-06 (×3): qty 1

## 2014-05-06 MED ORDER — NABUMETONE 750 MG PO TABS
750.0000 mg | ORAL_TABLET | Freq: Every day | ORAL | Status: DC
  Filled 2014-05-06: qty 1

## 2014-05-06 MED ORDER — PROPOFOL 10 MG/ML IV BOLUS
INTRAVENOUS | Status: DC | PRN
  Administered 2014-05-06: 130 mg via INTRAVENOUS

## 2014-05-06 MED ORDER — LACTATED RINGERS IV SOLN
INTRAVENOUS | Status: DC | PRN
  Administered 2014-05-06 (×3): via INTRAVENOUS

## 2014-05-06 MED ORDER — ACETAMINOPHEN 325 MG PO TABS: 650.0000 mg | ORAL_TABLET | ORAL | Status: DC | PRN

## 2014-05-06 MED ORDER — SENNOSIDES-DOCUSATE SODIUM 8.6-50 MG PO TABS: 1.0000 | ORAL_TABLET | Freq: Every evening | ORAL | Status: DC | PRN

## 2014-05-06 MED ORDER — ONDANSETRON HCL 4 MG/2ML IJ SOLN
4.0000 mg | Freq: Four times a day (QID) | INTRAMUSCULAR | Status: DC | PRN
  Filled 2014-05-06: qty 2

## 2014-05-06 MED ORDER — ACETAMINOPHEN 650 MG RE SUPP: 650.0000 mg | RECTAL | Status: DC | PRN

## 2014-05-06 MED ORDER — SODIUM CHLORIDE 0.9 % IJ SOLN: 3.0000 mL | INTRAMUSCULAR | Status: DC | PRN

## 2014-05-06 MED ORDER — FENTANYL CITRATE 0.05 MG/ML IJ SOLN
INTRAMUSCULAR | Status: DC | PRN
  Administered 2014-05-06: 50 ug via INTRAVENOUS
  Administered 2014-05-06: 25 ug via INTRAVENOUS
  Administered 2014-05-06: 50 ug via INTRAVENOUS
  Administered 2014-05-06: 100 ug via INTRAVENOUS
  Administered 2014-05-06: 25 ug via INTRAVENOUS

## 2014-05-06 MED ORDER — SUCCINYLCHOLINE CHLORIDE 20 MG/ML IJ SOLN
INTRAMUSCULAR | Status: DC | PRN
  Administered 2014-05-06: 80 mg via INTRAVENOUS

## 2014-05-06 MED ORDER — DOCUSATE SODIUM 100 MG PO CAPS
100.0000 mg | ORAL_CAPSULE | Freq: Two times a day (BID) | ORAL | Status: DC
  Administered 2014-05-06 – 2014-05-09 (×5): 100 mg via ORAL
  Filled 2014-05-06 (×6): qty 1

## 2014-05-06 MED ORDER — DIPHENHYDRAMINE HCL 12.5 MG/5ML PO ELIX
12.5000 mg | ORAL_SOLUTION | Freq: Four times a day (QID) | ORAL | Status: DC | PRN
  Filled 2014-05-06: qty 5

## 2014-05-06 MED ORDER — GABAPENTIN 300 MG PO CAPS
300.0000 mg | ORAL_CAPSULE | Freq: Three times a day (TID) | ORAL | Status: DC
  Administered 2014-05-06 – 2014-05-09 (×7): 300 mg via ORAL
  Filled 2014-05-06 (×8): qty 1

## 2014-05-06 MED ORDER — DIPHENHYDRAMINE HCL 50 MG/ML IJ SOLN
12.5000 mg | Freq: Four times a day (QID) | INTRAMUSCULAR | Status: DC | PRN
  Filled 2014-05-06: qty 0.25

## 2014-05-06 MED ORDER — SODIUM CHLORIDE 0.9 % IV SOLN: 250.0000 mL | INTRAVENOUS | Status: DC

## 2014-05-06 MED ORDER — SUCCINYLCHOLINE CHLORIDE 20 MG/ML IJ SOLN
INTRAMUSCULAR | Status: AC
  Filled 2014-05-06: qty 1

## 2014-05-06 MED ORDER — DIAZEPAM 5 MG PO TABS
5.0000 mg | ORAL_TABLET | Freq: Four times a day (QID) | ORAL | Status: DC | PRN
  Administered 2014-05-08: 5 mg via ORAL
  Filled 2014-05-06: qty 1

## 2014-05-06 MED ORDER — SERTRALINE HCL 25 MG PO TABS
25.0000 mg | ORAL_TABLET | Freq: Every day | ORAL | Status: DC
  Administered 2014-05-06 – 2014-05-09 (×4): 25 mg via ORAL
  Filled 2014-05-06 (×4): qty 1

## 2014-05-06 MED ORDER — ONDANSETRON HCL 4 MG/2ML IJ SOLN: 4.0000 mg | INTRAMUSCULAR | Status: DC | PRN

## 2014-05-06 MED ORDER — MORPHINE SULFATE 2 MG/ML IJ SOLN: 1.0000 mg | INTRAMUSCULAR | Status: DC | PRN

## 2014-05-06 MED ORDER — SODIUM CHLORIDE 0.9 % IJ SOLN
3.0000 mL | Freq: Two times a day (BID) | INTRAMUSCULAR | Status: DC
  Administered 2014-05-06 – 2014-05-08 (×2): 3 mL via INTRAVENOUS

## 2014-05-06 MED ORDER — PROPOFOL INFUSION 10 MG/ML OPTIME
INTRAVENOUS | Status: DC | PRN
  Administered 2014-05-06: 50 ug/kg/min via INTRAVENOUS

## 2014-05-06 MED ORDER — AMLODIPINE BESYLATE 5 MG PO TABS
5.0000 mg | ORAL_TABLET | Freq: Every day | ORAL | Status: DC
  Administered 2014-05-06 – 2014-05-09 (×4): 5 mg via ORAL
  Filled 2014-05-06 (×4): qty 1

## 2014-05-06 MED ORDER — TRAZODONE HCL 50 MG PO TABS
50.0000 mg | ORAL_TABLET | Freq: Every day | ORAL | Status: DC
  Administered 2014-05-06 – 2014-05-08 (×2): 50 mg via ORAL
  Filled 2014-05-06 (×3): qty 1

## 2014-05-06 MED ORDER — ONDANSETRON HCL 4 MG/2ML IJ SOLN
INTRAMUSCULAR | Status: AC
  Filled 2014-05-06: qty 2

## 2014-05-06 MED ORDER — METHYLENE BLUE 1 % INJ SOLN
INTRAMUSCULAR | Status: AC
  Filled 2014-05-06: qty 10

## 2014-05-06 MED ORDER — LIDOCAINE HCL (CARDIAC) 20 MG/ML IV SOLN
INTRAVENOUS | Status: DC | PRN
  Administered 2014-05-06: 30 mg via INTRAVENOUS

## 2014-05-06 MED ORDER — CEFAZOLIN SODIUM 1-5 GM-% IV SOLN
1.0000 g | Freq: Three times a day (TID) | INTRAVENOUS | Status: AC
  Administered 2014-05-06 – 2014-05-07 (×2): 1 g via INTRAVENOUS
  Filled 2014-05-06 (×2): qty 50

## 2014-05-06 MED ORDER — MIDAZOLAM HCL 2 MG/2ML IJ SOLN
INTRAMUSCULAR | Status: AC
  Filled 2014-05-06: qty 2

## 2014-05-06 MED ORDER — NEOSTIGMINE METHYLSULFATE 10 MG/10ML IV SOLN
INTRAVENOUS | Status: DC | PRN
  Administered 2014-05-06: 4 mg via INTRAVENOUS

## 2014-05-06 MED ORDER — FENTANYL CITRATE 0.05 MG/ML IJ SOLN
INTRAMUSCULAR | Status: AC
  Filled 2014-05-06: qty 2

## 2014-05-06 MED ORDER — ROCURONIUM BROMIDE 50 MG/5ML IV SOLN
INTRAVENOUS | Status: AC
  Filled 2014-05-06: qty 1

## 2014-05-06 MED ORDER — ATENOLOL 12.5 MG HALF TABLET
12.5000 mg | ORAL_TABLET | Freq: Every day | ORAL | Status: DC
  Administered 2014-05-07 – 2014-05-09 (×3): 12.5 mg via ORAL
  Filled 2014-05-06 (×4): qty 1

## 2014-05-06 MED ORDER — ONDANSETRON HCL 4 MG/2ML IJ SOLN
INTRAMUSCULAR | Status: DC | PRN
  Administered 2014-05-06: 4 mg via INTRAVENOUS

## 2014-05-06 SURGICAL SUPPLY — 89 items
APL SKNCLS STERI-STRIP NONHPOA (GAUZE/BANDAGES/DRESSINGS) ×1
BENZOIN TINCTURE PRP APPL 2/3 (GAUZE/BANDAGES/DRESSINGS) ×2 IMPLANT
BIT DRILL MOUNTAINER FXED 12MM (DRILL) IMPLANT
BLADE SURG ROTATE 9660 (MISCELLANEOUS) IMPLANT
BUR PRESCISION 1.7 ELITE (BURR) ×1 IMPLANT
BUR ROUND PRECISION 4.0 (BURR) ×2 IMPLANT
CAGE CONCORDE BULLET 9X11X27 (Cage) ×2 IMPLANT
CAGE SPNL PRLL BLT NOSE 27X9 (Cage) IMPLANT
CARTRIDGE OIL MAESTRO DRILL (MISCELLANEOUS) ×1 IMPLANT
CLSR STERI-STRIP ANTIMIC 1/2X4 (GAUZE/BANDAGES/DRESSINGS) ×1 IMPLANT
CONT SPEC STER OR (MISCELLANEOUS) ×1 IMPLANT
COVER MAYO STAND STRL (DRAPES) ×3 IMPLANT
COVER SURGICAL LIGHT HANDLE (MISCELLANEOUS) ×2 IMPLANT
DIFFUSER DRILL AIR PNEUMATIC (MISCELLANEOUS) ×2 IMPLANT
DRAIN CHANNEL 15F RND FF W/TCR (WOUND CARE) IMPLANT
DRAPE C-ARM 42X72 X-RAY (DRAPES) ×2 IMPLANT
DRAPE C-ARMOR (DRAPES) ×2 IMPLANT
DRAPE ORTHO SPLIT 77X108 STRL (DRAPES)
DRAPE POUCH INSTRU U-SHP 10X18 (DRAPES) ×2 IMPLANT
DRAPE SURG 17X23 STRL (DRAPES) ×8 IMPLANT
DRAPE SURG ORHT 6 SPLT 77X108 (DRAPES) ×1 IMPLANT
DRILL MOUNTAINEER FIXED 12MM (DRILL) ×2
DURAPREP 26ML APPLICATOR (WOUND CARE) ×3 IMPLANT
ELECT BLADE 4.0 EZ CLEAN MEGAD (MISCELLANEOUS) ×2
ELECT CAUTERY BLADE 6.4 (BLADE) ×2 IMPLANT
ELECT REM PT RETURN 9FT ADLT (ELECTROSURGICAL) ×2
ELECTRODE BLDE 4.0 EZ CLN MEGD (MISCELLANEOUS) ×1 IMPLANT
ELECTRODE REM PT RTRN 9FT ADLT (ELECTROSURGICAL) ×1 IMPLANT
EVACUATOR SILICONE 100CC (DRAIN) IMPLANT
GAUZE SPONGE 4X4 12PLY STRL (GAUZE/BANDAGES/DRESSINGS) ×2 IMPLANT
GAUZE SPONGE 4X4 16PLY XRAY LF (GAUZE/BANDAGES/DRESSINGS) ×4 IMPLANT
GLOVE BIO SURGEON STRL SZ7 (GLOVE) ×2 IMPLANT
GLOVE BIO SURGEON STRL SZ8 (GLOVE) ×2 IMPLANT
GLOVE BIOGEL PI IND STRL 7.0 (GLOVE) ×1 IMPLANT
GLOVE BIOGEL PI IND STRL 8 (GLOVE) ×1 IMPLANT
GLOVE BIOGEL PI INDICATOR 7.0 (GLOVE) ×2
GLOVE BIOGEL PI INDICATOR 8 (GLOVE) ×1
GLOVE ECLIPSE 6.5 STRL STRAW (GLOVE) ×1 IMPLANT
GOWN STRL REUS W/ TWL LRG LVL3 (GOWN DISPOSABLE) ×2 IMPLANT
GOWN STRL REUS W/ TWL XL LVL3 (GOWN DISPOSABLE) ×1 IMPLANT
GOWN STRL REUS W/TWL LRG LVL3 (GOWN DISPOSABLE) ×4
GOWN STRL REUS W/TWL XL LVL3 (GOWN DISPOSABLE) ×2
IV CATH 14GX2 1/4 (CATHETERS) ×2 IMPLANT
KIT BASIN OR (CUSTOM PROCEDURE TRAY) ×2 IMPLANT
KIT POSITION SURG JACKSON T1 (MISCELLANEOUS) ×2 IMPLANT
KIT ROOM TURNOVER OR (KITS) ×2 IMPLANT
MARKER SKIN DUAL TIP RULER LAB (MISCELLANEOUS) ×3 IMPLANT
MIX DBX 10CC 35% BONE (Bone Implant) ×1 IMPLANT
NDL HYPO 25GX1X1/2 BEV (NEEDLE) ×1 IMPLANT
NDL SAFETY ECLIPSE 18X1.5 (NEEDLE) ×1 IMPLANT
NDL SPNL 18GX3.5 QUINCKE PK (NEEDLE) ×2 IMPLANT
NEEDLE 22X1 1/2 (OR ONLY) (NEEDLE) ×2 IMPLANT
NEEDLE BONE MARROW 8GX6 FENEST (NEEDLE) IMPLANT
NEEDLE HYPO 18GX1.5 SHARP (NEEDLE) ×2
NEEDLE HYPO 25GX1X1/2 BEV (NEEDLE) ×2 IMPLANT
NEEDLE SPNL 18GX3.5 QUINCKE PK (NEEDLE) ×4 IMPLANT
NEURO MONITORING STIM (LABOR (TRAVEL & OVERTIME)) ×1 IMPLANT
NS IRRIG 1000ML POUR BTL (IV SOLUTION) ×2 IMPLANT
OIL CARTRIDGE MAESTRO DRILL (MISCELLANEOUS) ×2
PACK LAMINECTOMY ORTHO (CUSTOM PROCEDURE TRAY) ×2 IMPLANT
PACK UNIVERSAL I (CUSTOM PROCEDURE TRAY) ×2 IMPLANT
PAD ARMBOARD 7.5X6 YLW CONV (MISCELLANEOUS) ×4 IMPLANT
PATTIES SURGICAL .5 X1 (DISPOSABLE) ×2 IMPLANT
PATTIES SURGICAL .5X1.5 (GAUZE/BANDAGES/DRESSINGS) ×1 IMPLANT
ROD PRE BENT EXP 40MM (Rod) ×1 IMPLANT
ROD PRE BENT EXPEDIUM 35MM (Rod) ×1 IMPLANT
SCREW SET SINGLE INNER (Screw) ×4 IMPLANT
SCREW VIPER CORT FIX 5.00X40 (Screw) ×4 IMPLANT
SCREW VIPER CORTICAL FIX 6X45 (Screw) ×1 IMPLANT
SPONGE INTESTINAL PEANUT (DISPOSABLE) ×1 IMPLANT
SPONGE SURGIFOAM ABS GEL 100 (HEMOSTASIS) ×2 IMPLANT
STRIP CLOSURE SKIN 1/2X4 (GAUZE/BANDAGES/DRESSINGS) ×3 IMPLANT
SURGIFLO TRUKIT (HEMOSTASIS) IMPLANT
SUT MNCRL AB 4-0 PS2 18 (SUTURE) ×3 IMPLANT
SUT VIC AB 0 CT1 18XCR BRD 8 (SUTURE) ×1 IMPLANT
SUT VIC AB 0 CT1 8-18 (SUTURE) ×2
SUT VIC AB 1 CT1 18XCR BRD 8 (SUTURE) ×2 IMPLANT
SUT VIC AB 1 CT1 8-18 (SUTURE) ×2
SUT VIC AB 2-0 CT2 18 VCP726D (SUTURE) ×2 IMPLANT
SYR 20CC LL (SYRINGE) ×2 IMPLANT
SYR BULB IRRIGATION 50ML (SYRINGE) ×2 IMPLANT
SYR CONTROL 10ML LL (SYRINGE) ×4 IMPLANT
SYR TB 1ML LUER SLIP (SYRINGE) ×2 IMPLANT
TAPE CLOTH SURG 4X10 WHT LF (GAUZE/BANDAGES/DRESSINGS) ×1 IMPLANT
TOWEL OR 17X24 6PK STRL BLUE (TOWEL DISPOSABLE) ×2 IMPLANT
TOWEL OR 17X26 10 PK STRL BLUE (TOWEL DISPOSABLE) ×2 IMPLANT
TRAY FOLEY CATH 16FRSI W/METER (SET/KITS/TRAYS/PACK) ×2 IMPLANT
WATER STERILE IRR 1000ML POUR (IV SOLUTION) ×1 IMPLANT
YANKAUER SUCT BULB TIP NO VENT (SUCTIONS) ×2 IMPLANT

## 2014-05-06 NOTE — Transfer of Care (Signed)
Immediate Anesthesia Transfer of Care Note  Patient: Misty Patterson  Procedure(s) Performed: Procedure(s) with comments: POSTERIOR LUMBAR FUSION 1 LEVEL (Right) - Right sided lumbar 4-5 Transforaminal lumbar interbody fusion with instrumentation and allograft  Patient Location: PACU  Anesthesia Type:General  Level of Consciousness: awake, alert , oriented and patient cooperative  Airway & Oxygen Therapy: Patient Spontanous Breathing and Patient connected to nasal cannula oxygen  Post-op Assessment: Report given to RN, Post -op Vital signs reviewed and stable and Patient moving all extremities  Post vital signs: Reviewed and stable  Last Vitals:  Filed Vitals:   05/06/14 0630  BP: 185/67  Pulse: 67  Temp: 55.2 C    Complications: No apparent anesthesia complications

## 2014-05-06 NOTE — H&P (Signed)
PREOPERATIVE H&P  Chief Complaint: right leg pain  HPI: Misty Patterson is a 81 y.o. female who presents with ongoing pain in the right leg  MRI reveals stenosis at L4/5 and instability at L4/5  Patient has failed multiple forms of conservative care and continues to have pain (see office notes for additional details regarding the patient's full course of treatment)  Past Medical History  Diagnosis Date  . Cancer     ovarian  . Restless leg syndrome   . DVT (deep venous thrombosis)     right leg  . Peripheral neuropathy   . Dyslipidemia     but doesn't take any meds   . Ovarian cancer   . Osteoporosis     takes Vit D daily  . Incontinence of urine   . Esophageal dysmotility   . Zenker's diverticulum     small  . Decreased GFR   . Chronic kidney disease   . DJD (degenerative joint disease) of cervical spine   . Radiculopathy   . CTS (carpal tunnel syndrome)   . Anxiety     takes Valium daily  . Hypertension     takes Amlodipine and Atenolol daily  . Insomnia     takes Trazodone nightly  . Depression     takes Zoloft daily  . COPD (chronic obstructive pulmonary disease)     ProAir daily as needed  . GERD (gastroesophageal reflux disease)     takes pepcid daily  . Pneumonia     hx of-15+yrs ago  . Peripheral vascular disease     takes Plavix daily   . Chronic back pain   . Cervical disc disease   . Neuropathy   . Urinary frequency   . Nocturia    Past Surgical History  Procedure Laterality Date  . Vein surgery  1950  . Abdominal aortagram  Feb. 20, 2015  . Abdominal aortagram N/A 05/02/2013    Procedure: ABDOMINAL Maxcine Ham;  Surgeon: Elam Dutch, MD;  Location: [facility] CATH LAB;  Service: Cardiovascular;  Laterality: N/A;  . Abdominal hysterectomy    . Tonsillectomy    . Cataract surgery Bilateral   . Colonoscopy     History   Social History  . Marital Status: Widowed    Spouse Name: N/A  . Number of Children: 3  . Years of Education: N/A    Occupational History  .     Social History Main Topics  . Smoking status: Never Smoker   . Smokeless tobacco: Never Used  . Alcohol Use: No  . Drug Use: No  . Sexual Activity: Not on file   Other Topics Concern  . None   Social History Narrative   Lives with son.   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   Highest level of education:  11th grade   Family History  Problem Relation Age of Onset  . Hypertension Mother   . Lung cancer Son     Deceased, 55  . Healthy Son   . Other Father     MVA  . Hypertension Brother   . Kidney disease Sister    Allergies  Allergen Reactions  . Aspirin Other (See Comments)    REACTION:  Choking sensation, possible throat swelling  . Formaldehyde Swelling    Tongue swelling  . Sodium Pentobarbital [Pentobarbital] Other (See Comments)    Memory Loss   Prior to Admission medications  Medication Sig Start Date End Date Taking? Authorizing Provider  albuterol (PROVENTIL HFA;VENTOLIN HFA) 108 (90 BASE) MCG/ACT inhaler Inhale 2 puffs into the lungs every 6 (six) hours as needed for wheezing or shortness of breath. ProAir   Yes Historical Provider, MD  amLODipine (NORVASC) 5 MG tablet Take 5 mg by mouth daily.   Yes Historical Provider, MD  atenolol (TENORMIN) 25 MG tablet Take 12.5 mg by mouth daily.   Yes Historical Provider, MD  diazepam (VALIUM) 5 MG tablet Take 5 mg by mouth at bedtime.    Yes Historical Provider, MD  gabapentin (NEURONTIN) 300 MG capsule Take 1 capsule (300 mg total) by mouth 2 (two) times daily. Patient taking differently: Take 300 mg by mouth 3 (three) times daily.  02/20/14  Yes Donika K Patel, DO  hydrochlorothiazide (HYDRODIURIL) 25 MG tablet Take 25 mg by mouth daily.  02/24/13  Yes Historical Provider, MD  meloxicam (MOBIC) 7.5 MG tablet  03/03/14  Yes Historical Provider, MD  Omega-3 Fatty Acids (FISH OIL PO) Take 1-2 capsules by mouth daily.    Yes Historical Provider,  MD  traMADol-acetaminophen (ULTRACET) 37.5-325 MG per tablet Take 1-2 tablets by mouth 3 (three) times daily as needed (pain).  01/30/14  Yes Historical Provider, MD  b complex vitamins tablet Take 1 tablet by mouth daily.    Historical Provider, MD  Blood Glucose Monitoring Suppl (BLOOD GLUCOSE METER KIT AND SUPPLIES) KIT Dispense based on patient and insurance preference. Use up to four times daily as directed. (FOR ICD-9 250.00, 250.01). 11/21/13   Alda Berthold, DO  clopidogrel (PLAVIX) 75 MG tablet TAKE 1 TABLET BY MOUTH EVERY DAY 12/08/13   Elam Dutch, MD  Misc. Devices (ROLLATOR ULTRA-LIGHT) MISC 1 each by Does not apply route daily. 02/20/14   Donika Keith Rake, DO  nabumetone (RELAFEN) 750 MG tablet Take 750 mg by mouth daily.  01/06/14   Historical Provider, MD  oxyCODONE-acetaminophen (PERCOCET/ROXICET) 5-325 MG per tablet Take 1 tablet by mouth every 6 (six) hours as needed for severe pain. Patient not taking: Reported on 05/01/2014 03/28/14   Threasa Beards, MD  sertraline (ZOLOFT) 25 MG tablet Take 25 mg by mouth daily.  03/25/13   Historical Provider, MD  traZODone (DESYREL) 50 MG tablet Take 50 mg by mouth at bedtime.    Historical Provider, MD     All other systems have been reviewed and were otherwise negative with the exception of those mentioned in the HPI and as above.  Physical Exam: Filed Vitals:   05/06/14 0630  BP: 185/67  Pulse: 67  Temp: 97.5 F (36.4 C)    General: Alert, no acute distress Cardiovascular: No pedal edema Respiratory: No cyanosis, no use of accessory musculature Skin: No lesions in the area of chief complaint Neurologic: Sensation intact distally Psychiatric: Patient is competent for consent with normal mood and affect Lymphatic: No axillary or cervical lymphadenopathy  MUSCULOSKELETAL: + SLR on right  Assessment/Plan: Right leg pain Plan for Procedure(s): POSTERIOR LUMBAR FUSION AND DECOMPRESSION 1 LEVEL   Sinclair Ship,  MD 05/06/2014 7:47 AM

## 2014-05-06 NOTE — Progress Notes (Signed)
[facility]  Patient Status: New  [facility] is providing the following services: PT ( patient setup prior to surgery)   If patient discharges after hours, please call [phone].   Consepcion Hearing 05/06/2014, 4:16 PM

## 2014-05-06 NOTE — Anesthesia Procedure Notes (Signed)
Procedure Name: Intubation Date/Time: 05/06/2014 8:36 AM Performed by: Terrill Mohr Pre-anesthesia Checklist: Patient identified, Emergency Drugs available, Suction available and Patient being monitored Patient Re-evaluated:Patient Re-evaluated prior to inductionOxygen Delivery Method: Circle system utilized Preoxygenation: Pre-oxygenation with 100% oxygen Intubation Type: IV induction Ventilation: Mask ventilation without difficulty Laryngoscope Size: Mac and 3 Grade View: Grade II Tube type: Oral Tube size: 7.5 mm Number of attempts: 1 Airway Equipment and Method: Stylet Placement Confirmation: ETT inserted through vocal cords under direct vision,  breath sounds checked- equal and bilateral and positive ETCO2 Secured at: 21 (cm at gum) cm Tube secured with: Tape Dental Injury: Teeth and Oropharynx as per pre-operative assessment

## 2014-05-06 NOTE — Anesthesia Preprocedure Evaluation (Addendum)
Anesthesia Evaluation  Patient identified by MRN, date of birth, ID band Patient awake    Reviewed: Allergy & Precautions, NPO status , Patient's Chart, lab work & pertinent test results  Airway Mallampati: II  TM Distance: >3 FB Neck ROM: Full    Dental  (+) Edentulous Upper, Edentulous Lower   Pulmonary  breath sounds clear to auscultation        Cardiovascular hypertension, Pt. on home beta blockers Rhythm:Regular Rate:Normal     Neuro/Psych Right leg pain, right fingers numb    GI/Hepatic   Endo/Other    Renal/GU      Musculoskeletal   Abdominal   Peds  Hematology   Anesthesia Other Findings   Reproductive/Obstetrics                            Anesthesia Physical Anesthesia Plan  ASA: III  Anesthesia Plan: General   Post-op Pain Management:    Induction: Intravenous  Airway Management Planned: Oral ETT  Additional Equipment:   Intra-op Plan:   Post-operative Plan:   Informed Consent: I have reviewed the patients History and Physical, chart, labs and discussed the procedure including the risks, benefits and alternatives for the proposed anesthesia with the patient or authorized representative who has indicated his/her understanding and acceptance.     Plan Discussed with: CRNA and Anesthesiologist  Anesthesia Plan Comments: (PVD right popliteal stent placed on 05/02/2013. Has not taken plavix in over one week Hypertension GERD Plan GA with oral ETT  Roberts Gaudy)        Anesthesia Quick Evaluation

## 2014-05-06 NOTE — Progress Notes (Signed)
Orthopedic Tech Progress Note Patient Details:  IZEL HOCHBERG [birth year] [id number] Called [employer] for brace order. Patient ID: Misty Patterson, female   DOB: 02-Feb-1927, 81 y.o.   MRN: [id number]   Braulio Bosch 05/06/2014, 4:19 PM

## 2014-05-06 NOTE — Progress Notes (Signed)
Report given to robin roberts rn as caregiver 

## 2014-05-06 NOTE — Progress Notes (Signed)
Orthopedic Tech Progress Note Patient Details:  Misty Patterson 1927-01-21 [id number] Brace order completed by [employer] vendor. Patient ID: Misty Patterson, female   DOB: 05/09/1926, 81 y.o.   MRN: [id number]   Braulio Bosch 05/06/2014, 4:59 PM

## 2014-05-06 NOTE — Anesthesia Postprocedure Evaluation (Signed)
  Anesthesia Post-op Note  Patient: Misty Patterson  Procedure(s) Performed: Procedure(s) with comments: POSTERIOR LUMBAR FUSION 1 LEVEL (Right) - Right sided lumbar 4-5 Transforaminal lumbar interbody fusion with instrumentation and allograft  Patient Location: PACU  Anesthesia Type:General  Level of Consciousness: awake, alert  and oriented  Airway and Oxygen Therapy: Patient Spontanous Breathing and Patient connected to nasal cannula oxygen  Post-op Pain: mild  Post-op Assessment: Post-op Vital signs reviewed, Patient's Cardiovascular Status Stable, Patent Airway, No signs of Nausea or vomiting and Pain level controlled  Post-op Vital Signs: stable  Last Vitals:  Filed Vitals:   05/06/14 1345  BP: 166/63  Pulse: 61  Temp:   Resp: 10    Complications: No apparent anesthesia complications

## 2014-05-07 MED FILL — Heparin Sodium (Porcine) Inj 1000 Unit/ML: INTRAMUSCULAR | Qty: 30 | Status: AC

## 2014-05-07 MED FILL — Sodium Chloride IV Soln 0.9%: INTRAVENOUS | Qty: 1000 | Status: AC

## 2014-05-07 NOTE — Evaluation (Addendum)
Physical Therapy Evaluation Patient Details Name: Misty Patterson MRN: [id number] DOB: 16-Aug-1926 Today's Date: 05/07/2014   History of Present Illness  s/p L4-5 decompression & fusion; PMHx: HTN, PVD  Clinical Impression  Pt admitted with above diagnosis. Pt currently with functional limitations due to the deficits listed below (see PT Problem List).  Pt will benefit from skilled PT to increase their independence and safety with mobility to allow discharge to the venue listed below.  Pt is unclear about level of assist available at D/C, no family present at time of PT eval, she will likely need STSNF if she does not have assist at home; will follow.    Follow Up Recommendations SNF;Supervision/Assistance - 24 hour    Equipment Recommendations  None recommended by PT    Recommendations for Other Services       Precautions / Restrictions Precautions Precautions: Back Required Braces or Orthoses: Spinal Brace Spinal Brace: Thoracolumbosacral orthotic;Applied in sitting position;Applied in standing position      Mobility  Bed Mobility Overal bed mobility: Needs Assistance;+2 for physical assistance Bed Mobility: Rolling;Sidelying to Sit Rolling: Min assist;+2 for physical assistance;+2 for safety/equipment Sidelying to sit: +2 for safety/equipment;Min assist;+2 for physical assistance       General bed mobility comments: multi-modal cues for log roll technique, bed pad used to complete turn  Transfers Overall transfer level: Needs assistance Equipment used: Rolling walker (2 wheeled) Transfers: Sit to/from Stand Sit to Stand: Min assist;+2 physical assistance;+2 safety/equipment         General transfer comment: multiple attempts to stand(4); pt requiring assist for anterior-superior wt shift, to maintain correct LE position adn multi-modal cues for technique, hand placement   Ambulation/Gait Ambulation/Gait assistance: +2 physical assistance;Min assist Ambulation  Distance (Feet): 8 Feet Assistive device: Rolling walker (2 wheeled) Gait Pattern/deviations: Step-to pattern;Trunk flexed;Narrow base of support;Drifts right/left;Decreased step length - left;Decreased stance time - right        Stairs            Wheelchair Mobility    Modified Rankin (Stroke Patients Only)       Balance Overall balance assessment: Needs assistance   Sitting balance-Leahy Scale: Fair       Standing balance-Leahy Scale: Poor                               Pertinent Vitals/Pain Pain Assessment: Faces Faces Pain Scale: Hurts even more Pain Location: back and abd Pain Descriptors / Indicators: Sore Pain Intervention(s): Limited activity within patient's tolerance;Monitored during session;Repositioned    Home Living Family/patient expects to be discharged to:: Private residence Living Arrangements: Children   Type of Home: House Home Access: Level entry     Home Layout: One level Home Equipment: Environmental consultant - 2 wheels Additional Comments: pt reports her dtr "might stay with her" she cannot tell me if her dtr works or is available to assist if needed    Prior Function Level of Independence: Independent               Hand Dominance        Extremity/Trunk Assessment   Upper Extremity Assessment: Defer to OT evaluation           Lower Extremity Assessment: Generalized weakness         Communication   Communication: No difficulties  Cognition Arousal/Alertness: Awake/alert Behavior During Therapy: WFL for tasks assessed/performed Overall Cognitive Status: Within Functional Limits for tasks assessed  Area of Impairment: Problem solving             Problem Solving: Slow processing;Decreased initiation;Difficulty sequencing;Requires verbal cues;Requires tactile cues      General Comments      Exercises        Assessment/Plan    PT Assessment Patient needs continued PT services  PT Diagnosis Difficulty  walking;Generalized weakness   PT Problem List Decreased strength;Decreased activity tolerance;Decreased balance;Decreased mobility;Decreased knowledge of use of DME;Decreased safety awareness;Decreased knowledge of precautions  PT Treatment Interventions DME instruction;Gait training;Functional mobility training;Therapeutic activities;Balance training;Therapeutic exercise   PT Goals (Current goals can be found in the Care Plan section) Acute Rehab PT Goals Patient Stated Goal: less pain PT Goal Formulation: With patient Time For Goal Achievement: 05/15/14 Potential to Achieve Goals: Good    Frequency Min 5X/week   Barriers to discharge        Co-evaluation               End of Session   Activity Tolerance: Patient limited by fatigue;Patient limited by pain Patient left: in chair;with call bell/phone within reach Nurse Communication: Mobility status         Time: 1002-1026 PT Time Calculation (min) (ACUTE ONLY): 24 min   Charges:   PT Evaluation $Initial PT Evaluation Tier I: 1 Procedure PT Treatments $Gait Training: 8-22 mins   PT G Codes:        Anastazja Isaac 18-May-2014, 10:34 AM

## 2014-05-07 NOTE — Evaluation (Signed)
Occupational Therapy Evaluation Patient Details Name: Misty Patterson MRN: [id number] DOB: Jul 30, 1926 Today's Date: 05/07/2014    History of Present Illness s/p L4-5 decompression & fusion; PMHx: HTN, PVD   Clinical Impression   Pt demonstrates decline in function and safety with ADLs and ADL mobility with decreased strength, balance, endurance and cognition. Pt would benefit from acute OT services to address impairments to increase level of function and safety. Unclear from pt if she will have level of assist necessary at home with 24 hour assist and sup. Recommending short term SNF at this time. No family member present during eval    Follow Up Recommendations  SNF;Supervision/Assistance - 24 hour    Equipment Recommendations  Tub/shower bench;3 in 1 bedside comode;Other (comment) (ADL A/E)    Recommendations for Other Services       Precautions / Restrictions Precautions Precautions: Back Precaution Comments: pt unable to recall back precautions from ealrier PT session. Reviewed back precautions with pt Required Braces or Orthoses: Spinal Brace Spinal Brace: Thoracolumbosacral orthotic;Applied in sitting position;Applied in standing position Restrictions Weight Bearing Restrictions: No      Mobility Bed Mobility Overal bed mobility: Needs Assistance;+2 for physical assistance Bed Mobility: Rolling;Sidelying to Sit Rolling: Min assist;+2 for physical assistance;+2 for safety/equipment Sidelying to sit: +2 for safety/equipment;Min assist;+2 for physical assistance       General bed mobility comments: pt up in recliner  Transfers Overall transfer level: Needs assistance Equipment used: Rolling walker (2 wheeled) Transfers: Sit to/from Stand Sit to Stand: Mod assist         General transfer comment: multiple attempts to stand; pt requiring assist for anterior-superior wt shift, to maintain correct LE position adn multi-modal cues for technique, hand placement      Balance Overall balance assessment: Needs assistance Sitting-balance support: Single extremity supported;No upper extremity supported;Feet supported Sitting balance-Leahy Scale: Fair     Standing balance support: During functional activity;Bilateral upper extremity supported Standing balance-Leahy Scale: Poor                              ADL Overall ADL's : Needs assistance/impaired     Grooming: Wash/dry hands;Wash/dry face;Set up;Supervision/safety;Sitting   Upper Body Bathing: Moderate assistance;Sitting   Lower Body Bathing: Total assistance   Upper Body Dressing : Moderate assistance;Sitting   Lower Body Dressing: Total assistance   Toilet Transfer: Moderate assistance;RW;BSC;Cueing for safety;Cueing for sequencing Toilet Transfer Details (indicate cue type and reason): multiple attempts to stand; pt requiring assist for anterior-superior wt shift, to maintain correct LE position adn multi-modal cues for technique, hand placement  Toileting- Clothing Manipulation and Hygiene: Total assistance;Sit to/from stand       Functional mobility during ADLs: Moderate assistance;Cueing for sequencing;Rolling walker;Cueing for safety       Vision  wears glasses per pt report   Perception Perception Perception Tested?: No   Praxis Praxis Praxis tested?: Not tested    Pertinent Vitals/Pain Pain Assessment: Faces Pain Score: 6  Faces Pain Scale: Hurts even more Pain Location: back Pain Descriptors / Indicators: Sore Pain Intervention(s): Limited activity within patient's tolerance;Monitored during session;Repositioned     Hand Dominance Right   Extremity/Trunk Assessment Upper Extremity Assessment Upper Extremity Assessment: Generalized weakness   Lower Extremity Assessment Lower Extremity Assessment: Defer to PT evaluation       Communication Communication Communication: No difficulties   Cognition Arousal/Alertness: Awake/alert Behavior During  Therapy: WFL for tasks assessed/performed Overall Cognitive Status:  Within Functional Limits for tasks assessed Area of Impairment: Following commands;Problem solving     Memory: Decreased recall of precautions       Problem Solving: Slow processing;Decreased initiation;Difficulty sequencing;Requires verbal cues;Requires tactile cues     General Comments   pt very pleasant and cooperative                 Home Living Family/patient expects to be discharged to:: Private residence Living Arrangements: Children   Type of Home: House Home Access: Level entry     [place]: One level     Bathroom Shower/Tub: Teacher, early years/pre: [place]: Environmental consultant - 2 wheels;Shower seat   Additional Comments: pt reports her  son lives with her and that her dtr "might stay with her" she cannot tell me if her dtr works or is available to assist if needed      Prior Functioning/Environment Level of Independence: Independent             OT Diagnosis: Generalized weakness;Acute pain   OT Problem List: Decreased strength;Decreased knowledge of use of DME or AE;Decreased activity tolerance;Decreased cognition;Impaired balance (sitting and/or standing);Decreased safety awareness;Pain;Decreased knowledge of precautions   OT Treatment/Interventions: Self-care/ADL training;Therapeutic exercise;Patient/family education;Therapeutic activities;DME and/or AE instruction    OT Goals(Current goals can be found in the care plan section) Acute Rehab OT Goals Patient Stated Goal: stop hurting OT Goal Formulation: With patient Time For Goal Achievement: 05/14/14 Potential to Achieve Goals: Good ADL Goals Pt Will Perform Grooming: with min assist;standing Pt Will Perform Upper Body Bathing: with min assist;sitting Pt Will Perform Lower Body Bathing: with min assist;with adaptive equipment Pt Will Perform Upper Body Dressing: with min assist;sitting Pt Will Perform  Lower Body Dressing: with max assist;with mod assist;with adaptive equipment Pt Will Transfer to Toilet: with min assist;bedside commode  OT Frequency: Min 2X/week   Barriers to D/C: Decreased caregiver support                        End of Session Equipment Utilized During Treatment: Rolling walker;Gait belt;Other (comment) [facility]) Nurse Communication: Mobility status  Activity Tolerance: Patient limited by pain Patient left: in chair;with call bell/phone within reach;with chair alarm set   Time: 9774-1423 OT Time Calculation (min): 25 min Charges:  OT General Charges $OT Visit: 1 Procedure OT Evaluation $Initial OT Evaluation Tier I: 1 Procedure OT Treatments $Therapeutic Activity: 8-22 mins G-Codes:    Britt Bottom 05/07/2014, 2:25 PM

## 2014-05-07 NOTE — Progress Notes (Signed)
    Patient doing well Patient denies right leg pain Minimal LBP   Physical Exam: Filed Vitals:   05/07/14 0551  BP: 130/51  Pulse: 76  Temp: 100.7 F (38.2 C)  Resp: 16   Patient looks comfortable with a big smile on her face Dressing in place NVI  POD #1 s/p L4/5 decompression and fusion, doing great  - up with PT/OT, encourage ambulation - Percocet for pain, Valium for muscle spasms - likely d/c home tomorrow - brace when up OOB

## 2014-05-07 NOTE — Op Note (Signed)
NAMESYMPHANI, ECKSTROM NO.:  [id number]  MEDICAL RECORD NO.:  [id number]  LOCATION:  [id number]                        FACILITY:  [facility]  PHYSICIAN:  Phylliss Bob, MD      DATE OF BIRTH:  1926-12-01  DATE OF PROCEDURE:  05/06/2014                              OPERATIVE REPORT   PREOPERATIVE DIAGNOSIS: 1. Spinal stenosis at L4-5 affecting the lateral recess, foraminal     region, and extraforaminal region. 2. Right-sided lumbar radiculopathy.  POSTOPERATIVE DIAGNOSIS: 1. Spinal stenosis at L4-5 affecting in the lateral recess, foraminal     region, and extraforaminal region. 2. Right-sided lumbar radiculopathy.  PROCEDURES: 1. L4-5 decompression, requiring more bone removal than that of which     was required for the interbody fusion aspect of the procedure. 2. Right-sided L4-5 transforaminal lumbar interbody fusion. 3. Left-sided L4-5 posterolateral fusion. 4. Insertion of interbody device x1 (11 x 27 mm Concorde Bullet cage). 5. Placement of posterior instrumentation L4, L5. 6. Use of local autograft. 7. Use of morselized allograft. 8. Intraoperative use of fluoroscopy.  SURGEON:  Phylliss Bob, MD  ASSISTANT:  Pricilla Holm, PA-C.  ANESTHESIA:  General endotracheal anesthesia.  COMPLICATIONS:  None.  DISPOSITION:  Stable.  ESTIMATED BLOOD LOSS:  200 mL.  INDICATIONS FOR SURGERY:  Briefly, Ms. Colasurdo is a pleasant 81 year old female, who did present to me with ongoing and severe pain in her right leg.  The pain had been present for approximately 2 years.  She did get epidural injections with temporary relief.  She did, however, continue to have ongoing and severe debilitating pain.  The patient's MRI did reveal spinal stenosis at L4-5 with a prominent disc herniation at the L4-5 region affecting the lateral recess, the foraminal region, which did also extend into the extraforaminal region.  I did feel that this was causing compression of the exiting  L4 nerve and the traversing L5 nerve on the right side.  Given the patient's ongoing pain despite multiple forms of conservative care, we did discuss proceeding with the procedure noted above.  The patient did fully understand the risks and limitations of the procedure.  Of particular note, the patient did understand that given the duration of her symptoms, she may continue to have pain despite a successful decompressive procedure.  OPERATIVE DETAILS:  On May 06, 2014, the patient was brought to surgery and general endotracheal anesthesia was administered.  The patient was appropriately positioned on a Jackson spinal bed. Antibiotics were given.  The back was prepped and draped.  A midline incision was made overlying the L4-5 intervertebral space.  The lamina of L4 and L5 were subperiosteally exposed.  I then identified the entry points of the cortical screws per L4 and L5.  Using AP and lateral fluoroscopy, I did cannulate the L4 and L5 pedicles.  The taps were used, and I did tap up to the 5 mm.  Of note, on the left side at L4, given the patient's osteoporosis, I did feel that the screw was not obtaining appropriate purchase of the pedicle.  I therefore did decide to use a pedicle based trajectory at the left side on L4.  I then decorticated  the posterior elements and the posterolateral gutter on the left side.  The DBX mix was packed into the posterolateral gutter to help aid in the fusion.  A 6 x 45 mm screw was placed on the left at L4 and a 5 x 40 mm screw was placed on the left at L5.  A 40 mm rod was then placed and distraction was applied across the rod.  Caps were placed and provisionally tightened.  Of note, distraction was applied across the intervertebral space on the left.  On the right side, bone wax was placed in the cannulated pedicle screws.  A full facetectomy was performed on the right.  Of note, there was substantial compression of the traversing L5 nerve  readily noted, up on performing the facetectomy. Also of note, the traversing right L5 nerve was noted to be very erythematous, and was noted to be very much adherent to the intervertebral disc protrusion beneath it.  I did proceed with a decompression to adequately and fully decompress the spinal canal, including the right L5 nerve.  This did require more bone removal than that of which was required for the interbody fusion portion of the procedure.  Again, the traversing L5 nerve was noted to be very erythematous and inflamed.  The same was through the exiting L4 nerve on the right side.  The disc protrusion extending into the extraforaminal region and intraforaminal region was removed entirely.  In doing so, I was able to fully decompress the L4 and L5 nerves.  With an assistant holding medial retraction of the traversing L5 nerve, I did perform an annulotomy using a 15-blade knife.  I then used a series of curettes and paddle scrapers to perform a thorough and complete intervertebral diskectomy.  The endplates were appropriately prepared.  I then placed a series of intervertebral spacer trials.  I then elected to use an 11 x 27 mm intervertebral spacer.  The intervertebral space was liberally packed with autograft that was obtained from the decompression, in addition to allograft in the form of DBX mix.  Intervertebral spacer was also packed and tamped into position in the usual fashion.  I was very pleased with the press fit of the implant.  I then placed 5 x 40 mm screws on the right side.  Of note, I did use triggered EMG to test each of the screws, there was no screw that tested below 20 milliamps.  A 35 mm rod was placed and caps were placed and a final locking procedure was performed.  I then performed a final locking procedure on the left side as well.  I was very pleased with the decompression.  There was no extravasation of cerebrospinal fluid noted throughout the surgery.   All of the dural bleeding was adequately controlled at the termination of the procedure.  Of note, I did use neurologic monitoring throughout the procedure, and there was no sustained abnormal EMG activity noted throughout the surgery.  I was very pleased with the final AP and lateral fluoroscopic images.  The fascia was then closed using #1 Vicryl, the subcutaneous layer was closed using 2-0 Vicryl, and the skin was closed using 3-0 Monocryl.  Benzoin and Steri- Strips were applied followed by sterile dressing.  All instrument counts were correct at the termination of the procedure.  Of note, Pricilla Holm was my assistant throughout surgery, and did aid in retraction, suctioning, and closure.     Phylliss Bob, MD     MD/MEDQ  D:  05/06/2014  T:  05/07/2014  Job:  [id number]

## 2014-05-07 NOTE — Progress Notes (Signed)
Patient resting quietly. Patient does not seem to use PCA pump frequently.

## 2014-05-07 NOTE — Clinical Social Work Psychosocial (Signed)
Clinical Social Work Department BRIEF PSYCHOSOCIAL ASSESSMENT 05/07/2014  Patient:  Misty Patterson, Misty Patterson     Account Number:  [id number]     Admit date:  05/06/2014  Clinical Social Worker:  Delrae Sawyers  Date/Time:  05/07/2014 12:03 PM  Referred by:  Physician  Date Referred:  05/07/2014 Referred for  SNF Placement   Other Referral:   none.   Interview type:  Family Other interview type:   CSW spoke with patient's son, Misty Patterson [phone]).    PSYCHOSOCIAL DATA Living Status:  FAMILY Admitted from facility:   Level of care:   Primary support name:  Misty Patterson Primary support relationship to patient:  CHILD, ADULT Degree of support available:   Strong support system.    CURRENT CONCERNS Current Concerns  Post-Acute Placement   Other Concerns:   none.    SOCIAL WORK ASSESSMENT / PLAN CSW received referral for possible SNF placement at time of discharge. CSW attempted to meet with patient at bedside regarding discharge disposition. Patient appeared drowsy throughout assessment and unable to participate in CSW assessment.    CSW spoke with patient's son regarding discharge disposition. Patient's son reported patient's son lives with patient and assist with patient's care. Patient's son agreeable to SNF placement for patient at discharge, but expressed concern of patient's insurance covering SNF cost. CSW educated patient's son on insurance authorization process for SNF placement.    CSW to continue to follow and assist with discharge planning.   Assessment/plan status:  Psychosocial Support/Ongoing Assessment of Needs Other assessment/ plan:   none.   Information/referral to community resources:   [facility] bed offers.  FL2 on chart.    PATIENT'S/FAMILY'S RESPONSE TO PLAN OF CARE: Patient's son understanding and agreeable to CSW plan of care. Patient's son expressed no further questions or concerns at this time.       Lubertha Sayres, [place]  [phone]) Licensed Clinical Social Worker Orthopedics [phone]) and Surgical [phone])

## 2014-05-07 NOTE — Clinical Social Work Placement (Addendum)
Clinical Social Work Department CLINICAL SOCIAL WORK PLACEMENT NOTE 05/07/2014  Patient:  Misty Patterson, Misty Patterson  Account Number:  [id number] Admit date:  05/06/2014  Clinical Social Worker:  Delrae Sawyers  Date/time:  05/07/2014 02:45 PM  Clinical Social Work is seeking post-discharge placement for this patient at the following level of care:   [place]   (*CSW will update this form in Epic as items are completed)   05/07/2014  Patient/family provided with [facility] Department of Clinical Social Work's list of facilities offering this level of care within the geographic area requested by the patient (or if unable, by the patient's family).  05/07/2014  Patient/family informed of their freedom to choose among providers that offer the needed level of care, that participate in Medicare, Medicaid or managed care program needed by the patient, have an available bed and are willing to accept the patient.  05/07/2014  Patient/family informed of [facility]' ownership interest in [facility], as well as of the fact that they are under no obligation to receive care at this facility.  PASARR submitted to EDS on 05/07/2014 PASARR number received on 05/07/2014  FL2 transmitted to all facilities in geographic area requested by pt/family on  05/07/2014 FL2 transmitted to all facilities within larger geographic area on   Patient informed that his/her managed care company has contracts with or will negotiate with  certain facilities, including the following:     Patient/family informed of bed offers received:   Patient chooses bed at  Physician recommends and patient chooses bed at    Patient to be transferred to [facility]  on  05/09/2014 Patient to be transferred to facility by [facility], Register Patient and family notified of transfer on 05/09/2014 Name of family member notified:  Rush Farmer  The following physician request  were entered in Epic:   Additional Comments:   [place] [phone]) Licensed Clinical Social Worker Orthopedics [phone]) and Surgical [phone])

## 2014-05-07 NOTE — Progress Notes (Signed)
UR review completed. Whitman Hero RN ,CM

## 2014-05-07 NOTE — Progress Notes (Signed)
Patient without void since d/c of cath this am.  Bladder scan performed, only showed 82mL.  Fluids encouraged.  Patient without complaints of needing to void.  Bladder scan performed again later in shift, showed 32mL.  I+O cath performed per order, 224mL of urine expressed.  Fluids continue to be encouraged.

## 2014-05-08 MED ORDER — HYDROCODONE-ACETAMINOPHEN 5-325 MG PO TABS
1.0000 | ORAL_TABLET | ORAL | Status: DC | PRN
  Administered 2014-05-08 (×2): 1 via ORAL
  Administered 2014-05-09: 2 via ORAL
  Administered 2014-05-09: 1 via ORAL
  Filled 2014-05-08: qty 1
  Filled 2014-05-08: qty 2
  Filled 2014-05-08 (×2): qty 1

## 2014-05-08 NOTE — Progress Notes (Signed)
    Patient doing well, resolved R leg pain, expected PO LBP and stiffness, has been up to chair and ambulating short distances. Some difficult voiding, was I/O cath yesterday, reports urinating on own this morning. Good appetite.     Physical Exam: BP 121/45 mmHg  Pulse 70  Temp(Src) 97.6 F (36.4 C) (Oral)  Resp 14  Ht 5' 5.5" (1.664 m)  Wt 62.596 kg (138 lb)  BMI 22.61 kg/m2  SpO2 97%  Dressing in place, laying comfortable in bed, SCD's in place NVI  POD #2 s/p L4-5 Decompression and Fusion, doing well  - Pt has minimal help at home and slow gains in PT/OT with senile episodes, SNF is being rec  -Order placed for social work consult for SNF - OK to D/C to SNF when available  - up with PT/OT, encourage ambulation - Norco for pain, Valium for muscle spasms  -Percocet was too strong and changed to Norco - likely d/c SNF today vs tomorrow

## 2014-05-08 NOTE — Progress Notes (Signed)
Pt unable to void. Bladder scanned previously during the night and only viewed 65 cc. Pt bladder scanned again @ 0400, viewed 119. I/O cath performed, received 275 via straight cath. RN encouraged fluids and will continue to monitor.

## 2014-05-08 NOTE — Progress Notes (Signed)
Physical Therapy Treatment Patient Details Name: Misty Patterson MRN: [id number] DOB: 09-Jul-1926 Today's Date: 05/08/2014    History of Present Illness s/p L4-5 decompression & fusion; PMHx: HTN, PVD    PT Comments    Patient limited today by pain.  Patient also more impulsive today.  Agree with need for SNF at discharge.  Follow Up Recommendations  SNF;Supervision/Assistance - 24 hour     Equipment Recommendations  None recommended by PT    Recommendations for Other Services       Precautions / Restrictions Precautions Precautions: Back Precaution Comments: Patient unable to state back precautions.  Reviewed with patient Required Braces or Orthoses: Spinal Brace Spinal Brace: Thoracolumbosacral orthotic;Applied in sitting position;Applied in standing position Restrictions Weight Bearing Restrictions: No    Mobility  Bed Mobility Overal bed mobility: Needs Assistance Bed Mobility: Rolling;Sidelying to Sit Rolling: Min assist Sidelying to sit: Mod assist       General bed mobility comments: Verbal cues for log roll technique.  Assist to raise trunk to sitting position.  Once upright, patient able to maintain sitting balance.  Required total assist to don back brace.  Transfers Overall transfer level: Needs assistance Equipment used: Rolling walker (2 wheeled) Transfers: Sit to/from [facility] Sit to Stand: Mod assist;+2 physical assistance Stand pivot transfers: Mod assist;+2 physical assistance       General transfer comment: Verbal cues for hand placement and technique.  Patient stood x3 with +2 mod assist to power up to standing.  Stood for several seconds, lifted Rt leg complaining of pain, and immediately "flopped" back onto bed each time.  Cues to control descent onto bed.  On fourth attempt, instructed patient to pivot to chair.  Patient able to take several steps to pivot to chair.  Assist to control descent.  Ambulation/Gait             General Gait Details: Unable due to pain back and RLE   Stairs            Wheelchair Mobility    Modified Rankin (Stroke Patients Only)       Balance                                    Cognition Arousal/Alertness: Awake/alert Behavior During Therapy: Impulsive;Restless Overall Cognitive Status: No family/caregiver present to determine baseline cognitive functioning Area of Impairment: Attention;Memory;Following commands;Safety/judgement;Problem solving   Current Attention Level: Sustained Memory: Decreased recall of precautions Following Commands: Follows one step commands inconsistently;Follows one step commands with increased time Safety/Judgement: Decreased awareness of safety   Problem Solving: Slow processing;Difficulty sequencing;Requires verbal cues General Comments: Patient very impulsive today, with decreased safety awareness.    Exercises      General Comments        Pertinent Vitals/Pain Pain Assessment: Faces Faces Pain Scale: Hurts even more Pain Location: Back and Right leg Pain Descriptors / Indicators: Aching;Sore Pain Intervention(s): Limited activity within patient's tolerance;Repositioned    Home Living                      Prior Function            PT Goals (current goals can now be found in the care plan section) Progress towards PT goals: Progressing toward goals    Frequency  Min 5X/week    PT Plan Current plan remains appropriate    Co-evaluation  End of Session Equipment Utilized During Treatment: Gait belt;Back brace Activity Tolerance: Patient limited by pain Patient left: in chair;with call bell/phone within reach;with chair alarm set     Time: 4492-0100 PT Time Calculation (min) (ACUTE ONLY): 25 min  Charges:  $Therapeutic Activity: 23-37 mins                    G Codes:      Despina Pole May 12, 2014, 3:07 PM Carita Pian. Sanjuana Kava, [place] Pager  [phone]

## 2014-05-09 NOTE — Progress Notes (Addendum)
    Patient doing well, c/o some B leg pain, expected PO LBP and stiffness, sitting up in bed, about to eat. PT recommends SNF. Voiding now without difficulty.   Physical Exam: BP 127/39 mmHg  Pulse 73  Temp(Src) 99 F (37.2 C) (Oral)  Resp 16  Ht 5' 5.5" (1.664 m)  Wt 62.596 kg (138 lb)  BMI 22.61 kg/m2  SpO2 94%  Dressing in place, laying comfortable in bed, SCD's presently off NVI  POD #3 s/p L4-5 Decompression and Fusion, doing well  - Pt has minimal help at home and slow gains in PT/OT with senile episodes, SNF is being rec  -Order placed for social work consult for SNF - OK to D/C to SNF when available--FL-2 signed - up with PT/OT, encourage ambulation - Norco for pain, Valium for muscle spasms  -Percocet was too strong and changed to [employer]

## 2014-05-09 NOTE — Clinical Social Work Note (Signed)
CSW made aware patient ready for d/c to [facility]. CSW contacted [facility] weekend coordinator (Amy). Per Amy, patient is responsible for $40 copayment for first 20 days and $160 copayment on day 21. Amy states insurance is contracted with the following facilities: [facility], [facility], and [facility]. CSW contacted patient's son Misty Patterson) [phone] and introduced self. CSW provided son with information and facilities who contract with insurance in [place] and [place] educated son on limited availability of weekend admissions for facilities provided. CSW provided son with available SNF in [facility]. Son is agreeable to [place]. CSW contacted [facility] and left message for weekend coordinator. CSW to continue to follow.   [place], [place] Weekend Clinical Social Worker [phone]

## 2014-05-09 NOTE — Discharge Summary (Addendum)
Physician Discharge Summary  Patient ID: Misty Patterson MRN: [id number] DOB/AGE: 1926-12-08 81 y.o.  Admit date: 05/06/2014 Discharge date: 05/09/2014  Admission Diagnoses:  Spinal stenosis with radiculopathy  Discharge Diagnoses:  Active Problems:   Radiculopathy   Past Medical History  Diagnosis Date  . Cancer     ovarian  . Restless leg syndrome   . DVT (deep venous thrombosis)     right leg  . Peripheral neuropathy   . Dyslipidemia     but doesn't take any meds   . Ovarian cancer   . Osteoporosis     takes Vit D daily  . Incontinence of urine   . Esophageal dysmotility   . Zenker's diverticulum     small  . Decreased GFR   . Chronic kidney disease   . DJD (degenerative joint disease) of cervical spine   . Radiculopathy   . CTS (carpal tunnel syndrome)   . Anxiety     takes Valium daily  . Hypertension     takes Amlodipine and Atenolol daily  . Insomnia     takes Trazodone nightly  . Depression     takes Zoloft daily  . COPD (chronic obstructive pulmonary disease)     ProAir daily as needed  . GERD (gastroesophageal reflux disease)     takes pepcid daily  . Pneumonia     hx of-15+yrs ago  . Peripheral vascular disease     takes Plavix daily   . Chronic back pain   . Cervical disc disease   . Neuropathy   . Urinary frequency   . Nocturia     Surgeries: Procedure(s): POSTERIOR LUMBAR FUSION 1 LEVEL on 05/06/2014   Consultants (if any):    Discharged Condition: Improved  Hospital Course: Misty Patterson is an 81 y.o. female who was admitted 05/06/2014 with a diagnosis of spinal stenosis with radiculopathy and went to the operating room on 05/06/2014 and underwent the above named procedures.    She was given perioperative antibiotics:  Anti-infectives    Start     Dose/Rate Route Frequency Ordered Stop   05/06/14 1700  ceFAZolin (ANCEF) IVPB 1 g/50 mL premix     1 g 100 mL/hr over 30 Minutes Intravenous Every 8 hours 05/06/14 1526 05/07/14 0141    05/06/14 0600  ceFAZolin (ANCEF) IVPB 2 g/50 mL premix     2 g 100 mL/hr over 30 Minutes Intravenous On call to O.R. 05/05/14 1349 05/06/14 0840    .  She was given sequential compression devices & early ambulation for VTE prophylaxis.  She benefited maximally from the hospital stay and there were no complications.  At the time of discharge, she was tolerating an oral diet and voiding without difficulties.  Recent vital signs:  Filed Vitals:   05/09/14 0910  BP: 137/60  Pulse: 73  Temp:   Resp:     Recent laboratory studies:  Lab Results  Component Value Date   HGB 14.2 05/01/2014   HGB 13.7 05/02/2013   HGB 15.6* 05/02/2013   Lab Results  Component Value Date   WBC 5.3 05/01/2014   PLT 205 05/01/2014   Lab Results  Component Value Date   INR 1.03 05/01/2014   Lab Results  Component Value Date   NA 136 05/01/2014   K 3.4* 05/01/2014   CL 100 05/01/2014   CO2 27 05/01/2014   BUN 16 05/01/2014   CREATININE 1.23* 05/01/2014   GLUCOSE 111* 05/01/2014    Discharge Medications:  Medication List    STOP taking these medications        clopidogrel 75 MG tablet  Commonly known as:  PLAVIX     nabumetone 750 MG tablet  Commonly known as:  RELAFEN      TAKE these medications        albuterol 108 (90 BASE) MCG/ACT inhaler  Commonly known as:  PROVENTIL HFA;VENTOLIN HFA  Inhale 2 puffs into the lungs every 6 (six) hours as needed for wheezing or shortness of breath. ProAir     amLODipine 5 MG tablet  Commonly known as:  NORVASC  Take 5 mg by mouth daily.     atenolol 25 MG tablet  Commonly known as:  TENORMIN  Take 12.5 mg by mouth daily.     b complex vitamins tablet  Take 1 tablet by mouth daily.     blood glucose meter kit and supplies Kit  Dispense based on patient and insurance preference. Use up to four times daily as directed. (FOR ICD-9 250.00, 250.01).     diazepam 5 MG tablet  Commonly known as:  VALIUM  Take 5 mg by mouth at bedtime.      gabapentin 300 MG capsule  Commonly known as:  NEURONTIN  Take 1 capsule (300 mg total) by mouth 2 (two) times daily.     hydrochlorothiazide 25 MG tablet  Commonly known as:  HYDRODIURIL  Take 25 mg by mouth daily.     oxyCODONE-acetaminophen 5-325 MG per tablet  Commonly known as:  PERCOCET/ROXICET  Take 1 tablet by mouth every 6 (six) hours as needed for severe pain.     ROLLATOR ULTRA-LIGHT Misc  1 each by Does not apply route daily.     sertraline 25 MG tablet  Commonly known as:  ZOLOFT  Take 25 mg by mouth daily.     traZODone 50 MG tablet  Commonly known as:  DESYREL  Take 50 mg by mouth at bedtime.        Diagnostic Studies: Dg Chest 2 View  05/01/2014   CLINICAL DATA:  80 year old female preoperative evaluation for lumbar fusion.  EXAM: CHEST - 2 VIEW  COMPARISON:  12/12/2011, 04/20/2011, 12/30/2009  FINDINGS: Cardiomediastinal silhouette unchanged in size and contour. Atherosclerotic calcifications of the aortic arch.  No pulmonary vascular congestion.  No confluent airspace disease. Ill-defined linear opacities in the upper right lung and lower bilateral lungs unchanged over the course of several examinations. No pleural effusion or pneumothorax.  No displaced fracture.  Compression fracture of mid thoracic vertebral body, not present on the comparison plain film dated 12/12/2011.  Degenerative changes of the bilateral shoulders.  IMPRESSION: No radiographic evidence of acute cardiopulmonary disease, with chronic lung changes/scarring.  Interval development of mid thoracic compression fracture from the comparison plain film dated 12/12/2011. This is strictly age indeterminate with no retropulsion of fracture fragments. Correlation with history of trauma and focal back pain may be useful.  Signed,  Dulcy Fanny. Earleen Newport, DO  [facility]  [facility]   Electronically Signed   By: Corrie Mckusick D.O.   On: 05/01/2014 18:03   Dg  Lumbar Spine 2-3 Views  05/06/2014   CLINICAL DATA:  Right sided lumbar 4-5 Transforaminal lumbar interbody fusion with instrumentation and allograft, fluoro time 46 seconds  EXAM: DG C-ARM 61-120 MIN; LUMBAR SPINE - 2-3 VIEW  TECHNIQUE: AP and lateral spot fluoro graphic images following posterior lumbar spine fusion.  FINDINGS: Bilateral pedicle screws and interconnecting rods  fuse L4-L5. There is a radiolucent disc spacer maintaining disc height. There is no acute fracture or evidence of an operative complication. Orthopedic hardware is well-seated and aligned.  IMPRESSION: Postop L4-L5 fusion images. Fusion hardware well-seated and aligned. No operative complication.   Electronically Signed   By: Lajean Manes M.D.   On: 05/06/2014 14:47   Dg Lumbar Spine 2-3 Views  05/06/2014   CLINICAL DATA:  Lateral portable lumbar spine imaging for surgical localization.  EXAM: LUMBAR SPINE - 2-3 VIEW  COMPARISON:  Lumbar MRI, 02/19/2014  FINDINGS: There are 2 surgical needles which have been inserted posteriorly. The more superior of these lies posterior to the L3-L4 disc, superimposed along the posterior margin of the L3 spinous process. More inferior lies posterior to the L4-L5 disc just posterior to the L4 spinous process.  IMPRESSION: Surgical localization imaging as described.   Electronically Signed   By: Lajean Manes M.D.   On: 05/06/2014 10:20   Dg C-arm 1-60 Min  05/06/2014   CLINICAL DATA:  Right sided lumbar 4-5 Transforaminal lumbar interbody fusion with instrumentation and allograft, fluoro time 46 seconds  EXAM: DG C-ARM 61-120 MIN; LUMBAR SPINE - 2-3 VIEW  TECHNIQUE: AP and lateral spot fluoro graphic images following posterior lumbar spine fusion.  FINDINGS: Bilateral pedicle screws and interconnecting rods fuse L4-L5. There is a radiolucent disc spacer maintaining disc height. There is no acute fracture or evidence of an operative complication. Orthopedic hardware is well-seated and aligned.   IMPRESSION: Postop L4-L5 fusion images. Fusion hardware well-seated and aligned. No operative complication.   Electronically Signed   By: Lajean Manes M.D.   On: 05/06/2014 14:47    Disposition: SNF        Follow-up Information    Follow up with Sinclair Ship, MD.   Specialty:  Orthopedic Surgery   Contact information:   [place] [id number] [phone]        Signed: Grandville Silos, Dulce Martian A. 05/09/2014, 11:00 AM

## 2014-05-09 NOTE — Progress Notes (Signed)
Spoke with Dr. Grandville Silos, who said that it is okay to DC without anticoagulation medicine.

## 2014-05-09 NOTE — Clinical Social Work Note (Signed)
CSW received phone call from patient's daughter Orene Desanctis [phone]). Pam spoke with CSW via deaf interpreter. CSW explained SNF placement process and d/c plan. Patient's daughter agreeable with [facility] and requests CSW make her aware of patient's d/c to facility and transportation via [place]. CSW spoke with Sharyn Lull at [facility] who confirmed private bed availability for patient. CSW met with patient and son who was present at bedside and provided information from facility. Patient was excited and stated, "Thank the Lord I am in such a position to get a private room. I am so glad to be going and get what I need because after the fall, I knew I needed some help so I can get back to my life." CSW acknowledged patient's self-determination for independence.CSW contacted patient's daughter and left her a message concerning estimated EMS arrival time. CSW prepared d/c packet and placed in patient's shadow chart. CSW faxed d/c summary to facility and provided RN Joy with number and room for report. No further needs. CSW to arrange transportation via [place]. CSW signing off.  George, Edith Endave Weekend Clinical Social Worker [phone]

## 2014-05-09 NOTE — Progress Notes (Signed)
Physical Therapy Treatment Patient Details Name: Misty Patterson MRN: [id number] DOB: 08/11/1926 Today's Date: 05/09/2014    History of Present Illness s/p L4-5 decompression & fusion; PMHx: HTN, PVD    PT Comments    Patient able to ambulate today with encouragement.  Agree with need for SNF at discharge.  Follow Up Recommendations  SNF;Supervision/Assistance - 24 hour     Equipment Recommendations  None recommended by PT    Recommendations for Other Services       Precautions / Restrictions Precautions Precautions: Back;Fall Precaution Comments: Unable to recall back precautions.  Reviewed with patient. Required Braces or Orthoses: Spinal Brace Spinal Brace: Thoracolumbosacral orthotic;Applied in sitting position;Applied in standing position Restrictions Weight Bearing Restrictions: No    Mobility  Bed Mobility Overal bed mobility: Needs Assistance Bed Mobility: Rolling;Sidelying to Sit Rolling: Min guard Sidelying to sit: Min assist       General bed mobility comments: Verbal cues for log roll technique.  Assist to raise trunk to sitting position.  Once upright, patient able to maintain sitting balance.  Required total assist to don back brace.  Transfers Overall transfer level: Needs assistance Equipment used: Rolling walker (2 wheeled) Transfers: Sit to/from Stand Sit to Stand: Min assist;+2 physical assistance         General transfer comment: Verbal cues for hand placement.  Upon standing, patient lifts RLE off of ground, c/o pain.  Patient begins to sit back on bed.  Encouraged to remain standing.  Ambulation/Gait Ambulation/Gait assistance: Min assist;+2 physical assistance Ambulation Distance (Feet): 12 Feet Assistive device: Rolling walker (2 wheeled) Gait Pattern/deviations: Step-through pattern;Decreased step length - right;Decreased step length - left;Decreased weight shift to right;Shuffle Gait velocity: Decreased Gait velocity interpretation:  Below normal speed for age/gender General Gait Details: Patient able to ambulate today with RW with encouragement.  Assist for safety and balance.   Stairs            Wheelchair Mobility    Modified Rankin (Stroke Patients Only)       Balance                                    Cognition Arousal/Alertness: Awake/alert Behavior During Therapy: Impulsive;Restless Overall Cognitive Status: No family/caregiver present to determine baseline cognitive functioning Area of Impairment: Attention;Memory;Following commands;Safety/judgement;Problem solving   Current Attention Level: Sustained Memory: Decreased recall of precautions Following Commands: Follows one step commands inconsistently;Follows one step commands with increased time Safety/Judgement: Decreased awareness of safety   Problem Solving: Slow processing;Difficulty sequencing;Requires verbal cues General Comments: Continues to be impulsive with decreased safety awareness.      Exercises      General Comments        Pertinent Vitals/Pain Pain Assessment: Faces Faces Pain Scale: Hurts even more Pain Location: Rt LE and buttocks (with standing) Pain Descriptors / Indicators: Aching;Sore Pain Intervention(s): Limited activity within patient's tolerance;Repositioned    Home Living                      Prior Function            PT Goals (current goals can now be found in the care plan section) Progress towards PT goals: Progressing toward goals    Frequency  Min 5X/week    PT Plan Current plan remains appropriate    Co-evaluation  End of Session Equipment Utilized During Treatment: Gait belt;Back brace Activity Tolerance: Patient limited by pain Patient left: in chair;with call bell/phone within reach;with chair alarm set     Time: 3825-0539 PT Time Calculation (min) (ACUTE ONLY): 14 min  Charges:  $Gait Training: 8-22 mins                    G Codes:       Despina Pole 16-May-2014, 6:24 PM Carita Pian. Sanjuana Kava, [place] Pager [phone]

## 2014-05-09 NOTE — Discharge Instructions (Signed)
See printed instructions in chart.

## 2014-05-09 NOTE — Progress Notes (Addendum)
Gave report to Dorado, nurse at [place], [place]. All questions answered and callback number given. Patient ready for discharge.

## 2014-05-09 NOTE — Progress Notes (Signed)
Left a message with [facility] answering service regarding patient not having any anti-coagulant medicine ordered for discharge.

## 2014-05-27 ENCOUNTER — Encounter: Payer: Self-pay | Admitting: Family

## 2014-05-28 ENCOUNTER — Other Ambulatory Visit (HOSPITAL_COMMUNITY): Payer: Medicare Other

## 2014-05-28 ENCOUNTER — Ambulatory Visit: Payer: Medicare Other | Admitting: Family

## 2014-05-28 ENCOUNTER — Encounter (HOSPITAL_COMMUNITY): Payer: Medicare Other

## 2014-06-22 ENCOUNTER — Ambulatory Visit: Payer: Medicare Other | Admitting: Neurology

## 2014-06-22 DIAGNOSIS — Z029 Encounter for administrative examinations, unspecified: Secondary | ICD-10-CM

## 2014-06-24 ENCOUNTER — Encounter: Payer: Self-pay | Admitting: Neurology

## 2014-07-02 ENCOUNTER — Encounter: Payer: Self-pay | Admitting: Neurology

## 2014-07-02 ENCOUNTER — Ambulatory Visit (INDEPENDENT_AMBULATORY_CARE_PROVIDER_SITE_OTHER): Payer: PPO | Admitting: Neurology

## 2014-07-02 VITALS — BP 120/70 | HR 74 | Ht 65.0 in | Wt 146.6 lb

## 2014-07-02 DIAGNOSIS — M5412 Radiculopathy, cervical region: Secondary | ICD-10-CM | POA: Diagnosis not present

## 2014-07-02 DIAGNOSIS — R269 Unspecified abnormalities of gait and mobility: Secondary | ICD-10-CM

## 2014-07-02 DIAGNOSIS — F0390 Unspecified dementia without behavioral disturbance: Secondary | ICD-10-CM

## 2014-07-02 DIAGNOSIS — M5417 Radiculopathy, lumbosacral region: Secondary | ICD-10-CM

## 2014-07-02 MED ORDER — DONEPEZIL HCL 5 MG PO TABS: ORAL_TABLET | ORAL | Status: DC

## 2014-07-02 NOTE — Progress Notes (Signed)
Note sent

## 2014-07-02 NOTE — Progress Notes (Signed)
Follow-up Visit   Date: 07/02/2014    Misty Patterson MRN: [id number] DOB: Sep 14, 1926   Interim History: Misty Patterson is a 81 y.o. right-handed Caucasian female with history of peripheral vascular disease s/p R popliteal stent placement (05/02/2013), depression, GERD, hypertension, ovarian cancer, CKD returning to the clinic for follow-up of generalized malaise and paresthesias of her hands.   History of present illness: In 2012, she suffered a fall while shopping in [place] where her leg got caught by plastic covering off the racks. Since then she has neck pain, bilateral arm achy pain, numbness/tingling of the hands. She has seen a number of physicians since then including orthopedic surgery. She completed physical therapy which helped her arm numbness transiently. She has also underwent electrodiagnostic testing in November 2014 by Dr. Jannifer Franklin at [facility] which showed bilateral carpal tunnel syndrome, possible overlying peripheral neuropathy based on prolongation of sensory latency, and C7-8 radiculopathy on the left side. She denies any alleviating or exacerbating factors for her paresthesias are arm pain. She also complains of right leg foot twitching only occurring at nighttime. Movements are pain does and does not have any associated crawling sensation. She denies the urge to move her legs.   She is a very active individual up until recently when her episodic weakness has limited what she is able to do. Previously, she was cutting her own wood and taking care of her garden.   UPDATE 11/21/2013:  There has been no interval change in her symptoms and she continues to tired easily with continued arm pain and paresthesias. She is taking gabapentin 200 mg at bedtime without any significant relief. MRI of the cervical spine showed chronic spinal stenosis with mass effect and myelomalacia at C4-C5-C5-C6. There is severe by foraminal stenosis at these levels. She also has advanced degenerative changes  and severe C8 by foraminal stenosis. Overall, imaging appears stable from 2014.   UPDATE 02/20/2014:  She has on new symptoms and no change in her bilateral hand numbness and weakness.  She was at the grocery store and when she bought milk home, the gallon slipped out of her hand and hit the floor.  She did get a Life Alert System and is wearing it.  She continues to have gait difficulty from low back pain and her neuropathy.   UPDATE 07/02/2014:  She underwent lumbar fusion and decompression at L4-5 and reports having improved low back pain.  Her gait remains unsteady and she uses her walker at home.  Fortunately, she has not had any falls.  She endorses getting confused and missed her DEXA appointment this week. She also missed her last follow-up visit with [place].  She does endorse difficulty with her memory and getting lost at times. Concentration and attention also seems to be impaired. She she did drive to her appointment today, even though she does feel uncomfortable at time on the road. She has not been involved in any motor vehicle accidents.    Medications:  Current Outpatient Prescriptions on File Prior to Visit  Medication Sig Dispense Refill  . albuterol (PROVENTIL HFA;VENTOLIN HFA) 108 (90 BASE) MCG/ACT inhaler Inhale 2 puffs into the lungs every 6 (six) hours as needed for wheezing or shortness of breath. ProAir    . amLODipine (NORVASC) 5 MG tablet Take 5 mg by mouth daily.    Marland Kitchen atenolol (TENORMIN) 25 MG tablet Take 12.5 mg by mouth daily.    Marland Kitchen b complex vitamins tablet Take 1 tablet by mouth daily.    Marland Kitchen  Blood Glucose Monitoring Suppl (BLOOD GLUCOSE METER KIT AND SUPPLIES) KIT Dispense based on patient and insurance preference. Use up to four times daily as directed. (FOR ICD-9 250.00, 250.01). 1 each 3  . diazepam (VALIUM) 5 MG tablet Take 5 mg by mouth at bedtime.     . gabapentin (NEURONTIN) 300 MG capsule Take 1 capsule (300 mg total) by mouth 2 (two) times daily. (Patient taking  differently: Take 300 mg by mouth 3 (three) times daily. ) 60 capsule 5  . hydrochlorothiazide (HYDRODIURIL) 25 MG tablet Take 25 mg by mouth daily.     . Misc. Devices (ROLLATOR ULTRA-LIGHT) MISC 1 each by Does not apply route daily. 1 each 0  . oxyCODONE-acetaminophen (PERCOCET/ROXICET) 5-325 MG per tablet Take 1 tablet by mouth every 6 (six) hours as needed for severe pain. (Patient not taking: Reported on 05/01/2014) 20 tablet 0  . sertraline (ZOLOFT) 25 MG tablet Take 25 mg by mouth daily.     . traZODone (DESYREL) 50 MG tablet Take 50 mg by mouth at bedtime.     No current facility-administered medications on file prior to visit.    Allergies:  Allergies  Allergen Reactions  . Aspirin Other (See Comments)    REACTION:  Choking sensation, possible throat swelling  . Formaldehyde Swelling    Tongue swelling  . Sodium Pentobarbital [Pentobarbital] Other (See Comments)    Memory Loss     Review of Systems:  CONSTITUTIONAL: No fevers, chills, night sweats, or weight loss.   EYES: No visual changes or eye pain ENT: No hearing changes.  No history of nose bleeds.   RESPIRATORY: No cough, wheezing and shortness of breath.   CARDIOVASCULAR: Negative for chest pain, and palpitations.   GI: Negative for abdominal discomfort, blood in stools or black stools.  No recent change in bowel habits.   GU:  No history of incontinence.   MUSCLOSKELETAL: + history of joint pain or swelling.  No myalgias.   SKIN: Negative for lesions, rash, and itching.   ENDOCRINE: Negative for cold or heat intolerance, polydipsia or goiter.   PSYCH:  No depression or anxiety symptoms.   NEURO: As Above.   Vital Signs:  BP 120/70 mmHg  Pulse 74  Ht '5\' 5"'  (1.651 m)  Wt 146 lb 9 oz (66.48 kg)  BMI 24.39 kg/m2  SpO2 98%  Neurological Exam: MENTAL STATUS:  She is oriented to year, month, and day only.  She does not watch the news.  Cannot name the president "colored man". Speech is not  dysarthric.  Montreal Cognitive Assessment  07/02/2014  Visuospatial/ Executive (0/5) 1  Naming (0/3) 2  Attention: Read list of digits (0/2) 2  Attention: Read list of letters (0/1) 0  Attention: Serial 7 subtraction starting at 100 (0/3) 0  Language: Repeat phrase (0/2) 2  Language : Fluency (0/1) 0  Abstraction (0/2) 0  Delayed Recall (0/5) 2  Orientation (0/6) 5  Total 14  Adjusted Score (based on education) 15     CRANIAL NERVES: Face is symmetric.  MOTOR:  Motor strength is 5/5 in all extremities.  No pronator drift.  Tone is normal.    MSRs:  Reflexes are 2+/4 in the upper extremities and absent in the lower extremities.   SENSORY: Absent vibration and pin prick distal to knees bilaterally.Sensation in the upper extremities is intact.  COORDINATION/GAIT: Gait is moderately wide-based, but stable. Unassisted gait.   Data: CT head wo contrast 12/12/2011: Moderate small vessel ischemic change  and atrophy. No acute intracranial abnormality.   EMG 01/28/2013:  Nerve conduction studies done on both upper extremities shows evidence of a mild right carpal tunnel syndrome, and a borderline left carpal tunnel syndrome. There is diffuse prolongation of the sensory latencies in the upper extremities, and the possibility of an overlying peripheral neuropathy should be considered. The patient does report some numbness and burning sensations in the feet. EMG evaluation of the right upper extremity shows some mild distal chronic denervation consistent with a peripheral neuropathy, but no evidence of an overlying cervical radiculopathy. EMG evaluation of the left upper extremity shows findings consistent with a chronic stable C8 and C7 radiculopathy.   MRI cervical spine 10/21/2013: 1. No acute findings in the cervical spine.  2. Advanced degeneration of C5-C6 and C6-C7 in the setting of C3 through C5 posterior element ankylosis (congenital or degenerative). Chronic spinal stenosis with  spinal cord mass effect and abnormal cord signal (myelomalacia) at the C4-C5 through C5-C6 levels. Associated severe biforaminal stenosis at these levels. The appearance has not significantly changed since 2014.  3. Advanced degenerative changes at the anterior C1-C2 articulation and at C2-C3 are stable, with foraminal stenosis but no spinal stenosis.  4. Chronic mild anterolisthesis at C7-T1 with stable degenerative changes resulting in severe C8 biforaminal stenosis.  Labs 10/10/2013:  Mg 1.9, vitamin B12 682, copper 99, Cr 1.22*, TSH 4.7*  EMG upper extremities 12/01/2013: 1. Right median neuropathy, at or distal to the wrist consistent with the clinical diagnosis of carpal tunnel syndrome. Overall, these findings are mild to moderate in degree electrically. 2. 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 3. Chronic C7 radiculopathy affecting bilateral upper extremities, moderate in degree electrically.  4. Chronic C6 radiculopathy affecting the left upper extremity, mild-to-moderate in degree electrically.   IMPRESSION/PLAN: 1. Dementia, most likely vascular  - She has moderate to severe global deficits on her cognitive testing (15/30) especially with executive and visuospatial skills.  - Based on the results of the testing and her increased problems with memory, she was strongly advised to stop driving  - Start Aricept 5 mg daily for one month, then increase to 10 mg daily. Common and side effects discussed  - Home safety issues discussed  - Recommended patient start using a pillbox, make notes for appointments, as well as bringing her son to her next visit  2.  Bilateral hand paresthesias due to severe canal stenosis at C4-5 and C5-6 with evidence of cord myelomalacia  - She denies any neck pain and there is no weakness on exam, however she has persistent paresthesias of the hands  - Referral back to her spine surgeon was  discussed, but patient would like to think about this  3.  Idiopathic peripheral neuropathy  - Continue gabapentin 300 mg twice daily  - Encouraged use a 4 wheeled Rollator  4.  S/p lumbar fusion and decompression in February 2016, doing well   Return to clinic in 4-month.   The duration of this appointment visit was 30 minutes of face-to-face time with the patient.  Greater than 50% of this time was spent in counseling, explanation of diagnosis, planning of further management, and coordination of care.   Thank you for allowing me to participate in patient's care.  If I can answer any additional questions, I would be pleased to do so.    Sincerely,    Donika K. PPosey Pronto DO

## 2014-07-02 NOTE — Patient Instructions (Addendum)
1. You did poorly on your memory and skills test today.   2. Please stop driving for safety reasons and ask friends/family to help with transportation 3. Start using a pillbox, make notes, and try to put things in the same places so you do not misplace items 4. Start Aricept 5mg  tablet - take 1 tablet daily for one month, then increase to 2 tablets. 5. Please remember to bring your medication bottles next time 6. Encouraged to use 4-wheeled walker 7. Return to clinic in 6 months

## 2014-09-22 ENCOUNTER — Other Ambulatory Visit: Payer: Self-pay

## 2014-09-22 DIAGNOSIS — Z1231 Encounter for screening mammogram for malignant neoplasm of breast: Secondary | ICD-10-CM

## 2014-09-23 ENCOUNTER — Ambulatory Visit
Admission: RE | Admit: 2014-09-23 | Discharge: 2014-09-23 | Disposition: A | Discharge status: Home / Self Care | Payer: PPO | Source: Ambulatory Visit

## 2014-09-23 DIAGNOSIS — Z1231 Encounter for screening mammogram for malignant neoplasm of breast: Secondary | ICD-10-CM

## 2014-09-24 ENCOUNTER — Other Ambulatory Visit: Payer: Self-pay | Admitting: Internal Medicine

## 2014-09-24 DIAGNOSIS — R928 Other abnormal and inconclusive findings on diagnostic imaging of breast: Secondary | ICD-10-CM

## 2014-10-02 ENCOUNTER — Ambulatory Visit
Admission: RE | Admit: 2014-10-02 | Discharge: 2014-10-02 | Disposition: A | Discharge status: Home / Self Care | Payer: PPO | Source: Ambulatory Visit | Attending: Internal Medicine | Admitting: Internal Medicine

## 2014-10-02 DIAGNOSIS — R928 Other abnormal and inconclusive findings on diagnostic imaging of breast: Secondary | ICD-10-CM

## 2014-10-29 NOTE — Patient Outreach (Signed)
[place] [facility]) Care Management  10/29/2014  Misty Patterson Oct 21, 1926 [id number]    Referral from HTA tier 4 list, assigned to Sherrin Daisy, [facility] for patient outreach.  Lesa Vandall L. Hildreth Orsak, [place] Care Management Assistant

## 2014-11-18 ENCOUNTER — Other Ambulatory Visit: Payer: Self-pay | Admitting: *Deleted

## 2014-11-18 NOTE — Patient Outreach (Signed)
[place] [facility]) Care Management  11/18/2014  MORRISA ALDABA [birth year] [id number]   [employer] tier 4 referral:  Telephone call to patient; HIPPA compliant voice mail left requesting call back.  Plan: Will follow up.  Sherrin Daisy, RN BSN [place] Management Coordinator [facility] Care Management  [phone]

## 2014-11-19 ENCOUNTER — Other Ambulatory Visit: Payer: Self-pay | Admitting: *Deleted

## 2014-11-19 NOTE — Patient Outreach (Signed)
[place] [facility]) Care Management  11/19/2014  Misty Patterson 11-14-1926 [id number]  Telephone call to patient; HIPPA compliant voice mail left requesting call back.  Plan: Will follow up.  Sherrin Daisy, RN BSN [place] Management Coordinator [facility] Care Management  [phone]

## 2014-11-20 ENCOUNTER — Other Ambulatory Visit: Payer: Self-pay | Admitting: *Deleted

## 2014-11-20 ENCOUNTER — Encounter: Payer: Self-pay | Admitting: *Deleted

## 2014-11-20 NOTE — Patient Outreach (Signed)
[place] [facility]) Care Management  11/20/2014  Misty Patterson 02/15/1927 [id number]  Telephone call to patient; HIPPA compliant voice mail left requesting call back.  This is 3rd telephone outreach attempt.  Plan: Will send outreach letter and follow up in 10 business days.   Sherrin Daisy, RN BSN [place] Management Coordinator [facility] Care Management  [phone]

## 2014-12-08 ENCOUNTER — Other Ambulatory Visit: Payer: Self-pay | Admitting: Gastroenterology

## 2014-12-08 ENCOUNTER — Encounter: Payer: Self-pay | Admitting: *Deleted

## 2014-12-08 DIAGNOSIS — R1310 Dysphagia, unspecified: Secondary | ICD-10-CM

## 2014-12-08 NOTE — Patient Outreach (Signed)
[place] [facility]) Care Management  12/08/2014  CRYSTIN LECHTENBERG 08-Feb-1927 [id number]   No response after call attempts and outreach letter.  Plan: case out and send MD closure letter. Sherrin Daisy, RN BSN [place] Management Coordinator [facility] Care Management  [phone]

## 2014-12-09 NOTE — Patient Outreach (Signed)
[place] [facility]) Care Management  12/09/2014  TU SHIMMEL 01-03-1927 [id number]   Notification received from Sherrin Daisy, RN to close case due to inability to contact patient.  Damita L. Rhodie, [place] Care Management Assistant

## 2014-12-11 ENCOUNTER — Other Ambulatory Visit: Payer: PPO

## 2015-01-01 ENCOUNTER — Other Ambulatory Visit: Payer: Self-pay | Admitting: Orthopedic Surgery

## 2015-01-01 ENCOUNTER — Encounter: Payer: Self-pay | Admitting: Neurology

## 2015-01-01 ENCOUNTER — Ambulatory Visit (INDEPENDENT_AMBULATORY_CARE_PROVIDER_SITE_OTHER): Payer: PPO | Admitting: Neurology

## 2015-01-01 VITALS — BP 140/78 | HR 69 | Ht 65.0 in | Wt 149.0 lb

## 2015-01-01 DIAGNOSIS — M4802 Spinal stenosis, cervical region: Secondary | ICD-10-CM | POA: Diagnosis not present

## 2015-01-01 DIAGNOSIS — G609 Hereditary and idiopathic neuropathy, unspecified: Secondary | ICD-10-CM

## 2015-01-01 DIAGNOSIS — F0390 Unspecified dementia without behavioral disturbance: Secondary | ICD-10-CM | POA: Diagnosis not present

## 2015-01-01 DIAGNOSIS — G959 Disease of spinal cord, unspecified: Secondary | ICD-10-CM

## 2015-01-01 NOTE — Progress Notes (Signed)
Follow-up Visit   Date: 01/01/2015    Misty Patterson MRN: [id number] DOB: 1927/03/09   Interim History: Misty Patterson is a 81 y.o. right-handed Caucasian female with history of peripheral vascular disease s/p R popliteal stent placement (05/02/2013), depression, GERD, hypertension, ovarian cancer, CKD returning to the clinic for follow-up of generalized malaise and paresthesias of her hands.   History of present illness: In 2012, she suffered a fall while shopping in [place] where her leg got caught by plastic covering off the racks. Since then she has neck pain, bilateral arm achy pain, numbness/tingling of the hands. She has seen a number of physicians since then including orthopedic surgery. She completed physical therapy which helped her arm numbness transiently. She has also underwent electrodiagnostic testing in November 2014 by Dr. Jannifer Franklin at [facility] which showed bilateral carpal tunnel syndrome, possible overlying peripheral neuropathy based on prolongation of sensory latency, and C7-8 radiculopathy on the left side. She denies any alleviating or exacerbating factors for her paresthesias are arm pain. She also complains of right leg foot twitching only occurring at nighttime. Movements are pain does and does not have any associated crawling sensation. She denies the urge to move her legs.   She is a very active individual up until recently when her episodic weakness has limited what she is able to do. Previously, she was cutting her own wood and taking care of her garden.   UPDATE 11/21/2013:  There has been no interval change in her symptoms and she continues to tired easily with continued arm pain and paresthesias. She is taking gabapentin 200 mg at bedtime without any significant relief. MRI of the cervical spine showed chronic spinal stenosis with mass effect and myelomalacia at C4-C5-C5-C6. There is severe by foraminal stenosis at these levels. She also has advanced degenerative changes  and severe C8 by foraminal stenosis. Overall, imaging appears stable from 2014.   UPDATE 02/20/2014:  She has on new symptoms and no change in her bilateral hand numbness and weakness.  She did get a Life Alert System and is wearing it.  She continues to have gait difficulty from low back pain and her neuropathy.   UPDATE 07/02/2014:  She underwent lumbar fusion and decompression at L4-5 and reports having improved low back pain.  Her gait remains unsteady and she uses her walker at home.  Fortunately, she has not had any falls.  She endorses getting confused and missed her DEXA appointment this week. She also missed her last follow-up visit with [place].  She does endorse difficulty with her memory and getting lost at times. Concentration and attention also seems to be impaired. She she did drive to her appointment today, even though she does feel uncomfortable at time on the road. She has not been involved in any motor vehicle accidents.   UPDATE 01/01/2015:  There has been a slow functional decline.  She is no longer doing any gardening because of fatigue and low back pain.  Her arms continue to feel numb and weak at times.  Against my recommendations, she continues to drive and says that she has not been involved in any MVA.  She still manages her own finances and medication, but does feel that her memory is getting worse because she cannot recall things as well.  She is scheduled to have cervical decompression in December.     Medications:  Current Outpatient Prescriptions on File Prior to Visit  Medication Sig Dispense Refill  . acetaminophen-codeine (TYLENOL #3)  300-30 MG per tablet   1  . albuterol (PROVENTIL HFA;VENTOLIN HFA) 108 (90 BASE) MCG/ACT inhaler Inhale 2 puffs into the lungs every 6 (six) hours as needed for wheezing or shortness of breath. ProAir    . amLODipine (NORVASC) 5 MG tablet Take 5 mg by mouth daily.    Marland Kitchen atenolol (TENORMIN) 25 MG tablet Take 12.5 mg by mouth daily.    Marland Kitchen b  complex vitamins tablet Take 1 tablet by mouth daily.    . Blood Glucose Monitoring Suppl (BLOOD GLUCOSE METER KIT AND SUPPLIES) KIT Dispense based on patient and insurance preference. Use up to four times daily as directed. (FOR ICD-9 250.00, 250.01). 1 each 3  . clopidogrel (PLAVIX) 75 MG tablet   3  . diazepam (VALIUM) 5 MG tablet Take 5 mg by mouth at bedtime.     . donepezil (ARICEPT) 5 MG tablet Take 1 tablet daily for one month, then increase to 2 tablets. 60 tablet 3  . ESTRACE VAGINAL 0.1 MG/GM vaginal cream   3  . famotidine (PEPCID) 20 MG tablet   3  . gabapentin (NEURONTIN) 300 MG capsule Take 1 capsule (300 mg total) by mouth 2 (two) times daily. (Patient taking differently: Take 300 mg by mouth 3 (three) times daily. ) 60 capsule 5  . hydrochlorothiazide (HYDRODIURIL) 25 MG tablet Take 25 mg by mouth daily.     . meloxicam (MOBIC) 7.5 MG tablet   3  . Misc. Devices (ROLLATOR ULTRA-LIGHT) MISC 1 each by Does not apply route daily. 1 each 0  . NOVOLOG 100 UNIT/ML injection   0  . oxyCODONE-acetaminophen (PERCOCET/ROXICET) 5-325 MG per tablet Take 1 tablet by mouth every 6 (six) hours as needed for severe pain. 20 tablet 0  . sertraline (ZOLOFT) 25 MG tablet Take 25 mg by mouth daily.     . traMADol-acetaminophen (ULTRACET) 37.5-325 MG per tablet   0  . traZODone (DESYREL) 50 MG tablet Take 50 mg by mouth at bedtime.     No current facility-administered medications on file prior to visit.    Allergies:  Allergies  Allergen Reactions  . Aspirin Other (See Comments)    REACTION:  Choking sensation, possible throat swelling  . Formaldehyde Swelling    Tongue swelling  . Sodium Pentobarbital [Pentobarbital] Other (See Comments)    Memory Loss     Review of Systems:  CONSTITUTIONAL: No fevers, chills, night sweats, or weight loss.   EYES: No visual changes or eye pain ENT: No hearing changes.  No history of nose bleeds.   RESPIRATORY: No cough, wheezing and shortness of  breath.   CARDIOVASCULAR: Negative for chest pain, and palpitations.   GI: Negative for abdominal discomfort, blood in stools or black stools.  No recent change in bowel habits.   GU:  No history of incontinence.   MUSCLOSKELETAL: + history of joint pain or swelling.  No myalgias.   SKIN: Negative for lesions, rash, and itching.   ENDOCRINE: Negative for cold or heat intolerance, polydipsia or goiter.   PSYCH:  No depression or anxiety symptoms.   NEURO: As Above.   Vital Signs:  BP 140/78 mmHg  Pulse 69  Ht '5\' 5"'  (1.651 m)  Wt 149 lb (67.586 kg)  BMI 24.79 kg/m2  SpO2 97%  Neurological Exam: MENTAL STATUS:  She is oriented to year, month, and day.  Cannot name the president "colored man". She perseverates and has tangential thought process.  Montreal Cognitive Assessment  07/02/2014  Visuospatial/ Executive (0/5) 1  Naming (0/3) 2  Attention: Read list of digits (0/2) 2  Attention: Read list of letters (0/1) 0  Attention: Serial 7 subtraction starting at 100 (0/3) 0  Language: Repeat phrase (0/2) 2  Language : Fluency (0/1) 0  Abstraction (0/2) 0  Delayed Recall (0/5) 2  Orientation (0/6) 5  Total 14  Adjusted Score (based on education) 15    CRANIAL NERVES: Face is symmetric.  MOTOR:  Motor strength is 5/5 in all extremities, except bilateral intrinsic hand weakness 4/5.  No pronator drift.  Tone is normal.    MSRs:  Reflexes are 2+/4 in the upper extremities and absent in the lower extremities.   SENSORY: Absent vibration and pin prick distal to knees bilaterally. Sensation in the upper extremities is intact.  COORDINATION/GAIT: Gait is moderately wide-based, but stable. Unassisted gait.   Data: CT head wo contrast 12/12/2011: Moderate small vessel ischemic change and atrophy. No acute intracranial abnormality.   EMG 01/28/2013:  Nerve conduction studies done on both upper extremities shows evidence of a mild right carpal tunnel syndrome, and a borderline left  carpal tunnel syndrome. There is diffuse prolongation of the sensory latencies in the upper extremities, and the possibility of an overlying peripheral neuropathy should be considered. The patient does report some numbness and burning sensations in the feet. EMG evaluation of the right upper extremity shows some mild distal chronic denervation consistent with a peripheral neuropathy, but no evidence of an overlying cervical radiculopathy. EMG evaluation of the left upper extremity shows findings consistent with a chronic stable C8 and C7 radiculopathy.   MRI cervical spine 10/21/2013: 1. No acute findings in the cervical spine.  2. Advanced degeneration of C5-C6 and C6-C7 in the setting of C3 through C5 posterior element ankylosis (congenital or degenerative). Chronic spinal stenosis with spinal cord mass effect and abnormal cord signal (myelomalacia) at the C4-C5 through C5-C6 levels. Associated severe biforaminal stenosis at these levels. The appearance has not significantly changed since 2014.  3. Advanced degenerative changes at the anterior C1-C2 articulation and at C2-C3 are stable, with foraminal stenosis but no spinal stenosis.  4. Chronic mild anterolisthesis at C7-T1 with stable degenerative changes resulting in severe C8 biforaminal stenosis.  Labs 10/10/2013:  Mg 1.9, vitamin B12 682, copper 99, Cr 1.22*, TSH 4.7*  EMG upper extremities 12/01/2013: 1. Right median neuropathy, at or distal to the wrist consistent with the clinical diagnosis of carpal tunnel syndrome. Overall, these findings are mild to moderate in degree electrically. 2. 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 3. Chronic C7 radiculopathy affecting bilateral upper extremities, moderate in degree electrically.  4. Chronic C6 radiculopathy affecting the left upper extremity, mild-to-moderate in degree electrically.   IMPRESSION/PLAN: 1. Vascular dementia  -  She has moderate to severe global deficits on her cognitive testing (15/30) especially with executive and visuospatial skills.  - Strongly advised to stop driving  - Continue donezepil 10 mg daily.   - Home safety issues discussed  - Recommended patient start using a pillbox, make notes for appointments    - She is requesting a caregiver post-op at home, which I explained would need to be arranged based on her needs after surgery  2.  Bilateral hand paresthesias due to severe canal stenosis at C4-5 and C5-6 with evidence of cord myelomalacia  - She is scheduled to have cervical decompression and fusion in December 7th by Dr.  Dumonski  3.  Idiopathic peripheral neuropathy  - Continue gabapentin 300 mg twice daily  - Encouraged use a 4 wheeled Rollator  4.  S/p lumbar fusion and decompression in February 2016   Return to clinic in 9-month.   The duration of this appointment visit was 30 minutes of face-to-face time with the patient.  Greater than 50% of this time was spent in counseling, explanation of diagnosis, planning of further management, and coordination of care.   Thank you for allowing me to participate in patient's care.  If I can answer any additional questions, I would be pleased to do so.    Sincerely,    Donika K. PPosey Pronto DO

## 2015-01-01 NOTE — Patient Instructions (Addendum)
Continue donepezil 10mg  daily and gabapentin 300mg  twice daily Return to clinic in 6-9 months

## 2015-01-13 ENCOUNTER — Other Ambulatory Visit: Payer: Self-pay | Admitting: Orthopedic Surgery

## 2015-01-18 ENCOUNTER — Telehealth: Payer: Self-pay | Admitting: Cardiovascular Disease

## 2015-01-18 NOTE — Telephone Encounter (Signed)
Request for surgical clearance:  1. What type of surgery is being performed? Cervical Decompression  2. When is this surgery scheduled?  01/28/15  3. Are there any medications that need to be held prior to surgery and how long?    Blood thinners   Plavix  Unknown depends on the Clearance from [facility]  4. Name of physician performing surgery?  Dr. Lynann Bologna- [facility]  5. What is your office phone and fax number? [phone]   Fax [phone]

## 2015-01-18 NOTE — Telephone Encounter (Signed)
**Note De-Identified  Obfuscation** Misty Patterson is advised that the pt has a new pt/surgical clearance appt scheduled with Dr Acie Fredrickson on 01/22/15 and that her surgical clearance will be addressed at that time. Misty Patterson verbalized understanding and thanked me for calling her back. She states that she will fax [place] a surgical clearance form to be filled out once the pt has been seen.  Will forward to Dr Acie Fredrickson and his nurse, Sharyn Lull, as Juluis Rainier.

## 2015-01-20 ENCOUNTER — Ambulatory Visit
Admission: RE | Admit: 2015-01-20 | Discharge: 2015-01-20 | Disposition: A | Discharge status: Home / Self Care | Payer: PPO | Source: Ambulatory Visit | Attending: Orthopedic Surgery | Admitting: Orthopedic Surgery

## 2015-01-20 DIAGNOSIS — G959 Disease of spinal cord, unspecified: Secondary | ICD-10-CM

## 2015-01-22 ENCOUNTER — Encounter (HOSPITAL_COMMUNITY): Payer: PPO

## 2015-01-22 ENCOUNTER — Encounter: Payer: Self-pay | Admitting: Cardiovascular Disease

## 2015-01-22 ENCOUNTER — Ambulatory Visit (INDEPENDENT_AMBULATORY_CARE_PROVIDER_SITE_OTHER): Payer: PPO | Admitting: Cardiovascular Disease

## 2015-01-22 VITALS — BP 130/80 | HR 59 | Ht 65.5 in | Wt 147.0 lb

## 2015-01-22 DIAGNOSIS — R079 Chest pain, unspecified: Secondary | ICD-10-CM | POA: Diagnosis not present

## 2015-01-22 DIAGNOSIS — R0789 Other chest pain: Secondary | ICD-10-CM | POA: Diagnosis not present

## 2015-01-22 DIAGNOSIS — I10 Essential (primary) hypertension: Secondary | ICD-10-CM

## 2015-01-22 DIAGNOSIS — R0600 Dyspnea, unspecified: Secondary | ICD-10-CM

## 2015-01-22 DIAGNOSIS — R9431 Abnormal electrocardiogram [ECG] [EKG]: Secondary | ICD-10-CM

## 2015-01-22 NOTE — Patient Instructions (Signed)
Medication Instructions:  Your physician recommends that you continue on your current medications as directed. Please refer to the Current Medication list given to you today.   Labwork: none  Testing/Procedures: Your physician has requested that you have a lexiscan myoview. For further information please visit [email or web address]. Please follow instruction sheet, as given.    Follow-Up: Your physician recommends that you schedule a follow-up appointment in: as needed with Dr. Acie Fredrickson          If you need a refill on your cardiac medications before your next appointment, please call your pharmacy.

## 2015-01-22 NOTE — Progress Notes (Signed)
Cardiology Office Note   Date:  01/22/2015   ID:  Misty Patterson, DOB 03-01-1927, MRN [id number]  PCP:  Wenda Low, MD  Cardiologist:   Thayer Headings, MD   Chief Complaint  Patient presents with  . Pre-op Exam    for surgical clearance for neck surgery    Problem list 1. Dementia 2. Hypertension 3. History of right leg DVT in 2008 4.  Dyslipidemia 5. Cervical disc disease     History of Present Illness: Misty Patterson is a 81 y.o. female who presents for pre-op visit prior to neck surgery. She has some degree of dementia.  Is here alone.   Was not sure why she was here until I reminded her.  Hx of HTN.  Reports occasional episodes of left sided chest pain  Used to walk 6 miles a day ( less than 3 years ago)  ,  [address] and hurt herself and has not walked much since that time.   Nonsmoker.  Works as a Emergency planning/management officer .  Previously worked for [employer].     Past Medical History  Diagnosis Date  . Cancer ([place])     ovarian  . Restless leg syndrome   . DVT (deep venous thrombosis) ([facility])     right leg  . Peripheral neuropathy ([place])   . Dyslipidemia     but doesn't take any meds   . Ovarian cancer ([place])   . Osteoporosis     takes Vit D daily  . Incontinence of urine   . Esophageal dysmotility   . Zenker's diverticulum     small  . Decreased GFR   . Chronic kidney disease   . DJD (degenerative joint disease) of cervical spine   . Radiculopathy   . CTS (carpal tunnel syndrome)   . Anxiety     takes Valium daily  . Hypertension     takes Amlodipine and Atenolol daily  . Insomnia     takes Trazodone nightly  . Depression     takes Zoloft daily  . COPD (chronic obstructive pulmonary disease) ([place])     ProAir daily as needed  . GERD (gastroesophageal reflux disease)     takes pepcid daily  . Pneumonia     hx of-15+yrs ago  . Peripheral vascular disease ([place])     takes Plavix daily   . Chronic back pain   . Cervical disc disease   . Neuropathy  ([place])   . Urinary frequency   . Nocturia     Past Surgical History  Procedure Laterality Date  . Vein surgery  1950  . Abdominal aortagram  Feb. 20, 2015  . Abdominal aortagram N/A 05/02/2013    Procedure: ABDOMINAL Maxcine Ham;  Surgeon: Elam Dutch, MD;  Location: [facility] CATH LAB;  Service: Cardiovascular;  Laterality: N/A;  . Abdominal hysterectomy    . Tonsillectomy    . Cataract surgery Bilateral   . Colonoscopy    . Back surgery       Current Outpatient Prescriptions  Medication Sig Dispense Refill  . albuterol (PROVENTIL HFA;VENTOLIN HFA) 108 (90 BASE) MCG/ACT inhaler Inhale 2 puffs into the lungs every 4 (four) hours as needed for wheezing or shortness of breath. ProAir    . amLODipine (NORVASC) 5 MG tablet Take 5 mg by mouth 2 (two) times daily.     Marland Kitchen atenolol (TENORMIN) 25 MG tablet Take 12.5 mg by mouth daily.    Marland Kitchen b complex vitamins tablet Take 1 tablet  by mouth daily.    . Cholecalciferol (VITAMIN D) 2000 UNITS tablet Take 2,000 Units by mouth daily.    . diazepam (VALIUM) 5 MG tablet Take 5 mg by mouth at bedtime as needed for anxiety or sedation.     Marland Kitchen donepezil (ARICEPT) 5 MG tablet Take 1 tablet daily for one month, then increase to 2 tablets. 60 tablet 3  . famotidine (PEPCID) 20 MG tablet Take 20 mg by mouth 2 (two) times daily.   3  . gabapentin (NEURONTIN) 300 MG capsule Take 300 mg by mouth 3 (three) times daily.    . hydrochlorothiazide (HYDRODIURIL) 25 MG tablet Take 25 mg by mouth daily.     . Misc. Devices (ROLLATOR ULTRA-LIGHT) MISC 1 each by Does not apply route daily. 1 each 0  . oxyCODONE-acetaminophen (PERCOCET/ROXICET) 5-325 MG per tablet Take 1 tablet by mouth every 6 (six) hours as needed for severe pain. 20 tablet 0  . sertraline (ZOLOFT) 50 MG tablet Take 1 tablet by mouth daily  3  . traMADol-acetaminophen (ULTRACET) 37.5-325 MG per tablet Take 1-2 tablets by mouth 3 (three) times daily as needed (pain).   0  . traZODone (DESYREL) 50 MG tablet  Take 50 mg by mouth at bedtime.     No current facility-administered medications for this visit.    Allergies:   Aspirin; Formaldehyde; and Sodium pentobarbital    Social History:  The patient  reports that she has never smoked. She has never used smokeless tobacco. She reports that she does not drink alcohol or use illicit drugs.   Family History:  The patient's family history includes Cancer in her sister; Diabetes in her brother; Healthy in her son; Hypertension in her brother and mother; Kidney disease in her sister; Lung cancer in her son; Other in her father; Stroke in her brother, brother, brother, and mother.    ROS:  Please see the history of present illness.    Review of Systems: Constitutional:  denies fever, chills, diaphoresis, appetite change and fatigue.  HEENT: denies photophobia, eye pain, redness, hearing loss, ear pain, congestion, sore throat, rhinorrhea, sneezing, neck pain, neck stiffness and tinnitus.  Respiratory: denies SOB, DOE, cough, chest tightness, and wheezing.  Cardiovascular: admits to chest pain,    Gastrointestinal: denies nausea, vomiting, abdominal pain, diarrhea, constipation, blood in stool.  Genitourinary: denies dysuria, urgency, frequency, hematuria, flank pain and difficulty urinating.  Musculoskeletal: denies  myalgias, back pain, joint swelling, arthralgias and gait problem.   Skin: denies pallor, rash and wound.  Neurological: denies dizziness, seizures, syncope, weakness, light-headedness, numbness and headaches.   Hematological: denies adenopathy, easy bruising, personal or family bleeding history.  Psychiatric/ Behavioral: denies suicidal ideation, mood changes, confusion, nervousness, sleep disturbance and agitation.       All other systems are reviewed and negative.    PHYSICAL EXAM: VS:  BP 130/80 mmHg  Pulse 59  Ht 5' 5.5" (1.664 m)  Wt 147 lb (66.679 kg)  BMI 24.08 kg/m2 , BMI Body mass index is 24.08 kg/(m^2). GEN: Well  nourished, well developed, in no acute distress HEENT: normal Neck: no JVD, carotid bruits, or masses Cardiac: RRR; no murmurs, rubs, or gallops,no edema  Respiratory:  clear to auscultation bilaterally, normal work of breathing GI: soft, nontender, nondistended, + BS MS: no deformity or atrophy Skin: warm and dry, no rash Neuro:  Strength and sensation are intact Psych: normal   EKG:  EKG is ordered today. The ekg ordered today demonstrates sinus brady at  59.  NS ST changes.    Recent Labs: 05/01/2014: ALT 24; BUN 16; Creatinine, Ser 1.23*; Hemoglobin 14.2; Platelets 205; Potassium 3.4*; Sodium 136    Lipid Panel No results found for: CHOL, TRIG, HDL, CHOLHDL, VLDL, LDLCALC, LDLDIRECT    Wt Readings from Last 3 Encounters:  01/22/15 147 lb (66.679 kg)  01/01/15 149 lb (67.586 kg)  07/02/14 146 lb 9 oz (66.48 kg)      Other studies Reviewed: Additional studies/ records that were reviewed today include: . Review of the above records demonstrates:    ASSESSMENT AND PLAN:  1.  Preoperative visit prior to next surgery: Misty Patterson presents for preoperative clearance. She has dementia and it took some coaching from me  to get her to remember why she was here. She has intermittent episodes of chest pain shortness breath but her history is clearly not reliable. She does have nonspecific changes on her EKG. She has long-standing history of hypertension and these may be due to repolarization abnormalities.    I  think that we should proceed with a [place] study for preoperative evaluation prior to her neck surgery. I will see her on an as-needed basis assuming that the Myoview study is normal.  Current medicines are reviewed at length with the patient today.  The patient does not have concerns regarding medicines.  The following changes have been made:  no change  Labs/ tests ordered today include:   Orders Placed This Encounter  Procedures  . Myocardial Perfusion  Imaging  . EKG 12-Lead     Disposition:   FU with me as needed.     Tank Difiore, Wonda Cheng, MD  01/22/2015 9:17 AM    [facility] [place], [place], [place]  [address] Phone: [phone]; Fax: [phone]   [facility]  [address] [place],   [address] [phone]   Fax [phone]

## 2015-01-25 ENCOUNTER — Encounter (HOSPITAL_COMMUNITY)
Admission: RE | Admit: 2015-01-25 | Discharge: 2015-01-25 | Disposition: A | Discharge status: Home / Self Care | Payer: PPO | Source: Ambulatory Visit | Attending: Orthopedic Surgery | Admitting: Orthopedic Surgery

## 2015-01-25 ENCOUNTER — Encounter (HOSPITAL_COMMUNITY): Payer: Self-pay

## 2015-01-25 ENCOUNTER — Ambulatory Visit (HOSPITAL_BASED_OUTPATIENT_CLINIC_OR_DEPARTMENT_OTHER): Payer: PPO

## 2015-01-25 ENCOUNTER — Telehealth: Payer: Self-pay | Admitting: Cardiovascular Disease

## 2015-01-25 DIAGNOSIS — R079 Chest pain, unspecified: Secondary | ICD-10-CM | POA: Diagnosis not present

## 2015-01-25 DIAGNOSIS — R0600 Dyspnea, unspecified: Secondary | ICD-10-CM

## 2015-01-25 DIAGNOSIS — I10 Essential (primary) hypertension: Secondary | ICD-10-CM | POA: Diagnosis not present

## 2015-01-25 DIAGNOSIS — Z01818 Encounter for other preprocedural examination: Secondary | ICD-10-CM

## 2015-01-25 DIAGNOSIS — M4712 Other spondylosis with myelopathy, cervical region: Secondary | ICD-10-CM | POA: Diagnosis not present

## 2015-01-25 DIAGNOSIS — G959 Disease of spinal cord, unspecified: Secondary | ICD-10-CM | POA: Diagnosis not present

## 2015-01-25 HISTORY — DX: Unspecified dementia, unspecified severity, without behavioral disturbance, psychotic disturbance, mood disturbance, and anxiety: F03.90

## 2015-01-25 LAB — CBC WITH DIFFERENTIAL/PLATELET
BASOS PCT: 0 %
Basophils Absolute: 0 10*3/uL (ref 0.0–0.1)
EOS ABS: 0.1 10*3/uL (ref 0.0–0.7)
Eosinophils Relative: 1 %
HCT: 43.9 % (ref 36.0–46.0)
HEMOGLOBIN: 14.6 g/dL (ref 12.0–15.0)
LYMPHS ABS: 3.8 10*3/uL (ref 0.7–4.0)
LYMPHS PCT: 44 %
MCH: 28.7 pg (ref 26.0–34.0)
MCHC: 33.3 g/dL (ref 30.0–36.0)
MCV: 86.4 fL (ref 78.0–100.0)
MONO ABS: 0.8 10*3/uL (ref 0.1–1.0)
Monocytes Relative: 9 %
NEUTROS ABS: 4 10*3/uL (ref 1.7–7.7)
Neutrophils Relative %: 46 %
PLATELETS: 225 10*3/uL (ref 150–400)
RBC: 5.08 MIL/uL (ref 3.87–5.11)
RDW: 13.3 % (ref 11.5–15.5)
WBC: 8.6 10*3/uL (ref 4.0–10.5)

## 2015-01-25 LAB — URINE MICROSCOPIC-ADD ON

## 2015-01-25 LAB — TYPE AND SCREEN
ABO/RH(D): A POS
ANTIBODY SCREEN: NEGATIVE

## 2015-01-25 LAB — URINALYSIS, ROUTINE W REFLEX MICROSCOPIC
Bilirubin Urine: NEGATIVE
GLUCOSE, UA: NEGATIVE mg/dL
KETONES UR: NEGATIVE mg/dL
NITRITE: NEGATIVE
PROTEIN: NEGATIVE mg/dL
Specific Gravity, Urine: 1.008 (ref 1.005–1.030)
UROBILINOGEN UA: 0.2 mg/dL (ref 0.0–1.0)
pH: 6.5 (ref 5.0–8.0)

## 2015-01-25 LAB — COMPREHENSIVE METABOLIC PANEL
ALBUMIN: 4.3 g/dL (ref 3.5–5.0)
ALK PHOS: 52 U/L (ref 38–126)
ALT: 27 U/L (ref 14–54)
ANION GAP: 7 (ref 5–15)
AST: 36 U/L (ref 15–41)
BUN: 19 mg/dL (ref 6–20)
CALCIUM: 9.3 mg/dL (ref 8.9–10.3)
CHLORIDE: 98 mmol/L — AB (ref 101–111)
CO2: 30 mmol/L (ref 22–32)
Creatinine, Ser: 1.09 mg/dL — ABNORMAL HIGH (ref 0.44–1.00)
GFR calc non Af Amer: 45 mL/min — ABNORMAL LOW (ref >60)
GFR, EST AFRICAN AMERICAN: 52 mL/min — AB (ref >60)
GLUCOSE: 112 mg/dL — AB (ref 65–99)
Potassium: 3.5 mmol/L (ref 3.5–5.1)
SODIUM: 135 mmol/L (ref 135–145)
Total Bilirubin: 1 mg/dL (ref 0.3–1.2)
Total Protein: 7.4 g/dL (ref 6.5–8.1)

## 2015-01-25 LAB — MYOCARDIAL PERFUSION IMAGING
CHL CUP NUCLEAR SDS: 4
CHL CUP NUCLEAR SRS: 1
CHL CUP NUCLEAR SSS: 5
CSEPPHR: 96 {beats}/min
LHR: 0.24
LV dias vol: 69 mL
LVSYSVOL: 24 mL
Rest HR: 52 {beats}/min
TID: 1.01

## 2015-01-25 LAB — PROTIME-INR
INR: 1.14 (ref 0.00–1.49)
Prothrombin Time: 14.8 seconds (ref 11.6–15.2)

## 2015-01-25 LAB — SURGICAL PCR SCREEN
MRSA, PCR: NEGATIVE
Staphylococcus aureus: NEGATIVE

## 2015-01-25 LAB — APTT: APTT: 29 s (ref 24–37)

## 2015-01-25 MED ORDER — TECHNETIUM TC 99M SESTAMIBI GENERIC - CARDIOLITE
32.3000 | Freq: Once | INTRAVENOUS | Status: AC | PRN
  Administered 2015-01-25: 32.3 via INTRAVENOUS

## 2015-01-25 MED ORDER — REGADENOSON 0.4 MG/5ML IV SOLN
0.4000 mg | Freq: Once | INTRAVENOUS | Status: AC
  Administered 2015-01-25: 0.4 mg via INTRAVENOUS

## 2015-01-25 MED ORDER — TECHNETIUM TC 99M SESTAMIBI GENERIC - CARDIOLITE
11.0000 | Freq: Once | INTRAVENOUS | Status: AC | PRN
  Administered 2015-01-25: 11 via INTRAVENOUS

## 2015-01-25 NOTE — Telephone Encounter (Signed)
Left detailed message on voice mail for [facility] Medication program voice mail.  I advised that per Dr. Acie Fredrickson he did not prescribe or give samples for any medications at last office visit on 11/11. I advised that they may call the office with any further questions.

## 2015-01-25 NOTE — Telephone Encounter (Signed)
New message     [facility] is calling - patient was schedule for nuclear stress today did not compete it  Patient having surgery on Thursday should they proceed or cancel

## 2015-01-25 NOTE — Telephone Encounter (Signed)
Spoke with Angela Nevin from [facility] and advised her that nuclear imaging study was completed today and that once Dr. Acie Fredrickson has reviewed, the result will be available in patient's chart.  She states that she will be able to access through epic and that that is the only notice of clearance needed.  She thanked me for the call.

## 2015-01-25 NOTE — Telephone Encounter (Signed)
New message      Pt states she got a sample of something for her kidneys in a red box at last ov.  They are trying to figure out what she got.  Please call and ok to leave msg on vm

## 2015-01-25 NOTE — Pre-Procedure Instructions (Signed)
    Misty Patterson  01/25/2015      [facility] [address], [place] - [address] [place] [address] Phone: [phone] Fax: [phone]    Your procedure is scheduled on 01-28-2015   Thursday   Report to [facility] Admitting at 5:30 A.M.   Call this number if you have problems the morning of surgery:  [phone]   Remember:  Do not eat food or drink liquids after midnight.   Take these medicines the morning of surgery with A SIP OF WATER albuterol inhalers as needed,amlodipine(Norvasc)Atenolol(Tenormin),aricept,famotidine(Pepcid),gabapentin(Neuroten)pain medication as needed,sertraline(Zoloft)   Do not wear jewelry, make-up or nail polish.  Do not wear lotions, powders, or perfumes.  You may not wear deodorant.  Do not shave 48 hours prior to surgery.    Do not bring valuables to the hospital.  [facility] is not responsible for any belongings or valuables.  Contacts, dentures or bridgework may not be worn into surgery.  Leave your suitcase in the car.  After surgery it may be brought to your room.  For patients admitted to the hospital, discharge time will be determined by your treatment team.     Special instructions:  See attached Sheet for instructions on CHG shower  Please read over the following fact sheets that you were given. Pain Booklet, Coughing and Deep Breathing, Blood Transfusion Information and Surgical Site Infection Prevention

## 2015-01-25 NOTE — Telephone Encounter (Signed)
Left message for Angela Nevin at [facility] to call office.

## 2015-01-26 NOTE — H&P (Signed)
PREOPERATIVE H&P  Chief Complaint: balance deterioration  HPI: Misty Patterson is a 81 y.o. female who presents with ongoing deterioration in balance  MRI reveals myelomalacia and SCC spanning C4-7 and instability at C7-T1 and C3-4  Patient has failed multiple forms of conservative care and continues to have pain (see office notes for additional details regarding the patient's full course of treatment)  Past Medical History  Diagnosis Date  . Cancer ([place])     ovarian  . Restless leg syndrome   . DVT (deep venous thrombosis) ([facility])     right leg  . Peripheral neuropathy ([place])   . Dyslipidemia     but doesn't take any meds   . Ovarian cancer ([place])   . Osteoporosis     takes Vit D daily  . Incontinence of urine   . Esophageal dysmotility   . Zenker's diverticulum     small  . Decreased GFR   . Chronic kidney disease   . DJD (degenerative joint disease) of cervical spine   . Radiculopathy   . CTS (carpal tunnel syndrome)   . Anxiety     takes Valium daily  . Hypertension     takes Amlodipine and Atenolol daily  . Insomnia     takes Trazodone nightly  . Depression     takes Zoloft daily  . COPD (chronic obstructive pulmonary disease) ([place])     ProAir daily as needed  . GERD (gastroesophageal reflux disease)     takes pepcid daily  . Pneumonia     hx of-15+yrs ago  . Chronic back pain   . Cervical disc disease   . Neuropathy ([place])   . Urinary frequency   . Nocturia   . Peripheral vascular disease ([place])   . Dementia     takes aricept   Past Surgical History  Procedure Laterality Date  . Vein surgery  1950  . Abdominal aortagram  Feb. 20, 2015  . Abdominal aortagram N/A 05/02/2013    Procedure: ABDOMINAL Maxcine Ham;  Surgeon: Elam Dutch, MD;  Location: [facility] CATH LAB;  Service: Cardiovascular;  Laterality: N/A;  . Abdominal hysterectomy    . Tonsillectomy    . Cataract surgery Bilateral   . Colonoscopy    . Back surgery     Social History   Social  History  . Marital Status: Widowed    Spouse Name: N/A  . Number of Children: 3  . Years of Education: N/A   Occupational History  .     Social History Main Topics  . Smoking status: Never Smoker   . Smokeless tobacco: Never Used  . Alcohol Use: No  . Drug Use: No  . Sexual Activity: Not on file   Other Topics Concern  . Not on file   Social History Narrative   Lives with son.   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   Highest level of education:  11th grade   Family History  Problem Relation Age of Onset  . Hypertension Mother   . Stroke Mother   . Lung cancer Son     Deceased, 34  . Healthy Son   . Other Father     MVA  . Hypertension Brother   . Stroke Brother   . Kidney disease Sister   . Stroke Brother   . Diabetes Brother     DM  . Stroke Brother   . Cancer Sister  throat   Allergies  Allergen Reactions  . Aspirin Other (See Comments)    REACTION:  Choking sensation, possible throat swelling  . Formaldehyde Swelling    Tongue swelling  . Sodium Pentobarbital [Pentobarbital] Other (See Comments)    Memory Loss   Prior to Admission medications   Medication Sig Start Date End Date Taking? Authorizing Provider  acetaminophen-codeine (TYLENOL #3) 300-30 MG tablet Take 1 tablet by mouth at bedtime as needed (pain).   Yes Historical Provider, MD  albuterol (PROVENTIL HFA;VENTOLIN HFA) 108 (90 BASE) MCG/ACT inhaler Inhale 2 puffs into the lungs every 4 (four) hours as needed for wheezing or shortness of breath.    Yes Historical Provider, MD  amLODipine (NORVASC) 5 MG tablet Take 5 mg by mouth 2 (two) times daily.    Yes Historical Provider, MD  atenolol (TENORMIN) 25 MG tablet Take 12.5 mg by mouth daily.   Yes Historical Provider, MD  B Complex-C (B-COMPLEX WITH VITAMIN C) tablet Take 1 tablet by mouth daily.   Yes Historical Provider, MD  cholecalciferol (VITAMIN D) 1000 UNITS tablet Take 2,000 Units by mouth  daily.   Yes Historical Provider, MD  diazepam (VALIUM) 5 MG tablet Take 5 mg by mouth at bedtime.    Yes Historical Provider, MD  donepezil (ARICEPT) 5 MG tablet Take 1 tablet daily for one month, then increase to 2 tablets. Patient taking differently: Take 5 mg by mouth at bedtime.  07/02/14  Yes Donika K Patel, DO  famotidine (PEPCID) 20 MG tablet Take 20 mg by mouth daily.  06/10/14  Yes Historical Provider, MD  gabapentin (NEURONTIN) 300 MG capsule Take 300 mg by mouth 2 (two) times daily.    Yes Historical Provider, MD  hydrochlorothiazide (HYDRODIURIL) 25 MG tablet Take 25 mg by mouth daily.  02/24/13  Yes Historical Provider, MD  Melatonin-Pyridoxine 3-1 MG TABS Take 1 tablet by mouth at bedtime.   Yes Historical Provider, MD  meloxicam (MOBIC) 15 MG tablet Take 15 mg by mouth daily as needed for pain.   Yes Historical Provider, MD  Misc. Devices (ROLLATOR ULTRA-LIGHT) MISC 1 each by Does not apply route daily. 02/20/14  Yes Alda Berthold, DO  Multiple Vitamin (MULTIVITAMIN WITH MINERALS) TABS tablet Take 1 tablet by mouth daily.   Yes Historical Provider, MD  OVER THE COUNTER MEDICATION Take 2 capsules by mouth daily. Omega Q Plus Resveratrol   Yes Historical Provider, MD  OVER THE COUNTER MEDICATION Take 2 capsules by mouth daily. Ultimate Colon Care Formula - herbal supplement   Yes Historical Provider, MD  sertraline (ZOLOFT) 50 MG tablet Take 50 mg by mouth at bedtime.   Yes Historical Provider, MD  sodium chloride (OCEAN) 0.65 % SOLN nasal spray Place 1 spray into both nostrils at bedtime as needed for congestion.   Yes Historical Provider, MD  solifenacin (VESICARE) 5 MG tablet Take 5 mg by mouth daily.   Yes Historical Provider, MD  traZODone (DESYREL) 50 MG tablet Take 50 mg by mouth at bedtime.   Yes Historical Provider, MD  oxyCODONE-acetaminophen (PERCOCET/ROXICET) 5-325 MG per tablet Take 1 tablet by mouth every 6 (six) hours as needed for severe pain. Patient not taking:  Reported on 01/25/2015 03/28/14   Alfonzo Beers, MD     All other systems have been reviewed and were otherwise negative with the exception of those mentioned in the HPI and as above.  Physical Exam: There were no vitals filed for this visit.  General: Alert, no  acute distress Cardiovascular: No pedal edema Respiratory: No cyanosis, no use of accessory musculature Skin: No lesions in the area of chief complaint Neurologic: Sensation intact distally Psychiatric: Patient is competent for consent with normal mood and affect Lymphatic: No axillary or cervical lymphadenopathy  MUSCULOSKELETAL: + hoffman's sign b/l  Assessment/Plan: Myelopathy Plan for Procedure(s): POSTERIOR CERVICAL DECOMPRESSION FUSION C3-T1 WITH INSTRUMENTATION AND ALLOGRAFT   Sinclair Ship, MD 01/26/2015 8:20 AM

## 2015-01-27 MED ORDER — CEFAZOLIN SODIUM-DEXTROSE 2-3 GM-% IV SOLR
2.0000 g | INTRAVENOUS | Status: AC
  Administered 2015-01-28: 2 g via INTRAVENOUS
  Filled 2015-01-27: qty 50

## 2015-01-27 MED ORDER — POVIDONE-IODINE 7.5 % EX SOLN
Freq: Once | CUTANEOUS | Status: DC
  Filled 2015-01-27: qty 118

## 2015-01-28 ENCOUNTER — Inpatient Hospital Stay (HOSPITAL_COMMUNITY): Payer: PPO | Admitting: Certified Registered Nurse Anesthetist

## 2015-01-28 ENCOUNTER — Inpatient Hospital Stay (HOSPITAL_COMMUNITY)
Admission: RE | Admit: 2015-01-28 | Discharge: 2015-01-30 | DRG: 472 | Disposition: A | Discharge status: Home / Self Care | Payer: PPO | Source: Ambulatory Visit | Attending: Orthopedic Surgery | Admitting: Orthopedic Surgery

## 2015-01-28 ENCOUNTER — Encounter (HOSPITAL_COMMUNITY): Payer: Self-pay | Admitting: *Deleted

## 2015-01-28 ENCOUNTER — Inpatient Hospital Stay (HOSPITAL_COMMUNITY): Payer: PPO

## 2015-01-28 ENCOUNTER — Encounter (HOSPITAL_COMMUNITY)
Admission: RE | Disposition: A | Discharge status: Home / Self Care | Payer: Self-pay | Source: Ambulatory Visit | Attending: Orthopedic Surgery

## 2015-01-28 DIAGNOSIS — K219 Gastro-esophageal reflux disease without esophagitis: Secondary | ICD-10-CM | POA: Diagnosis present

## 2015-01-28 DIAGNOSIS — F0390 Unspecified dementia without behavioral disturbance: Secondary | ICD-10-CM | POA: Diagnosis present

## 2015-01-28 DIAGNOSIS — Z79899 Other long term (current) drug therapy: Secondary | ICD-10-CM | POA: Diagnosis not present

## 2015-01-28 DIAGNOSIS — Z90710 Acquired absence of both cervix and uterus: Secondary | ICD-10-CM | POA: Diagnosis not present

## 2015-01-28 DIAGNOSIS — M4712 Other spondylosis with myelopathy, cervical region: Principal | ICD-10-CM | POA: Diagnosis present

## 2015-01-28 DIAGNOSIS — G9589 Other specified diseases of spinal cord: Secondary | ICD-10-CM | POA: Diagnosis present

## 2015-01-28 DIAGNOSIS — G2581 Restless legs syndrome: Secondary | ICD-10-CM | POA: Diagnosis present

## 2015-01-28 DIAGNOSIS — G959 Disease of spinal cord, unspecified: Secondary | ICD-10-CM | POA: Diagnosis present

## 2015-01-28 DIAGNOSIS — G9520 Unspecified cord compression: Secondary | ICD-10-CM | POA: Diagnosis present

## 2015-01-28 DIAGNOSIS — I739 Peripheral vascular disease, unspecified: Secondary | ICD-10-CM | POA: Diagnosis not present

## 2015-01-28 DIAGNOSIS — N189 Chronic kidney disease, unspecified: Secondary | ICD-10-CM | POA: Diagnosis not present

## 2015-01-28 DIAGNOSIS — Z886 Allergy status to analgesic agent status: Secondary | ICD-10-CM | POA: Diagnosis not present

## 2015-01-28 DIAGNOSIS — M4802 Spinal stenosis, cervical region: Secondary | ICD-10-CM | POA: Diagnosis not present

## 2015-01-28 DIAGNOSIS — Z8543 Personal history of malignant neoplasm of ovary: Secondary | ICD-10-CM | POA: Diagnosis not present

## 2015-01-28 DIAGNOSIS — M810 Age-related osteoporosis without current pathological fracture: Secondary | ICD-10-CM | POA: Diagnosis present

## 2015-01-28 DIAGNOSIS — J449 Chronic obstructive pulmonary disease, unspecified: Secondary | ICD-10-CM | POA: Diagnosis present

## 2015-01-28 DIAGNOSIS — I129 Hypertensive chronic kidney disease with stage 1 through stage 4 chronic kidney disease, or unspecified chronic kidney disease: Secondary | ICD-10-CM | POA: Diagnosis present

## 2015-01-28 DIAGNOSIS — E785 Hyperlipidemia, unspecified: Secondary | ICD-10-CM | POA: Diagnosis not present

## 2015-01-28 DIAGNOSIS — Z888 Allergy status to other drugs, medicaments and biological substances status: Secondary | ICD-10-CM

## 2015-01-28 DIAGNOSIS — Z419 Encounter for procedure for purposes other than remedying health state, unspecified: Secondary | ICD-10-CM

## 2015-01-28 HISTORY — PX: POSTERIOR CERVICAL FUSION/FORAMINOTOMY: SHX5038

## 2015-01-28 SURGERY — POSTERIOR CERVICAL FUSION/FORAMINOTOMY LEVEL 5
Anesthesia: General

## 2015-01-28 MED ORDER — ESMOLOL HCL 100 MG/10ML IV SOLN
INTRAVENOUS | Status: DC | PRN
  Administered 2015-01-28: 20 mg via INTRAVENOUS
  Administered 2015-01-28 (×2): 30 mg via INTRAVENOUS
  Administered 2015-01-28: 20 mg via INTRAVENOUS

## 2015-01-28 MED ORDER — ARTIFICIAL TEARS OP OINT
TOPICAL_OINTMENT | OPHTHALMIC | Status: DC | PRN
  Administered 2015-01-28: 1 via OPHTHALMIC

## 2015-01-28 MED ORDER — ROCURONIUM BROMIDE 50 MG/5ML IV SOLN
INTRAVENOUS | Status: AC
  Filled 2015-01-28: qty 1

## 2015-01-28 MED ORDER — PROPOFOL 500 MG/50ML IV EMUL
INTRAVENOUS | Status: DC | PRN
  Administered 2015-01-28: 50 ug/kg/min via INTRAVENOUS
  Administered 2015-01-28: 12:00:00 via INTRAVENOUS

## 2015-01-28 MED ORDER — ONDANSETRON HCL 4 MG/2ML IJ SOLN
INTRAMUSCULAR | Status: DC | PRN
  Administered 2015-01-28: 4 mg via INTRAVENOUS

## 2015-01-28 MED ORDER — ACETAMINOPHEN 325 MG PO TABS: 650.0000 mg | ORAL_TABLET | ORAL | Status: DC | PRN

## 2015-01-28 MED ORDER — SODIUM CHLORIDE 0.9 % IV SOLN
INTRAVENOUS | Status: DC
  Administered 2015-01-29: 75 mL/h via INTRAVENOUS

## 2015-01-28 MED ORDER — PROPOFOL 10 MG/ML IV BOLUS: INTRAVENOUS | Status: DC | PRN

## 2015-01-28 MED ORDER — SERTRALINE HCL 50 MG PO TABS
50.0000 mg | ORAL_TABLET | Freq: Every day | ORAL | Status: DC
  Administered 2015-01-28 – 2015-01-29 (×2): 50 mg via ORAL
  Filled 2015-01-28 (×2): qty 1

## 2015-01-28 MED ORDER — PROPOFOL 10 MG/ML IV BOLUS
INTRAVENOUS | Status: AC
  Filled 2015-01-28: qty 20

## 2015-01-28 MED ORDER — EPHEDRINE SULFATE 50 MG/ML IJ SOLN
INTRAMUSCULAR | Status: AC
  Filled 2015-01-28: qty 1

## 2015-01-28 MED ORDER — HYDROMORPHONE HCL 1 MG/ML IJ SOLN
0.2500 mg | INTRAMUSCULAR | Status: DC | PRN
  Administered 2015-01-28 (×3): 0.5 mg via INTRAVENOUS

## 2015-01-28 MED ORDER — SODIUM CHLORIDE 0.9 % IV SOLN: 250.0000 mL | INTRAVENOUS | Status: DC

## 2015-01-28 MED ORDER — GLYCOPYRROLATE 0.2 MG/ML IJ SOLN
INTRAMUSCULAR | Status: AC
Start: 2015-01-28 — End: 2015-01-28
  Filled 2015-01-28: qty 2

## 2015-01-28 MED ORDER — FENTANYL CITRATE (PF) 250 MCG/5ML IJ SOLN
INTRAMUSCULAR | Status: AC
  Filled 2015-01-28: qty 5

## 2015-01-28 MED ORDER — LABETALOL HCL 5 MG/ML IV SOLN
INTRAVENOUS | Status: DC | PRN
Start: 2015-01-28 — End: 2015-01-28
  Administered 2015-01-28: 10 mg via INTRAVENOUS

## 2015-01-28 MED ORDER — ONDANSETRON HCL 4 MG/2ML IJ SOLN: 4.0000 mg | INTRAMUSCULAR | Status: DC | PRN

## 2015-01-28 MED ORDER — ALBUTEROL SULFATE (2.5 MG/3ML) 0.083% IN NEBU: 2.5000 mg | INHALATION_SOLUTION | RESPIRATORY_TRACT | Status: DC | PRN

## 2015-01-28 MED ORDER — GLYCOPYRROLATE 0.2 MG/ML IJ SOLN
INTRAMUSCULAR | Status: DC | PRN
  Administered 2015-01-28 (×2): 0.2 mg via INTRAVENOUS

## 2015-01-28 MED ORDER — HYDRALAZINE HCL 20 MG/ML IJ SOLN
INTRAMUSCULAR | Status: DC | PRN
  Administered 2015-01-28: 10 mg via INTRAVENOUS

## 2015-01-28 MED ORDER — PROPOFOL 10 MG/ML IV BOLUS
INTRAVENOUS | Status: AC
  Filled 2015-01-28: qty 40

## 2015-01-28 MED ORDER — SUCCINYLCHOLINE CHLORIDE 20 MG/ML IJ SOLN
INTRAMUSCULAR | Status: DC | PRN
  Administered 2015-01-28: 80 mg via INTRAVENOUS

## 2015-01-28 MED ORDER — ZOLPIDEM TARTRATE 5 MG PO TABS: 5.0000 mg | ORAL_TABLET | Freq: Every evening | ORAL | Status: DC | PRN

## 2015-01-28 MED ORDER — SODIUM CHLORIDE 0.9 % IJ SOLN: 3.0000 mL | Freq: Two times a day (BID) | INTRAMUSCULAR | Status: DC

## 2015-01-28 MED ORDER — LACTATED RINGERS IV SOLN
INTRAVENOUS | Status: DC | PRN
  Administered 2015-01-28: 07:00:00 via INTRAVENOUS

## 2015-01-28 MED ORDER — SODIUM CHLORIDE 0.9 % IJ SOLN
INTRAMUSCULAR | Status: AC
  Filled 2015-01-28: qty 10

## 2015-01-28 MED ORDER — CEFAZOLIN SODIUM-DEXTROSE 2-3 GM-% IV SOLR
2.0000 g | Freq: Three times a day (TID) | INTRAVENOUS | Status: AC
  Administered 2015-01-28 – 2015-01-29 (×2): 2 g via INTRAVENOUS
  Filled 2015-01-28 (×3): qty 50

## 2015-01-28 MED ORDER — BUPIVACAINE-EPINEPHRINE 0.25% -1:200000 IJ SOLN
INTRAMUSCULAR | Status: DC | PRN
  Administered 2015-01-28: 10 mL

## 2015-01-28 MED ORDER — DARIFENACIN HYDROBROMIDE ER 7.5 MG PO TB24
7.5000 mg | ORAL_TABLET | Freq: Every day | ORAL | Status: DC
  Administered 2015-01-28 – 2015-01-29 (×2): 7.5 mg via ORAL
  Filled 2015-01-28 (×3): qty 1

## 2015-01-28 MED ORDER — DONEPEZIL HCL 5 MG PO TABS
5.0000 mg | ORAL_TABLET | Freq: Every day | ORAL | Status: DC
  Administered 2015-01-28 – 2015-01-29 (×2): 5 mg via ORAL
  Filled 2015-01-28 (×2): qty 1

## 2015-01-28 MED ORDER — SUCCINYLCHOLINE CHLORIDE 20 MG/ML IJ SOLN
INTRAMUSCULAR | Status: AC
  Filled 2015-01-28: qty 1

## 2015-01-28 MED ORDER — AMLODIPINE BESYLATE 5 MG PO TABS
5.0000 mg | ORAL_TABLET | Freq: Two times a day (BID) | ORAL | Status: DC
  Administered 2015-01-28 – 2015-01-29 (×3): 5 mg via ORAL
  Filled 2015-01-28 (×3): qty 1

## 2015-01-28 MED ORDER — ESMOLOL HCL 100 MG/10ML IV SOLN
INTRAVENOUS | Status: AC
Start: 2015-01-28 — End: 2015-01-28
  Filled 2015-01-28: qty 10

## 2015-01-28 MED ORDER — ACETAMINOPHEN 650 MG RE SUPP: 650.0000 mg | RECTAL | Status: DC | PRN

## 2015-01-28 MED ORDER — HYDROMORPHONE HCL 1 MG/ML IJ SOLN
INTRAMUSCULAR | Status: AC
  Filled 2015-01-28: qty 1

## 2015-01-28 MED ORDER — DIAZEPAM 5 MG PO TABS
ORAL_TABLET | ORAL | Status: AC
  Filled 2015-01-28: qty 1

## 2015-01-28 MED ORDER — MELATONIN 3 MG PO TABS
1.0000 | ORAL_TABLET | Freq: Every day | ORAL | Status: DC
  Administered 2015-01-28 – 2015-01-29 (×2): 3 mg via ORAL
  Filled 2015-01-28 (×3): qty 1

## 2015-01-28 MED ORDER — THROMBIN 20000 UNITS EX SOLR
CUTANEOUS | Status: AC
  Filled 2015-01-28: qty 20000

## 2015-01-28 MED ORDER — PHENYLEPHRINE 40 MCG/ML (10ML) SYRINGE FOR IV PUSH (FOR BLOOD PRESSURE SUPPORT)
PREFILLED_SYRINGE | INTRAVENOUS | Status: AC
  Filled 2015-01-28: qty 10

## 2015-01-28 MED ORDER — METOPROLOL TARTRATE 1 MG/ML IV SOLN
INTRAVENOUS | Status: DC | PRN
  Administered 2015-01-28: 1 mg via INTRAVENOUS
  Administered 2015-01-28 (×2): 2 mg via INTRAVENOUS

## 2015-01-28 MED ORDER — ONDANSETRON HCL 4 MG/2ML IJ SOLN
INTRAMUSCULAR | Status: AC
  Filled 2015-01-28: qty 2

## 2015-01-28 MED ORDER — OXYCODONE-ACETAMINOPHEN 5-325 MG PO TABS
1.0000 | ORAL_TABLET | ORAL | Status: DC | PRN
  Administered 2015-01-29 – 2015-01-30 (×3): 2 via ORAL
  Filled 2015-01-28 (×3): qty 2

## 2015-01-28 MED ORDER — DIAZEPAM 5 MG PO TABS
5.0000 mg | ORAL_TABLET | Freq: Four times a day (QID) | ORAL | Status: DC | PRN
  Administered 2015-01-28 – 2015-01-29 (×2): 5 mg via ORAL
  Filled 2015-01-28: qty 1

## 2015-01-28 MED ORDER — LIDOCAINE HCL (CARDIAC) 20 MG/ML IV SOLN
INTRAVENOUS | Status: AC
Start: 2015-01-28 — End: 2015-01-28
  Filled 2015-01-28: qty 5

## 2015-01-28 MED ORDER — MUPIROCIN 2 % EX OINT
TOPICAL_OINTMENT | CUTANEOUS | Status: AC
  Filled 2015-01-28: qty 22

## 2015-01-28 MED ORDER — FLEET ENEMA 7-19 GM/118ML RE ENEM: 1.0000 | ENEMA | Freq: Once | RECTAL | Status: DC | PRN

## 2015-01-28 MED ORDER — PHENYLEPHRINE HCL 10 MG/ML IJ SOLN
10.0000 mg | INTRAVENOUS | Status: DC | PRN
  Administered 2015-01-28: 15 ug/min via INTRAVENOUS

## 2015-01-28 MED ORDER — LIDOCAINE HCL (CARDIAC) 20 MG/ML IV SOLN
INTRAVENOUS | Status: DC | PRN
  Administered 2015-01-28: 60 mg via INTRAVENOUS

## 2015-01-28 MED ORDER — SENNOSIDES-DOCUSATE SODIUM 8.6-50 MG PO TABS: 1.0000 | ORAL_TABLET | Freq: Every evening | ORAL | Status: DC | PRN

## 2015-01-28 MED ORDER — BISACODYL 5 MG PO TBEC: 5.0000 mg | DELAYED_RELEASE_TABLET | Freq: Every day | ORAL | Status: DC | PRN

## 2015-01-28 MED ORDER — LACTATED RINGERS IV SOLN
INTRAVENOUS | Status: DC | PRN
  Administered 2015-01-28 (×2): via INTRAVENOUS

## 2015-01-28 MED ORDER — PROPOFOL 10 MG/ML IV BOLUS
INTRAVENOUS | Status: DC | PRN
  Administered 2015-01-28 (×2): 30 mg via INTRAVENOUS
  Administered 2015-01-28: 60 mg via INTRAVENOUS
  Administered 2015-01-28: 40 mg via INTRAVENOUS

## 2015-01-28 MED ORDER — GABAPENTIN 300 MG PO CAPS
300.0000 mg | ORAL_CAPSULE | Freq: Two times a day (BID) | ORAL | Status: DC
  Administered 2015-01-28 – 2015-01-29 (×3): 300 mg via ORAL
  Filled 2015-01-28 (×3): qty 1

## 2015-01-28 MED ORDER — ALBUTEROL SULFATE HFA 108 (90 BASE) MCG/ACT IN AERS: 2.0000 | INHALATION_SPRAY | RESPIRATORY_TRACT | Status: DC | PRN

## 2015-01-28 MED ORDER — FAMOTIDINE 20 MG PO TABS
20.0000 mg | ORAL_TABLET | Freq: Every day | ORAL | Status: DC
  Administered 2015-01-28 – 2015-01-29 (×2): 20 mg via ORAL
  Filled 2015-01-28 (×2): qty 1

## 2015-01-28 MED ORDER — SODIUM CHLORIDE 0.9 % IJ SOLN
3.0000 mL | INTRAMUSCULAR | Status: DC | PRN
Start: 2015-01-28 — End: 2015-01-28

## 2015-01-28 MED ORDER — DOCUSATE SODIUM 100 MG PO CAPS
100.0000 mg | ORAL_CAPSULE | Freq: Two times a day (BID) | ORAL | Status: DC
  Administered 2015-01-28 – 2015-01-29 (×3): 100 mg via ORAL
  Filled 2015-01-28 (×3): qty 1

## 2015-01-28 MED ORDER — BUPIVACAINE-EPINEPHRINE (PF) 0.25% -1:200000 IJ SOLN
INTRAMUSCULAR | Status: AC
  Filled 2015-01-28: qty 30

## 2015-01-28 MED ORDER — PHENOL 1.4 % MT LIQD: 1.0000 | OROMUCOSAL | Status: DC | PRN

## 2015-01-28 MED ORDER — BACITRACIN ZINC 500 UNIT/GM EX OINT
TOPICAL_OINTMENT | CUTANEOUS | Status: DC | PRN
  Administered 2015-01-28: 1 via TOPICAL

## 2015-01-28 MED ORDER — ALUM & MAG HYDROXIDE-SIMETH 200-200-20 MG/5ML PO SUSP: 30.0000 mL | Freq: Four times a day (QID) | ORAL | Status: DC | PRN

## 2015-01-28 MED ORDER — MORPHINE SULFATE (PF) 2 MG/ML IV SOLN: 1.0000 mg | INTRAVENOUS | Status: DC | PRN

## 2015-01-28 MED ORDER — ARTIFICIAL TEARS OP OINT
TOPICAL_OINTMENT | OPHTHALMIC | Status: AC
  Filled 2015-01-28: qty 3.5

## 2015-01-28 MED ORDER — THROMBIN 20000 UNITS EX SOLR
CUTANEOUS | Status: DC | PRN
  Administered 2015-01-28: 20000 mL via TOPICAL

## 2015-01-28 MED ORDER — MENTHOL 3 MG MT LOZG: 1.0000 | LOZENGE | OROMUCOSAL | Status: DC | PRN

## 2015-01-28 MED ORDER — TRAZODONE HCL 50 MG PO TABS
50.0000 mg | ORAL_TABLET | Freq: Every day | ORAL | Status: DC
Start: 2015-01-28 — End: 2015-01-30
  Administered 2015-01-28 – 2015-01-29 (×2): 50 mg via ORAL
  Filled 2015-01-28 (×2): qty 1

## 2015-01-28 MED ORDER — ATENOLOL 25 MG PO TABS
12.5000 mg | ORAL_TABLET | Freq: Every day | ORAL | Status: DC
Start: 2015-01-29 — End: 2015-01-30
  Administered 2015-01-29: 12.5 mg via ORAL
  Filled 2015-01-28: qty 1

## 2015-01-28 MED ORDER — FENTANYL CITRATE (PF) 100 MCG/2ML IJ SOLN
INTRAMUSCULAR | Status: DC | PRN
  Administered 2015-01-28: 100 ug via INTRAVENOUS
  Administered 2015-01-28 (×4): 50 ug via INTRAVENOUS

## 2015-01-28 MED ORDER — VITAMIN D 1000 UNITS PO TABS
2000.0000 [IU] | ORAL_TABLET | Freq: Every day | ORAL | Status: DC
  Administered 2015-01-28 – 2015-01-29 (×2): 2000 [IU] via ORAL
  Filled 2015-01-28 (×2): qty 2

## 2015-01-28 MED ORDER — HYDROCHLOROTHIAZIDE 25 MG PO TABS
25.0000 mg | ORAL_TABLET | Freq: Every day | ORAL | Status: DC
  Administered 2015-01-29: 25 mg via ORAL
  Filled 2015-01-28: qty 1

## 2015-01-28 SURGICAL SUPPLY — 84 items
APL SKNCLS STERI-STRIP NONHPOA (GAUZE/BANDAGES/DRESSINGS) ×1
BENZOIN TINCTURE PRP APPL 2/3 (GAUZE/BANDAGES/DRESSINGS) ×4 IMPLANT
BIT DRILL MOUNTAINEER FIX 14 (BIT) ×1
BIT DRILL MOUNTAINEER FIX 14MM (BIT) IMPLANT
BIT DRILL MOUNTAINER FXED 12MM (DRILL) IMPLANT
BIT DRILL NEURO 2X3.1 SFT TUCH (MISCELLANEOUS) IMPLANT
BLADE CLIPPER SURG NEURO (BLADE) IMPLANT
BUR NEURO DRILL SOFT 3.0X3.8M (BURR) ×3 IMPLANT
BUR PRESCISION 1.7 ELITE (BURR) ×3 IMPLANT
CLOSURE WOUND 1/2 X4 (GAUZE/BANDAGES/DRESSINGS) ×1
CONNECTOR MTN HEAD TO HEAD 35M (Orthopedic Implant) ×2 IMPLANT
CONT SPEC STER OR (MISCELLANEOUS) IMPLANT
CORDS BIPOLAR (ELECTRODE) ×3 IMPLANT
COVER SURGICAL LIGHT HANDLE (MISCELLANEOUS) ×3 IMPLANT
DRAIN CHANNEL 10F 3/8 F FF (DRAIN) IMPLANT
DRAIN CHANNEL 15F RND FF W/TCR (WOUND CARE) IMPLANT
DRAIN HEMOVAC 1/8 X 5 (WOUND CARE) IMPLANT
DRAPE INCISE IOBAN 66X45 STRL (DRAPES) ×3 IMPLANT
DRAPE POUCH INSTRU U-SHP 10X18 (DRAPES) ×3 IMPLANT
DRAPE PROXIMA HALF (DRAPES) ×6 IMPLANT
DRAPE SURG 17X23 STRL (DRAPES) ×24 IMPLANT
DRAPE TABLE COVER HEAVY DUTY (DRAPES) ×3 IMPLANT
DRILL BIT MOUNTAINEER FIX 14MM (BIT) ×3
DRILL MOUNTAINEER FIXED 12MM (DRILL) ×3
DRILL NEURO 2X3.1 SOFT TOUCH (MISCELLANEOUS) ×3
DURAPREP 26ML APPLICATOR (WOUND CARE) ×3 IMPLANT
ELECT BLADE 4.0 EZ CLEAN MEGAD (MISCELLANEOUS) ×3
ELECT CAUTERY BLADE 6.4 (BLADE) ×3 IMPLANT
ELECT REM PT RETURN 9FT ADLT (ELECTROSURGICAL) ×3
ELECTRODE BLDE 4.0 EZ CLN MEGD (MISCELLANEOUS) ×1 IMPLANT
ELECTRODE REM PT RTRN 9FT ADLT (ELECTROSURGICAL) ×1 IMPLANT
EVACUATOR SILICONE 100CC (DRAIN) ×3 IMPLANT
FEE INTRAOP MONITOR IMPULS NCS (MISCELLANEOUS) IMPLANT
GAUZE SPONGE 4X4 12PLY STRL (GAUZE/BANDAGES/DRESSINGS) ×3 IMPLANT
GAUZE SPONGE 4X4 16PLY XRAY LF (GAUZE/BANDAGES/DRESSINGS) ×12 IMPLANT
GLOVE BIO SURGEON STRL SZ7 (GLOVE) ×7 IMPLANT
GLOVE BIO SURGEON STRL SZ8 (GLOVE) ×5 IMPLANT
GLOVE BIOGEL PI IND STRL 8 (GLOVE) ×1 IMPLANT
GLOVE BIOGEL PI INDICATOR 8 (GLOVE) ×4
GOWN STRL REUS W/ TWL LRG LVL3 (GOWN DISPOSABLE) ×5 IMPLANT
GOWN STRL REUS W/TWL LRG LVL3 (GOWN DISPOSABLE) ×12
INTRAOP MONITOR FEE IMPULS NCS (MISCELLANEOUS) ×1
INTRAOP MONITOR FEE IMPULSE (MISCELLANEOUS) ×2
IV CATH 14GX2 1/4 (CATHETERS) ×3 IMPLANT
KIT BASIN OR (CUSTOM PROCEDURE TRAY) ×3 IMPLANT
KIT ROOM TURNOVER OR (KITS) ×3 IMPLANT
NDL HYPO 25GX1X1/2 BEV (NEEDLE) ×1 IMPLANT
NEEDLE 22X1 1/2 (OR ONLY) (NEEDLE) ×3 IMPLANT
NEEDLE HYPO 25GX1X1/2 BEV (NEEDLE) ×3 IMPLANT
NS IRRIG 1000ML POUR BTL (IV SOLUTION) ×3 IMPLANT
NUT HH X CONN OUTER (Orthopedic Implant) ×4 IMPLANT
PACK LAMINECTOMY ORTHO (CUSTOM PROCEDURE TRAY) ×3 IMPLANT
PAD ARMBOARD 7.5X6 YLW CONV (MISCELLANEOUS) ×6 IMPLANT
PATTIES SURGICAL .5 X.5 (GAUZE/BANDAGES/DRESSINGS) ×3 IMPLANT
PATTIES SURGICAL .5 X1 (DISPOSABLE) ×3 IMPLANT
PIN MAYFIELD SKULL DISP (PIN) ×3 IMPLANT
PUTTY BONE DBX 5CC MIX (Putty) ×2 IMPLANT
PUTTY DBX 1CC (Putty) ×3 IMPLANT
PUTTY DBX 1CC DEPUY (Putty) IMPLANT
ROD MOUTAINEER 3.5X200MM (Rod) ×2 IMPLANT
SCREW F A 3.5X14 (Screw) ×12 IMPLANT
SCREW FA MOUNTAINEER 22MM (Screw) ×4 IMPLANT
SCREW HH X CONN INNER (Screw) ×4 IMPLANT
SCREW INNER (Screw) ×20 IMPLANT
SCREW MOUNTAINEER 3.5X24MM (Screw) ×4 IMPLANT
SPONGE INTESTINAL PEANUT (DISPOSABLE) IMPLANT
SPONGE SURGIFOAM ABS GEL 100 (HEMOSTASIS) ×3 IMPLANT
STRIP CLOSURE SKIN 1/2X4 (GAUZE/BANDAGES/DRESSINGS) ×1 IMPLANT
SURGIFLO W/THROMBIN 8M KIT (HEMOSTASIS) IMPLANT
SUT MNCRL AB 4-0 PS2 18 (SUTURE) ×3 IMPLANT
SUT VIC AB 0 CT1 18XCR BRD 8 (SUTURE) ×1 IMPLANT
SUT VIC AB 0 CT1 8-18 (SUTURE) ×3
SUT VIC AB 1 CT1 18XCR BRD 8 (SUTURE) ×2 IMPLANT
SUT VIC AB 1 CT1 8-18 (SUTURE) ×6
SUT VIC AB 2-0 CT2 18 VCP726D (SUTURE) ×6 IMPLANT
SYR BULB IRRIGATION 50ML (SYRINGE) ×3 IMPLANT
SYR CONTROL 10ML LL (SYRINGE) ×9 IMPLANT
TAPE CLOTH 4X10 WHT NS (GAUZE/BANDAGES/DRESSINGS) ×3 IMPLANT
TAPE CLOTH SURG 4X10 WHT LF (GAUZE/BANDAGES/DRESSINGS) ×2 IMPLANT
TOWEL OR 17X24 6PK STRL BLUE (TOWEL DISPOSABLE) ×3 IMPLANT
TOWEL OR 17X26 10 PK STRL BLUE (TOWEL DISPOSABLE) ×3 IMPLANT
TRAY FOLEY CATH 16FRSI W/METER (SET/KITS/TRAYS/PACK) ×3 IMPLANT
WATER STERILE IRR 1000ML POUR (IV SOLUTION) ×3 IMPLANT
YANKAUER SUCT BULB TIP NO VENT (SUCTIONS) ×3 IMPLANT

## 2015-01-28 NOTE — Transfer of Care (Signed)
Immediate Anesthesia Transfer of Care Note  Patient: Misty Patterson  Procedure(s) Performed: Procedure(s) with comments: POSTERIOR CERVICAL DECOMPRESSION FUSION LEVEL 5 WITH INSTRUMENTATION AND ALLOGRAFT (N/A) - Cervical posterior decompression fusion, cervical 3-4, cervical 4-5, cervical 5-6, cervical 6-7, cervical 6-thoracic 1 with instrumentation and allograft.  Patient Location: PACU  Anesthesia Type:General  Level of Consciousness: awake, alert , oriented and patient cooperative  Airway & Oxygen Therapy: Patient Spontanous Breathing and Patient connected to nasal cannula oxygen  Post-op Assessment: Report given to RN, Post -op Vital signs reviewed and stable and Patient moving all extremities X 4  Post vital signs: Reviewed and stable  Last Vitals:  Filed Vitals:   01/28/15 0612  BP: 177/49  Pulse: 51  Temp: 36.4 C  Resp: 20    Complications: No apparent anesthesia complications

## 2015-01-28 NOTE — Anesthesia Procedure Notes (Signed)
Procedure Name: Intubation Date/Time: 01/28/2015 7:44 AM Performed by: Salli Quarry Iisha Soyars Pre-anesthesia Checklist: Patient identified, Emergency Drugs available, Suction available and Patient being monitored Patient Re-evaluated:Patient Re-evaluated prior to inductionOxygen Delivery Method: Circle system utilized Preoxygenation: Pre-oxygenation with 100% oxygen Intubation Type: IV induction Ventilation: Mask ventilation without difficulty Laryngoscope Size: Glidescope and 3 Tube type: Oral Tube size: 7.5 mm Number of attempts: 1 Airway Equipment and Method: Stylet and Video-laryngoscopy Placement Confirmation: ETT inserted through vocal cords under direct vision,  CO2 detector and breath sounds checked- equal and bilateral Secured at: 21 cm Tube secured with: Tape Dental Injury: Teeth and Oropharynx as per pre-operative assessment

## 2015-01-28 NOTE — Anesthesia Preprocedure Evaluation (Addendum)
Anesthesia Evaluation  Patient identified by MRN, date of birth, ID band Patient awake    Reviewed: Allergy & Precautions, H&P , NPO status , Patient's Chart, lab work & pertinent test results, reviewed documented beta blocker date and time   Airway Mallampati: II  TM Distance: >3 FB Neck ROM: Full    Dental no notable dental hx. (+) Edentulous Upper, Edentulous Lower, Dental Advisory Given   Pulmonary COPD,  COPD inhaler,    Pulmonary exam normal breath sounds clear to auscultation       Cardiovascular hypertension, Pt. on medications and Pt. on home beta blockers + Peripheral Vascular Disease   Rhythm:Regular Rate:Normal     Neuro/Psych Anxiety Depression Dementia negative neurological ROS     GI/Hepatic Neg liver ROS, GERD  Medicated and Controlled,  Endo/Other  negative endocrine ROS  Renal/GU Renal InsufficiencyRenal disease  negative genitourinary   Musculoskeletal  (+) Arthritis , Osteoarthritis,    Abdominal   Peds  Hematology negative hematology ROS (+)   Anesthesia Other Findings   Reproductive/Obstetrics negative OB ROS                           Anesthesia Physical Anesthesia Plan  ASA: III  Anesthesia Plan: General   Post-op Pain Management:    Induction: Intravenous  Airway Management Planned: Oral ETT  Additional Equipment: Arterial line  Intra-op Plan:   Post-operative Plan: Extubation in OR  Informed Consent: I have reviewed the patients History and Physical, chart, labs and discussed the procedure including the risks, benefits and alternatives for the proposed anesthesia with the patient or authorized representative who has indicated his/her understanding and acceptance.   Dental advisory given  Plan Discussed with: CRNA  Anesthesia Plan Comments:        Anesthesia Quick Evaluation

## 2015-01-28 NOTE — Progress Notes (Signed)
Patient found walking unsteadily outside in hallway with blood dripping from her   disconnected IV line on her right AC. Patient noted to be a little confused "I thought somebody is knocking outside my door" per patient. Able to state name and place but with noted impaired judgement. Also noted foley got pulled out d/t pt getting out of bed unassisted and catheter left hanging on bed while pt is outside. No noted bleeding from genitals nor c/o pain so far. Patient also have incontinence of stool during the incident . Cleaned and assisted pt back to bed. Reoriented pt  About use of call light when needing help. Noted patient not having full understanding about use of call light- needs reinforcement. Will request Air cabin crew.

## 2015-01-28 NOTE — Progress Notes (Signed)
Transferred pt to [facility] per MD order. Patient's son  aware about previous incident and the reason for transfer and verbalized understanding.

## 2015-01-28 NOTE — Op Note (Signed)
NAMEBETHANIA, SHONE                ACCOUNT NO.:  [id number]  MEDICAL RECORD NO.:  [id number]  LOCATION:  [facility]                         FACILITY:  [facility]  PHYSICIAN:  Phylliss Bob, MD      DATE OF BIRTH:  07-25-1926  DATE OF PROCEDURE:  01/28/2015                              OPERATIVE REPORT   PREOPERATIVE DIAGNOSES: 1. Progressive cervical myelopathy. 2. Severe cervical spinal cord compression. 3. C7-T1 spondylolisthesis  POSTOPERATIVE DIAGNOSES: 1. Progressive cervical myelopathy. 2. Severe cervical spinal cord compression. 3. C7-T1 spondylolisthesis  PROCEDURE: 1. Cervical laminectomy with decompression of the spinal cord, C3-C7. 2. Posterior spinal fusion, C3-4, C4-5, C5-6, C6-7, C7-T1. 3. Placement of posterior segmental instrumentation, C3-T1. 4. Use of local autograft. 5. Use of morselized allograft. 6. Intraoperative use of fluoroscopy. 7. Cranial tong application and removal. 8. C6-7 bilateral laminotomy with partial facetectomy and     foraminotomy.  SURGEON:  Phylliss Bob, MD  ASSISTANT:  Pricilla Holm, PA-C.  ANESTHESIA:  General endotracheal anesthesia.  COMPLICATIONS:  None.  DISPOSITION:  Stable.  ESTIMATED BLOOD LOSS:  200 mL.  INDICATIONS FOR SURGERY:  Briefly, Ms. Kowall is a very pleasant 81 year old female, who recently had been having a progressive deterioration in her balance.  An MRI did reveal spinal cord compression with substantial myelomalacia in the spinal cord, which I did feel was readily correlating to the patient's progressive symptomatology.  Given the patient's substantial pathology noted on her cervical MRI, in addition to her obvious symptoms and physical exam findings consistent with cervical myelopathy, we did discuss proceeding with the procedure reflected above.  The patient did fully understand the risks and limitations of the procedure as outlined in my preoperative note.  OPERATIVE DETAILS:  On January 28, 2015, the  patient was brought to surgery and after general endotracheal anesthesia was administered, I did place a head holder in its appropriate position.  The patient was then gently rolled prone onto the operating room table.  The head was secured to the head holder using the attachment.  Of note, I did use neurologic monitoring throughout the surgery.  I did obtain baseline motor-evoked potentials prior to placing the patient into the operative position.  There was no change from the patient's baseline motor-evoked potentials.  The patient's arms were then secured to her sides and the head was placed into the appropriate position.  The head was gently flexed.  I again obtained motor-evoked potentials, and there was no change from baseline.  The neck was then prepped and draped in the usual sterile fashion.  Time-out procedure was performed.  I then made a midline incision from approximately C3 to approximately T2.  The fascia was incised at the midline.  The paraspinal musculature was bluntly swept laterally and self-retaining retractors were placed.  I then liberally exposed the facet joints from C3-4 down to C7-T1.  I used a 1.7-mm bur to decorticate the facet joints to be fused.  Then, using anatomic landmarks, I did cannulate the C3, C4, and C5 pedicles bilaterally.  A 3-mm tap was used.  A ball-tip probe was used to confirm that there was no cortical violation of the cannulated lateral masses. Then,  I performed a bilateral laminotomy and partial facetectomy with foraminotomy at the C6-7 levels bilaterally.  This did allow me to decompress the exiting C7 nerves, and I was also able to palpate the superior and medial border of the C7 pedicles bilaterally.  Using these landmarks, I was able to cannulate the C7 pedicles bilaterally.  I did use a 3.5-mm tap and did tap to a 22-mm depth.  Bone wax was placed in the cannulated pedicles.  Then, at the T1 level, I did using anatomic landmarks to  cannulate the T1 pedicles bilaterally.  I did advance the tap to 24 mm.  Again, bone wax was placed in the cannulated pedicle holes.  At this point, I proceeded with the laminectomy.  I did use a 2- mm bur to create troughs on the right and on the left sides.  The lamina of C4, C5, as well as C6 was then removed en bloc.  The width of the laminectomy was 14 mm.  I then again ran motor evoked potentials, there was no change from baseline.  By performing the laminectomy noted above, I was able to fully decompress the spine from C3-4 down to the C6-7 level.  No durotomy was encountered.  I then used a 3-mm bur to decorticate the posterior elements of the levels to be fused.  Allograft was then packed into the facet joints and along the posterior elements from C3 to T1.  I then advanced a 4 x 22 mm screws at C7 as well as 4 x 24 mm screws at T1 and 3.5 x 14 mm screws were placed bilaterally from C3 and C5.  Rods were then secured into the tulip heads of the screws bilaterally, after contouring the rods into the appropriate degree of lordosis.  Of note, the neck was extended prior to placing the rods. Caps were then placed.  A cross-link was then placed over the C7 caps. A final locking maneuver was then performed along all the caps and along the cross-link at the C7 level.  Of note, the wound was copiously irrigated prior to placing the allograft and autograft.  Motor-evoked potentials were again performed, and there was no change from baseline. I was very pleased with the final construct.  A lateral intraoperative radiograph did confirm the appropriate operative levels at the termination of the procedure.  The wound was then closed in layers using #1 Vicryl followed by 0 Vicryl, followed by 2-0 Vicryl, followed by 3-0 Monocryl.  Benzoin and Steri-Strips were then applied followed by sterile dressing.  All instrument counts were correct at the termination of the procedure.  Of note, Pricilla Holm was my assistant throughout the surgery from start to finish, and did aid in retraction, suctioning, and closure.     Phylliss Bob, MD     MD/MEDQ  D:  01/28/2015  T:  01/28/2015  Job:  [id number]  cc:   Wenda Low, MD

## 2015-01-28 NOTE — Anesthesia Postprocedure Evaluation (Signed)
  Anesthesia Post-op Note  Patient: Misty Patterson  Procedure(s) Performed: Procedure(s) with comments: POSTERIOR CERVICAL DECOMPRESSION FUSION LEVEL 5 WITH INSTRUMENTATION AND ALLOGRAFT (N/A) - Cervical posterior decompression fusion, cervical 3-4, cervical 4-5, cervical 5-6, cervical 6-7, cervical 6-thoracic 1 with instrumentation and allograft.  Patient Location: PACU  Anesthesia Type:General  Level of Consciousness: awake and alert   Airway and Oxygen Therapy: Patient Spontanous Breathing  Post-op Pain: Controlled  Post-op Assessment: Post-op Vital signs reviewed, Patient's Cardiovascular Status Stable and Respiratory Function Stable  Post-op Vital Signs: Reviewed  Filed Vitals:   01/28/15 1430  BP: 112/51  Pulse: 66  Temp:   Resp: 8    Complications: No apparent anesthesia complications

## 2015-01-29 ENCOUNTER — Encounter (HOSPITAL_COMMUNITY): Payer: Self-pay | Admitting: Orthopedic Surgery

## 2015-01-29 NOTE — Progress Notes (Signed)
Patient had discharge home orders placed this morning. Writer spoke with children about discharge and were not agreeable to discharge with safety concerns. MD notified and Lennie Muckle McKenzie, PA-C spoke with son and agreed for patient to stay another day. Unit DON aware.

## 2015-01-29 NOTE — Progress Notes (Signed)
    Patient doing well, pain in arm resolved, residual numbness, feels good overall, expected neck pain, has not eaten or drank anything yet, mildly sedated  Physical Exam: BP 129/47 mmHg  Pulse 66  Temp(Src) 98 F (36.7 C) (Oral)  Resp 18  Ht 5' 5.5" (1.664 m)  Wt 65.59 kg (144 lb 9.6 oz)  BMI 23.69 kg/m2  SpO2 96%  Dressing in place, hard collar worn appropriately, neck soft and supple, pt laying in bed comfortably, NVI  POD #1 s/p C3-T1 Posterior Cervical Fusion for myelopathy   - walk pt in hallway, encourage ambulation - Percocet for pain, Valium for muscle spasms as needed only - Encourage food and drink  - likely d/c home today after eat/drink/ambulate  -Pt reports she has a ride to come pick her up

## 2015-01-29 NOTE — Progress Notes (Signed)
Pt arrived on unit 2140 hrs, A&O, Dried blood noted to both sides of head just in front of ear just above hairline, no obvious injury was located, Pt denied any injury or pain, tech accompanying Pt advises that Pt did have blood all around these areas after removing her IV and it was likely missed on clean up. Pt arrived via W/C and was able to stand and pivot to bed with minimal assist, Pt oriented to room and equipment, no current complaints.

## 2015-01-29 NOTE — Progress Notes (Signed)
Orthopedic Tech Progress Note Patient Details:  Misty Patterson May 09, 1926 [id number]  Ortho Devices Type of Ortho Device: [place] cervical collar Ortho Device/Splint Location: neck Ortho Device/Splint Interventions: Loanne Drilling, Krysti Hickling 01/29/2015, 9:04 AM

## 2015-01-29 NOTE — Care Management Note (Addendum)
Case Management Note  Patient Details  Name: MIE TRUSCOTT MRN: [id number] Date of Birth: 01-Jun-1926  Subjective/Objective:                    Action/Plan: Patient was admitted for a multi-level PCDF. CM left message for Dr Laurena Bering PA, [place], to inquire about whether PT/OT is wanted while patient is admitted.  Awaiting return call.  Will continue to follow for discharge needs. , PA Addendum: Received a return call from [place], [place] for Dr Lynann Bologna. No PT/OT orders will be placed while patient is admitted. Plan is to discharge home tomorrow.  Expected Discharge Date:                  Expected Discharge Plan:  Home/Self Care  In-House Referral:     Discharge planning Services     Post Acute Care Choice:    Choice offered to:     DME Arranged:    DME Agency:     HH Arranged:    HH Agency:     Status of Service:  In process, will continue to follow  Medicare Important Message Given:    Date Medicare IM Given:    Medicare IM give by:    Date Additional Medicare IM Given:    Additional Medicare Important Message give by:     If discussed at [place] of Stay Meetings, dates discussed:    Additional Comments:  Rolm Baptise, RN 01/29/2015, 1:43 PM

## 2015-01-30 NOTE — Discharge Instructions (Signed)
Cervical Fusion  The neck is the upper portion of your spine. The 7 bones in your neck are referred to as the cervical spine. Cervical fusion is a type of surgery that is done on the cervical spine to relieve pressure on the spinal cord or one or more nerve roots. There are two types of cervical fusion:  · Anterior cervical fusion. This surgery is done through the front (anterior) part of your neck. During the surgery the affected intervertebral disk is removed to take pressure off the nerves or spinal cord. The area where the disc was removed is filled with a bone graft that causes the vertebral bodies to grow together (fuse) over time.  · Posterior cervical fusion. This surgery is done through the back (posterior) of the neck. The surgery joins two or more neck vertebrae into one solid section of bone. Posterior cervical fusion is most commonly used to treat neck fractures and dislocations and to fix deformities in the curve of the neck.  LET YOUR HEALTH CARE PROVIDER KNOW ABOUT:  · Any allergies you have.  · All medicines you are taking, including vitamins, herbs, eyedrops, creams, and over-the-counter medicines.  · Previous problems you or members of your family have had with the use of anesthetics.  · Any blood disorders or blood clotting problems you have.  · Previous surgeries you have had.  · Medical conditions you have.  RISKS AND COMPLICATIONS  Generally, this is a safe procedure. However, as with any procedure, problems can occur. Possible problems include:   · Infection.    · Bleeding with possible need for blood transfusion.    · Injury to surrounding structures, including nerves.    · Leakage of cerebrospinal fluid.    · Blood clots.  · Temporary breathing difficulties after surgery.  · Extended hospital stay, especially with posterior cervical fusion.  BEFORE THE PROCEDURE  · Do not eat or drink for 6-8 hours before the procedure.    · Take medicines as directed by your surgeon. Ask your surgeon about  changing or stopping your regular medicines.    · You will be given antibiotic medicines to keep the infection rate down.    · The surgical cut (incision) site on your neck will be marked.    · Your neck will be cleaned to reduce the risk of infection.  PROCEDURE   The length of the procedure depends on what needs to be done. It usually takes 2 or more hours. For both procedures, you will be given medicine to make you sleep (general anesthetic). A breathing tube will be placed down your throat.   Anterior Cervical Fusion   · An incision will usually be made in a skin fold line at the front of your neck, in the area where the fusion will be placed.   · The neck muscles will be pushed aside.    · The surgeon will remove the affected, degenerated disk and bone spurs (decompression). This helps to take the pressure off the nerves and spinal cord.    · The area where the disk was removed is then filled with a plastic spacer implant, bone graft, or both. These implants and bone grafts take the place of the disk and keep the nerve passageway open and clear for the nerves and spinal cord.    · In most cases, the surgeon will put metal plates, pins, or screws (hardware) in the neck to help stabilize the surgical site and to keep the implants and bone grafts in place. The hardware reduces motion at the surgical   site, so the bones can grow together. This provides extra support to the neck.    Posterior Cervical Fusion   · An incision will be made through the back of the neck.    · Two or more neck vertebrae will be joined into one solid section of bone.    · Metal plates and pins or screws may be placed in the neck. These help stabilize the neck, providing extra support to help the bones to grow together more easily.  AFTER THE PROCEDURE  · You will stay in a recovery area until the anesthesia has worn off. Your blood pressure and pulse will be checked often.    · You may continue to receive fluids and medicines, such as  antibiotics, through the IV tube for several days after the surgery.    · You may need to wear a neck or back brace for several weeks after surgery, especially when up and out of bed.    · You may be given pain medicine while still in the recovery area. Some pain is normal, but if your pain gets worse, tell your surgeon or nurse.    · Be up and moving as soon as possible after surgery. Physical therapists will help you start walking.    · To prevent blood clots in your legs:  ¨ You may be given compression stockings to wear.    ¨ You may need to take medicine to prevent clots.  · You may be asked to do breathing exercises. This is to prevent a lung infection.    · Most people stay in the hospital for 1-3 days after this surgery.       This information is not intended to replace advice given to you by your health care provider. Make sure you discuss any questions you have with your health care provider.     Document Released: 08/19/2001 Document Revised: 03/04/2013 Document Reviewed: 08/29/2012  Elsevier Interactive Patient Education ©2016 [employer]

## 2015-01-30 NOTE — Discharge Summary (Signed)
Patient ID: Misty Patterson MRN: [id number] DOB/AGE: 1926-07-18 81 y.o.  Admit date: 01/28/2015 Discharge date: 01/30/2015  Admission Diagnoses:  Active Problems:   Myelopathy [facility])   Discharge Diagnoses:  Same  Past Medical History  Diagnosis Date  . Cancer ([place])     ovarian  . Restless leg syndrome   . DVT (deep venous thrombosis) ([facility])     right leg  . Peripheral neuropathy ([place])   . Dyslipidemia     but doesn't take any meds   . Ovarian cancer ([place])   . Osteoporosis     takes Vit D daily  . Incontinence of urine   . Esophageal dysmotility   . Zenker's diverticulum     small  . Decreased GFR   . Chronic kidney disease   . DJD (degenerative joint disease) of cervical spine   . Radiculopathy   . CTS (carpal tunnel syndrome)   . Anxiety     takes Valium daily  . Hypertension     takes Amlodipine and Atenolol daily  . Insomnia     takes Trazodone nightly  . Depression     takes Zoloft daily  . COPD (chronic obstructive pulmonary disease) ([place])     ProAir daily as needed  . GERD (gastroesophageal reflux disease)     takes pepcid daily  . Pneumonia     hx of-15+yrs ago  . Chronic back pain   . Cervical disc disease   . Neuropathy ([place])   . Urinary frequency   . Nocturia   . Peripheral vascular disease ([place])   . Dementia     takes aricept    Surgeries: Procedure(s): POSTERIOR CERVICAL DECOMPRESSION FUSION LEVEL 5 WITH INSTRUMENTATION AND ALLOGRAFT on 01/28/2015   Consultants:    Discharged Condition: Improved  Hospital Course: Misty Patterson is an 81 y.o. female who was admitted 01/28/2015 for operative treatment of<principal problem not specified>. Patient has severe unremitting pain that affects sleep, daily activities, and work/hobbies. After pre-op clearance the patient was taken to the operating room on 01/28/2015 and underwent  Procedure(s): POSTERIOR CERVICAL DECOMPRESSION FUSION LEVEL 5 WITH INSTRUMENTATION AND ALLOGRAFT.    Patient was given  perioperative antibiotics: Anti-infectives    Start     Dose/Rate Route Frequency Ordered Stop   01/28/15 1915  ceFAZolin (ANCEF) IVPB 2 g/50 mL premix     2 g 100 mL/hr over 30 Minutes Intravenous Every 8 hours 01/28/15 1914 01/29/15 0414   01/28/15 0700  ceFAZolin (ANCEF) IVPB 2 g/50 mL premix     2 g 100 mL/hr over 30 Minutes Intravenous To [facility] 01/27/15 1344 01/28/15 0819       Patient was given sequential compression devices, early ambulation, and chemoprophylaxis to prevent DVT.  Patient benefited maximally from hospital stay and there were no complications.    Recent vital signs: Patient Vitals for the past 24 hrs:  BP Temp Temp src Pulse Resp SpO2  01/30/15 0618 (!) 162/67 mmHg 98.7 F (37.1 C) Oral 76 18 95 %  01/30/15 0207 (!) 141/53 mmHg 99.1 F (37.3 C) Oral 70 18 95 %  01/29/15 2130 (!) 155/56 mmHg 99.5 F (37.5 C) Oral 66 19 100 %  01/29/15 1853 (!) 132/56 mmHg 98.4 F (36.9 C) Oral 68 18 97 %     Recent laboratory studies: No results for input(s): WBC, HGB, HCT, PLT, NA, K, CL, CO2, BUN, CREATININE, GLUCOSE, INR, CALCIUM in the last 72 hours.  Invalid input(s): PT, 2  Discharge Medications:     Medication List    TAKE these medications        albuterol 108 (90 BASE) MCG/ACT inhaler  Commonly known as:  PROVENTIL HFA;VENTOLIN HFA  Inhale 2 puffs into the lungs every 4 (four) hours as needed for wheezing or shortness of breath.     amLODipine 5 MG tablet  Commonly known as:  NORVASC  Take 5 mg by mouth 2 (two) times daily.     atenolol 25 MG tablet  Commonly known as:  TENORMIN  Take 12.5 mg by mouth daily.     B-complex with vitamin C tablet  Take 1 tablet by mouth daily.     cholecalciferol 1000 UNITS tablet  Commonly known as:  VITAMIN D  Take 2,000 Units by mouth daily.     diazepam 5 MG tablet  Commonly known as:  VALIUM  Take 5 mg by mouth at bedtime.     donepezil 5 MG tablet  Commonly known as:  ARICEPT  Take 1 tablet  daily for one month, then increase to 2 tablets.     famotidine 20 MG tablet  Commonly known as:  PEPCID  Take 20 mg by mouth daily.     gabapentin 300 MG capsule  Commonly known as:  NEURONTIN  Take 300 mg by mouth 2 (two) times daily.     hydrochlorothiazide 25 MG tablet  Commonly known as:  HYDRODIURIL  Take 25 mg by mouth daily.     Melatonin-Pyridoxine 3-1 MG Tabs  Take 1 tablet by mouth at bedtime.     multivitamin with minerals Tabs tablet  Take 1 tablet by mouth daily.     OVER THE COUNTER MEDICATION  Take 2 capsules by mouth daily. Ultimate Colon Care Formula - herbal supplement     ROLLATOR ULTRA-LIGHT Misc  1 each by Does not apply route daily.     sertraline 50 MG tablet  Commonly known as:  ZOLOFT  Take 50 mg by mouth at bedtime.     sodium chloride 0.65 % Soln nasal spray  Commonly known as:  OCEAN  Place 1 spray into both nostrils at bedtime as needed for congestion.     solifenacin 5 MG tablet  Commonly known as:  VESICARE  Take 5 mg by mouth daily.     traZODone 50 MG tablet  Commonly known as:  DESYREL  Take 50 mg by mouth at bedtime.        Diagnostic Studies: Dg Chest 2 View  01/25/2015  CLINICAL DATA:  Preop spinal fusion EXAM: CHEST  2 VIEW COMPARISON:  04/1914 FINDINGS: There is mild bilateral interstitial prominence likely chronic. There is no focal parenchymal opacity. There is no pleural effusion or pneumothorax. The heart and mediastinal contours are unremarkable. The osseous structures are unremarkable. IMPRESSION: No active cardiopulmonary disease. Electronically Signed   By: Kathreen Devoid   On: 01/25/2015 16:18   Dg Cervical Spine 1 View  01/28/2015  CLINICAL DATA:  Posterior Cervical fusion. EXAM: CERVICAL SPINE 1 VIEW COMPARISON:  CT scan 01/20/2015 FINDINGS: Intraoperative film demonstrates posterior fusion hardware extending from C3 days T1. No complicating features are demonstrated. IMPRESSION: Posterior fusion hardware extending  from C3-T1. Electronically Signed   By: Marijo Sanes M.D.   On: 01/28/2015 12:54   Dg Cervical Spine 1 View  01/28/2015  CLINICAL DATA:  Cervical posterior decompression and fusion. EXAM: CERVICAL SPINE 1 VIEW COMPARISON:  CT 01/20/2015 FINDINGS: Single cross-table lateral view of the cervical  spine was obtained. There is an endotracheal tube and orogastric tube. Surgical marker along the posterior aspect of C5-C6. Multilevel disc disease in the cervical spine. IMPRESSION: Surgical marking at C5-C6. Electronically Signed   By: Markus Daft M.D.   On: 01/28/2015 11:09   Dg Cervical Spine 2 Or 3 Views  01/28/2015  CLINICAL DATA:  Status post posterior neck fusion EXAM: CERVICAL SPINE - 2 VIEW COMPARISON:  01/28/2015 FINDINGS: Postsurgical changes are noted from C3 -T1. Changes are similar to that seen on the intraoperative examination. Degenerative changes are seen. No hardware abnormality is noted. IMPRESSION: Postoperative changes as described. Electronically Signed   By: Inez Catalina M.D.   On: 01/28/2015 14:44   Ct Cervical Spine Wo Contrast  01/20/2015  CLINICAL DATA:  Cervical myelopathy.  Numbness in both hands. EXAM: CT CERVICAL SPINE WITHOUT CONTRAST TECHNIQUE: Multidetector CT imaging of the cervical spine was performed without intravenous contrast. Multiplanar CT image reconstructions were also generated. COMPARISON:  MRIs dated 12/16/2014 and 10/21/2013 and CT myelogram of the cervical spine dated 02/02/2015 FINDINGS: Craniocervical junction:  Normal. C1-2: Slight degenerative changes between the anterior arch of C1 and the odontoid with calcification in the transverse ligament. C2-3:  Moderate right facet arthritis, stable. C3-4 and C4-5: These levels are fused. No focal neural impingement. C5-6: Fairly severe bilateral foraminal stenosis, unchanged. Broad-based disc protrusion with some gas in the spinal canal. This is also unchanged. The spinal cord is compressed and the prior MRI demonstrates  myelopathy. C6-7: Chronic degenerative disc disease with a small broad-based disc bulge without neural impingement. C7-T1: 3 mm spondylolisthesis with chronic disc space narrowing and a broad-based soft disc protrusion with moderate bilateral foraminal stenosis, unchanged. T1-2: Old Schmorl's node and slight compression deformity of the superior endplate of T2. No neural impingement. T2-3:  Negative. T3-4: Old mild compression fracture of the superior endplate of T4, unchanged since 12/15/2004. However, the abnormalities at T2 and T4 are new since 10/21/2013. IMPRESSION: 1. No significant change in the appearance of the cervical spine since the prior MRI dated 12/16/2014. Cervical spinal cord compression at C5-6. 2. Multilevel degenerative disc and joint disease with old compression deformities of the superior endplates of T2 and T4. Electronically Signed   By: Lorriane Shire M.D.   On: 01/20/2015 14:11    Disposition: 03-Skilled Nursing Facility      Discharge Instructions    Call MD / Call 911    Complete by:  As directed   If you experience chest pain or shortness of breath, CALL 911 and be transported to the hospital emergency room.  If you develope a fever above 101 F, pus (white drainage) or increased drainage or redness at the wound, or calf pain, call your surgeon's office.     Constipation Prevention    Complete by:  As directed   Drink plenty of fluids.  Prune juice may be helpful.  You may use a stool softener, such as Colace (over the counter) 100 mg twice a day.  Use MiraLax (over the counter) for constipation as needed.     Diet - low sodium heart healthy    Complete by:  As directed      Increase activity slowly as tolerated    Complete by:  As directed               Signed: Nohemy Koop, Larwance Sachs 01/30/2015, 8:04 AM

## 2015-01-30 NOTE — Progress Notes (Signed)
Subjective: 2 Days Post-Op Procedure(s) (LRB): POSTERIOR CERVICAL DECOMPRESSION FUSION LEVEL 5 WITH INSTRUMENTATION AND ALLOGRAFT (N/A)  Patient is sitting up eating with assistance this morning. She has pain in her shoulders but has full sensory and motor function in her arms.   Activity level: must be in cervical collar Diet tolerance:  ok Voiding:  ok Patient reports pain as moderate.    Objective: Vital signs in last 24 hours: Temp:  [98.4 F (36.9 C)-99.5 F (37.5 C)] 98.7 F (37.1 C) (11/19 0618) Pulse Rate:  [66-76] 76 (11/19 0618) Resp:  [18-19] 18 (11/19 0618) BP: (132-162)/(53-67) 162/67 mmHg (11/19 0618) SpO2:  [95 %-100 %] 95 % (11/19 0618)  Labs: No results for input(s): HGB in the last 72 hours. No results for input(s): WBC, RBC, HCT, PLT in the last 72 hours. No results for input(s): NA, K, CL, CO2, BUN, CREATININE, GLUCOSE, CALCIUM in the last 72 hours. No results for input(s): LABPT, INR in the last 72 hours.  Physical Exam:  Neurologically intact ABD soft Neurovascular intact Sensation intact distally Intact pulses distally Incision: dressing C/D/I and no drainage No cellulitis present Compartment soft  Assessment/Plan:  2 Days Post-Op Procedure(s) (LRB): POSTERIOR CERVICAL DECOMPRESSION FUSION LEVEL 5 WITH INSTRUMENTATION AND ALLOGRAFT (N/A) Discharge home today. Follow up with Dr. Lynann Bologna as scheduled. Continue on current pain meds. Continue with cervical collar as outlined in Discharge paperwork.   Elaisha Zahniser, Larwance Sachs 01/30/2015, 8:01 AM

## 2015-01-30 NOTE — Progress Notes (Signed)
Pt d/c to home by car with family. Assessment stable. Prescriptions given. All questions answered. 

## 2015-02-01 ENCOUNTER — Encounter (HOSPITAL_COMMUNITY): Payer: Self-pay | Admitting: Orthopedic Surgery

## 2015-02-10 ENCOUNTER — Emergency Department (HOSPITAL_COMMUNITY): Payer: PPO

## 2015-02-10 ENCOUNTER — Emergency Department (HOSPITAL_COMMUNITY)
Admission: EM | Admit: 2015-02-10 | Discharge: 2015-02-10 | Disposition: A | Discharge status: Home / Self Care | Payer: PPO | Attending: Emergency Medicine | Admitting: Emergency Medicine

## 2015-02-10 ENCOUNTER — Encounter (HOSPITAL_COMMUNITY): Payer: Self-pay | Admitting: Cardiology

## 2015-02-10 DIAGNOSIS — Z8701 Personal history of pneumonia (recurrent): Secondary | ICD-10-CM | POA: Insufficient documentation

## 2015-02-10 DIAGNOSIS — T148XXA Other injury of unspecified body region, initial encounter: Secondary | ICD-10-CM

## 2015-02-10 DIAGNOSIS — Z8719 Personal history of other diseases of the digestive system: Secondary | ICD-10-CM | POA: Insufficient documentation

## 2015-02-10 DIAGNOSIS — Z23 Encounter for immunization: Secondary | ICD-10-CM | POA: Insufficient documentation

## 2015-02-10 DIAGNOSIS — Y998 Other external cause status: Secondary | ICD-10-CM | POA: Insufficient documentation

## 2015-02-10 DIAGNOSIS — K219 Gastro-esophageal reflux disease without esophagitis: Secondary | ICD-10-CM | POA: Insufficient documentation

## 2015-02-10 DIAGNOSIS — S0091XA Abrasion of unspecified part of head, initial encounter: Secondary | ICD-10-CM | POA: Insufficient documentation

## 2015-02-10 DIAGNOSIS — F329 Major depressive disorder, single episode, unspecified: Secondary | ICD-10-CM | POA: Diagnosis not present

## 2015-02-10 DIAGNOSIS — Y9389 Activity, other specified: Secondary | ICD-10-CM | POA: Diagnosis not present

## 2015-02-10 DIAGNOSIS — Z8543 Personal history of malignant neoplasm of ovary: Secondary | ICD-10-CM | POA: Diagnosis not present

## 2015-02-10 DIAGNOSIS — Z86718 Personal history of other venous thrombosis and embolism: Secondary | ICD-10-CM | POA: Insufficient documentation

## 2015-02-10 DIAGNOSIS — N189 Chronic kidney disease, unspecified: Secondary | ICD-10-CM | POA: Insufficient documentation

## 2015-02-10 DIAGNOSIS — Z8639 Personal history of other endocrine, nutritional and metabolic disease: Secondary | ICD-10-CM | POA: Diagnosis not present

## 2015-02-10 DIAGNOSIS — Z79899 Other long term (current) drug therapy: Secondary | ICD-10-CM | POA: Insufficient documentation

## 2015-02-10 DIAGNOSIS — F419 Anxiety disorder, unspecified: Secondary | ICD-10-CM | POA: Insufficient documentation

## 2015-02-10 DIAGNOSIS — Y9289 Other specified places as the place of occurrence of the external cause: Secondary | ICD-10-CM | POA: Diagnosis not present

## 2015-02-10 DIAGNOSIS — M810 Age-related osteoporosis without current pathological fracture: Secondary | ICD-10-CM | POA: Insufficient documentation

## 2015-02-10 DIAGNOSIS — G4700 Insomnia, unspecified: Secondary | ICD-10-CM | POA: Insufficient documentation

## 2015-02-10 DIAGNOSIS — IMO0002 Reserved for concepts with insufficient information to code with codable children: Secondary | ICD-10-CM

## 2015-02-10 DIAGNOSIS — J449 Chronic obstructive pulmonary disease, unspecified: Secondary | ICD-10-CM | POA: Insufficient documentation

## 2015-02-10 DIAGNOSIS — S0083XA Contusion of other part of head, initial encounter: Secondary | ICD-10-CM | POA: Diagnosis not present

## 2015-02-10 DIAGNOSIS — I129 Hypertensive chronic kidney disease with stage 1 through stage 4 chronic kidney disease, or unspecified chronic kidney disease: Secondary | ICD-10-CM | POA: Diagnosis not present

## 2015-02-10 DIAGNOSIS — W108XXA Fall (on) (from) other stairs and steps, initial encounter: Secondary | ICD-10-CM | POA: Insufficient documentation

## 2015-02-10 DIAGNOSIS — F0390 Unspecified dementia without behavioral disturbance: Secondary | ICD-10-CM | POA: Diagnosis not present

## 2015-02-10 DIAGNOSIS — S61216A Laceration without foreign body of right little finger without damage to nail, initial encounter: Secondary | ICD-10-CM | POA: Diagnosis not present

## 2015-02-10 DIAGNOSIS — W19XXXA Unspecified fall, initial encounter: Secondary | ICD-10-CM

## 2015-02-10 DIAGNOSIS — S0990XA Unspecified injury of head, initial encounter: Secondary | ICD-10-CM | POA: Diagnosis present

## 2015-02-10 MED ORDER — TETANUS-DIPHTH-ACELL PERTUSSIS 5-2.5-18.5 LF-MCG/0.5 IM SUSP
0.5000 mL | Freq: Once | INTRAMUSCULAR | Status: AC
  Administered 2015-02-10: 0.5 mL via INTRAMUSCULAR
  Filled 2015-02-10: qty 0.5

## 2015-02-10 MED ORDER — ACETAMINOPHEN 325 MG PO TABS: 650.0000 mg | ORAL_TABLET | Freq: Once | ORAL | Status: DC

## 2015-02-10 MED ORDER — TRAMADOL HCL 50 MG PO TABS
50.0000 mg | ORAL_TABLET | Freq: Once | ORAL | Status: AC
  Administered 2015-02-10: 50 mg via ORAL
  Filled 2015-02-10: qty 1

## 2015-02-10 NOTE — ED Provider Notes (Signed)
CSN: [id number]     Arrival date & time 02/10/15  1430 History   First MD Initiated Contact with Patient 02/10/15 1625     Chief Complaint  Patient presents with  . Fall     (Consider location/radiation/quality/duration/timing/severity/associated sxs/prior Treatment) HPI   Misty Patterson is a 81 y.o. female with PMH significant for DVT, peripheral neuropathy, dyslipidemia, CKD, DJD, HTN, GERD, chronic back pain, PVD, Dementia, and recent posterior cervical fusion 01/28/15 who presents with fall just PTA and now complaining of mild headache and pain at her right pinky.  Patient was walking her dog down outdoor steps when it jerked her and she fell forward landing on the cement driveway hitting her head.  Denies LOC, dizziness, SOB, N/V, abdominal pain, urinary symptoms.  She is not on blood thinners.    Past Medical History  Diagnosis Date  . Cancer ([place])     ovarian  . Restless leg syndrome   . DVT (deep venous thrombosis) ([facility])     right leg  . Peripheral neuropathy ([place])   . Dyslipidemia     but doesn't take any meds   . Ovarian cancer ([place])   . Osteoporosis     takes Vit D daily  . Incontinence of urine   . Esophageal dysmotility   . Zenker's diverticulum     small  . Decreased GFR   . Chronic kidney disease   . DJD (degenerative joint disease) of cervical spine   . Radiculopathy   . CTS (carpal tunnel syndrome)   . Anxiety     takes Valium daily  . Hypertension     takes Amlodipine and Atenolol daily  . Insomnia     takes Trazodone nightly  . Depression     takes Zoloft daily  . COPD (chronic obstructive pulmonary disease) ([place])     ProAir daily as needed  . GERD (gastroesophageal reflux disease)     takes pepcid daily  . Pneumonia     hx of-15+yrs ago  . Chronic back pain   . Cervical disc disease   . Neuropathy ([place])   . Urinary frequency   . Nocturia   . Peripheral vascular disease ([place])   . Dementia     takes aricept   Past Surgical History    Procedure Laterality Date  . Vein surgery  1950  . Abdominal aortagram  Feb. 20, 2015  . Abdominal aortagram N/A 05/02/2013    Procedure: ABDOMINAL Maxcine Ham;  Surgeon: Elam Dutch, MD;  Location: [facility] CATH LAB;  Service: Cardiovascular;  Laterality: N/A;  . Abdominal hysterectomy    . Tonsillectomy    . Cataract surgery Bilateral   . Colonoscopy    . Back surgery    . Posterior cervical fusion/foraminotomy N/A 01/28/2015    Procedure: POSTERIOR CERVICAL DECOMPRESSION FUSION LEVEL 5 WITH INSTRUMENTATION AND ALLOGRAFT;  Surgeon: Phylliss Bob, MD;  Location: [facility];  Service: Orthopedics;  Laterality: N/A;  Cervical posterior decompression fusion, cervical 3-4, cervical 4-5, cervical 5-6, cervical 6-7, cervical 6-thoracic 1 with instrumentation and allograft.   Family History  Problem Relation Age of Onset  . Hypertension Mother   . Stroke Mother   . Lung cancer Son     Deceased, 9  . Healthy Son   . Other Father     MVA  . Hypertension Brother   . Stroke Brother   . Kidney disease Sister   . Stroke Brother   . Diabetes Brother     DM  .  Stroke Brother   . Cancer Sister     throat   Social History  Substance Use Topics  . Smoking status: Never Smoker   . Smokeless tobacco: Never Used  . Alcohol Use: No   OB History    No data available     Review of Systems All other systems negative unless otherwise stated in HPI    Allergies  Aspirin; Formaldehyde; and Sodium pentobarbital  Home Medications   Prior to Admission medications   Medication Sig Start Date End Date Taking? Authorizing Provider  albuterol (PROVENTIL HFA;VENTOLIN HFA) 108 (90 BASE) MCG/ACT inhaler Inhale 2 puffs into the lungs every 4 (four) hours as needed for wheezing or shortness of breath.    Yes Historical Provider, MD  amLODipine (NORVASC) 5 MG tablet Take 5 mg by mouth 2 (two) times daily.    Yes Historical Provider, MD  atenolol (TENORMIN) 25 MG tablet Take 12.5 mg by mouth daily.   Yes  Historical Provider, MD  B Complex-C (B-COMPLEX WITH VITAMIN C) tablet Take 1 tablet by mouth daily.   Yes Historical Provider, MD  cholecalciferol (VITAMIN D) 1000 UNITS tablet Take 2,000 Units by mouth daily.   Yes Historical Provider, MD  diazepam (VALIUM) 5 MG tablet Take 5 mg by mouth at bedtime.    Yes Historical Provider, MD  donepezil (ARICEPT) 5 MG tablet Take 1 tablet daily for one month, then increase to 2 tablets. Patient taking differently: Take 5 mg by mouth at bedtime.  07/02/14  Yes Donika K Patel, DO  gabapentin (NEURONTIN) 300 MG capsule Take 300 mg by mouth 2 (two) times daily.    Yes Historical Provider, MD  hydrochlorothiazide (HYDRODIURIL) 25 MG tablet Take 25 mg by mouth daily.  02/24/13  Yes Historical Provider, MD  Melatonin-Pyridoxine 3-1 MG TABS Take 1 tablet by mouth at bedtime.   Yes Historical Provider, MD  Multiple Vitamin (MULTIVITAMIN WITH MINERALS) TABS tablet Take 1 tablet by mouth daily.   Yes Historical Provider, MD  OVER THE COUNTER MEDICATION Take 2 capsules by mouth daily. Ultimate Colon Care Formula - herbal supplement   Yes Historical Provider, MD  oxyCODONE-acetaminophen (PERCOCET/ROXICET) 5-325 MG tablet Take 1 tablet by mouth every 4 (four) hours as needed for severe pain.  01/30/15  Yes Historical Provider, MD  sertraline (ZOLOFT) 50 MG tablet Take 50 mg by mouth at bedtime.   Yes Historical Provider, MD  sodium chloride (OCEAN) 0.65 % SOLN nasal spray Place 1 spray into both nostrils at bedtime as needed for congestion.   Yes Historical Provider, MD  solifenacin (VESICARE) 5 MG tablet Take 5 mg by mouth daily.   Yes Historical Provider, MD  traZODone (DESYREL) 50 MG tablet Take 50 mg by mouth at bedtime.   Yes Historical Provider, MD  famotidine (PEPCID) 20 MG tablet Take 20 mg by mouth daily.  06/10/14   Historical Provider, MD  Misc. Devices (ROLLATOR ULTRA-LIGHT) MISC 1 each by Does not apply route daily. 02/20/14   Donika K Patel, DO   BP 160/93  mmHg  Pulse 86  Temp(Src) 97.8 F (36.6 C) (Oral)  Resp 20  Ht 5\' 6"  (1.676 m)  Wt 65.318 kg  BMI 23.25 kg/m2  SpO2 99% Physical Exam  Constitutional: She is oriented to person, place, and time. She appears well-developed and well-nourished. Cervical collar in place.  Cervical collar in place due to recent neck surgery.  HENT:  Head: Normocephalic. Head is with abrasion (and hematoma). Head is without raccoon's  eyes, without Battle's sign and without laceration.    Mouth/Throat: Oropharynx is clear and moist.  Eyes: Conjunctivae are normal. Pupils are equal, round, and reactive to light.  Neck: Normal range of motion. Neck supple.  Cardiovascular: Normal rate, regular rhythm and normal heart sounds.   No murmur heard. Pulmonary/Chest: Effort normal and breath sounds normal. No accessory muscle usage or stridor. No respiratory distress. She has no wheezes. She has no rhonchi. She has no rales.  Abdominal: Soft. Bowel sounds are normal. She exhibits no distension. There is no tenderness.  Musculoskeletal: Normal range of motion.  Lymphadenopathy:    She has no cervical adenopathy.  Neurological: She is alert and oriented to person, place, and time.  Speech clear without dysarthria.  Strength and sensation intact throughout upper and lower extremities.   Skin: Skin is warm and dry. Abrasion and laceration noted.  3-5 mm laceration on the lateral aspect of the right pinky.  No visualization or palpation of foreign bodies.  Minimally tender.   Psychiatric: She has a normal mood and affect. Her behavior is normal.    ED Course  Procedures (including critical care time) Labs Review Labs Reviewed - No data to display  Imaging Review Ct Head Wo Contrast  02/10/2015  CLINICAL DATA:  Tripping injury, fall, head and face injury on concrete. EXAM: CT HEAD WITHOUT CONTRAST CT MAXILLOFACIAL WITHOUT CONTRAST CT CERVICAL SPINE WITHOUT CONTRAST TECHNIQUE: Multidetector CT imaging of the head,  cervical spine, and maxillofacial structures were performed using the standard protocol without intravenous contrast. Multiplanar CT image reconstructions of the cervical spine and maxillofacial structures were also generated. COMPARISON:  01/20/2015, 01/28/2015 FINDINGS: CT HEAD FINDINGS Diffuse brain atrophy and chronic white matter microvascular ischemic changes throughout the cerebral hemispheres. No acute intracranial hemorrhage, definite infarction, mass lesion, midline shift, herniation, hydrocephalus, or extra-axial fluid collection. No focal mass effect or edema. Cisterns are patent. No cerebellar abnormality. Right anterior frontal scalp bruising/ hematoma evident. No underlying skull fracture or osseous abnormality. Atherosclerosis of the intracranial vessels of the skullbase. CT MAXILLOFACIAL FINDINGS Facial bones appear intact. No displaced fracture or asymmetry. Specifically, the mandible, maxilla, skullbase, zygomas, nasal septum, nasal bones, and orbits appear intact. No orbital abnormality or asymmetry. No proptosis. No blowout fracture evident. No significant sinus opacification or disease. Mastoids are clear. Dental implants noted. CT CERVICAL SPINE FINDINGS Patient is status post posterior bilateral fusion spanning C3-T1. Stable alignment and multilevel degenerative changes most pronounced from C3-C7. Intact odontoid. Normal prevertebral soft tissues. No malalignment or definite acute osseous finding. Posterior laminectomies at C4, C5, and C6. Laminotomy defect at C6-7. Scattered bone graft material noted. Postop changes in soft tissues posteriorly with an ill-defined hypodense fluid collection measuring 2 x 2.7 cm, image 42 extending from C3-C6. Postoperative seroma/ hematoma favored. Atherosclerosis of the major branch vessels. Scarring in the lung apices. IMPRESSION: Brain atrophy and chronic white matter microvascular ischemic changes. No acute intracranial process Right anterior frontal  scalp bruising/hematoma without underlying skull fracture No acute facial bony trauma or orbital fracture. Recent postop changes from posterior fusion spanning C3-T1 without acute cervical spine osseous abnormality or malalignment. Posterior neck paraspinous ill-defined fluid collection at the surgical site, seroma/hematoma is favored. Electronically Signed   By: Jerilynn Mages.  Shick M.D.   On: 02/10/2015 17:00   Ct Cervical Spine Wo Contrast  02/10/2015  CLINICAL DATA:  Tripping injury, fall, head and face injury on concrete. EXAM: CT HEAD WITHOUT CONTRAST CT MAXILLOFACIAL WITHOUT CONTRAST CT CERVICAL SPINE WITHOUT CONTRAST  TECHNIQUE: Multidetector CT imaging of the head, cervical spine, and maxillofacial structures were performed using the standard protocol without intravenous contrast. Multiplanar CT image reconstructions of the cervical spine and maxillofacial structures were also generated. COMPARISON:  01/20/2015, 01/28/2015 FINDINGS: CT HEAD FINDINGS Diffuse brain atrophy and chronic white matter microvascular ischemic changes throughout the cerebral hemispheres. No acute intracranial hemorrhage, definite infarction, mass lesion, midline shift, herniation, hydrocephalus, or extra-axial fluid collection. No focal mass effect or edema. Cisterns are patent. No cerebellar abnormality. Right anterior frontal scalp bruising/ hematoma evident. No underlying skull fracture or osseous abnormality. Atherosclerosis of the intracranial vessels of the skullbase. CT MAXILLOFACIAL FINDINGS Facial bones appear intact. No displaced fracture or asymmetry. Specifically, the mandible, maxilla, skullbase, zygomas, nasal septum, nasal bones, and orbits appear intact. No orbital abnormality or asymmetry. No proptosis. No blowout fracture evident. No significant sinus opacification or disease. Mastoids are clear. Dental implants noted. CT CERVICAL SPINE FINDINGS Patient is status post posterior bilateral fusion spanning C3-T1. Stable  alignment and multilevel degenerative changes most pronounced from C3-C7. Intact odontoid. Normal prevertebral soft tissues. No malalignment or definite acute osseous finding. Posterior laminectomies at C4, C5, and C6. Laminotomy defect at C6-7. Scattered bone graft material noted. Postop changes in soft tissues posteriorly with an ill-defined hypodense fluid collection measuring 2 x 2.7 cm, image 42 extending from C3-C6. Postoperative seroma/ hematoma favored. Atherosclerosis of the major branch vessels. Scarring in the lung apices. IMPRESSION: Brain atrophy and chronic white matter microvascular ischemic changes. No acute intracranial process Right anterior frontal scalp bruising/hematoma without underlying skull fracture No acute facial bony trauma or orbital fracture. Recent postop changes from posterior fusion spanning C3-T1 without acute cervical spine osseous abnormality or malalignment. Posterior neck paraspinous ill-defined fluid collection at the surgical site, seroma/hematoma is favored. Electronically Signed   By: Jerilynn Mages.  Shick M.D.   On: 02/10/2015 17:00   Ct Maxillofacial Wo Cm  02/10/2015  CLINICAL DATA:  Tripping injury, fall, head and face injury on concrete. EXAM: CT HEAD WITHOUT CONTRAST CT MAXILLOFACIAL WITHOUT CONTRAST CT CERVICAL SPINE WITHOUT CONTRAST TECHNIQUE: Multidetector CT imaging of the head, cervical spine, and maxillofacial structures were performed using the standard protocol without intravenous contrast. Multiplanar CT image reconstructions of the cervical spine and maxillofacial structures were also generated. COMPARISON:  01/20/2015, 01/28/2015 FINDINGS: CT HEAD FINDINGS Diffuse brain atrophy and chronic white matter microvascular ischemic changes throughout the cerebral hemispheres. No acute intracranial hemorrhage, definite infarction, mass lesion, midline shift, herniation, hydrocephalus, or extra-axial fluid collection. No focal mass effect or edema. Cisterns are patent. No  cerebellar abnormality. Right anterior frontal scalp bruising/ hematoma evident. No underlying skull fracture or osseous abnormality. Atherosclerosis of the intracranial vessels of the skullbase. CT MAXILLOFACIAL FINDINGS Facial bones appear intact. No displaced fracture or asymmetry. Specifically, the mandible, maxilla, skullbase, zygomas, nasal septum, nasal bones, and orbits appear intact. No orbital abnormality or asymmetry. No proptosis. No blowout fracture evident. No significant sinus opacification or disease. Mastoids are clear. Dental implants noted. CT CERVICAL SPINE FINDINGS Patient is status post posterior bilateral fusion spanning C3-T1. Stable alignment and multilevel degenerative changes most pronounced from C3-C7. Intact odontoid. Normal prevertebral soft tissues. No malalignment or definite acute osseous finding. Posterior laminectomies at C4, C5, and C6. Laminotomy defect at C6-7. Scattered bone graft material noted. Postop changes in soft tissues posteriorly with an ill-defined hypodense fluid collection measuring 2 x 2.7 cm, image 42 extending from C3-C6. Postoperative seroma/ hematoma favored. Atherosclerosis of the major branch vessels. Scarring in the lung  apices. IMPRESSION: Brain atrophy and chronic white matter microvascular ischemic changes. No acute intracranial process Right anterior frontal scalp bruising/hematoma without underlying skull fracture No acute facial bony trauma or orbital fracture. Recent postop changes from posterior fusion spanning C3-T1 without acute cervical spine osseous abnormality or malalignment. Posterior neck paraspinous ill-defined fluid collection at the surgical site, seroma/hematoma is favored. Electronically Signed   By: Jerilynn Mages.  Shick M.D.   On: 02/10/2015 17:00   I have personally reviewed and evaluated these images and lab results as part of my medical decision-making.   EKG Interpretation None      MDM   Final diagnoses:  Fall, initial encounter    Hematoma  Laceration    Patient presents with mechanical fall onto cement driveway.  VSS, NAD.  No LOC, N/V, dizziness.Patient in cervical collar. On exam, right frontal hematoma on right forehead with overlying abrasion.  Scabs noted on nose.  Small laceration to right pinky and small abrasion above it.  No other signs of trauma.  Heart RRR, lungs CTAB, abdomen soft and benign. Will obtain CT maxillofacial, head, and cervical spine.  Will give tylenol and Tdap.  Will clean and dress wounds. Will close superficial laceration with dermabond.   -CT maxillofacial, head, and cervical spine show no acute intracranial process, right anterior frontal scalp bruising/hematoma w/out underlying skull fracture.  No acute facial bony trauma or orbital fracture.  Recent postop changes from posterior fusion spanning C3-T1 w/out acute cervical spine osseous abnormality or malalignment.  Posterior neck paraspinous ill-defined fluid collection at the surgical site, seroma/hematoma is favored.  Evaluation does not show pathology requring ongoing emergent intervention or admission. Pt is hemodynamically stable and mentating appropriately. Discussed findings/results and plan with patient/guardian, who agrees with plan. All questions answered. Return precautions discussed and outpatient follow up given.   Case has been discussed with and seen by Dr. Ashok Cordia who agrees with the above plan for discharge.    Gloriann Loan, PA-C 02/10/15 1849  Lajean Saver, MD 02/10/15 [phone]

## 2015-02-10 NOTE — ED Notes (Signed)
Pt to department via EMS- pt was walking her dog down the downstairs and pulled her. Pt reports it was a couple of steps and is concerned because she had neck surgery about a week ago. Pt is wearing aspen c-collar from surgery at this time. No LOC. Right-sided hematoma to forehead. Bp-160/90 Hr-90 RR-16

## 2015-02-10 NOTE — Discharge Instructions (Signed)
Hematoma   A hematoma is a collection of blood under the skin, in an organ, in a body space, in a joint space, or in other tissue. The blood can clot to form a lump that you can see and feel. The lump is often firm and may sometimes become sore and tender. Most hematomas get better in a few days to weeks. However, some hematomas may be serious and require medical care. Hematomas can range in size from very small to very large.  CAUSES  A hematoma can be caused by a blunt or penetrating injury. It can also be caused by spontaneous leakage from a blood vessel under the skin. Spontaneous leakage from a blood vessel is more likely to occur in older people, especially those taking blood thinners. Sometimes, a hematoma can develop after certain medical procedures.  SIGNS AND SYMPTOMS  A firm lump on the body.  Possible pain and tenderness in the area.  Bruising. Blue, dark blue, purple-red, or yellowish skin may appear at the site of the hematoma if the hematoma is close to the surface of the skin. For hematomas in deeper tissues or body spaces, the signs and symptoms may be subtle. For example, an intra-abdominal hematoma may cause abdominal pain, weakness, fainting, and shortness of breath. An intracranial hematoma may cause a headache or symptoms such as weakness, trouble speaking, or a change in consciousness.  DIAGNOSIS  A hematoma can usually be diagnosed based on your medical history and a physical exam. Imaging tests may be needed if your health care provider suspects a hematoma in deeper tissues or body spaces, such as the abdomen, head, or chest. These tests may include ultrasonography or a CT scan.  TREATMENT  Hematomas usually go away on their own over time. Rarely does the blood need to be drained out of the body. Large hematomas or those that may affect vital organs will sometimes need surgical drainage or monitoring.  HOME CARE INSTRUCTIONS  Apply ice to the injured area:  Put ice in a  plastic bag.  Place a towel between your skin and the bag.  Leave the ice on for 20 minutes, 2-3 times a day for the first 1 to 2 days.  After the first 2 days, switch to using warm compresses on the hematoma.  Elevate the injured area to help decrease pain and swelling. Wrapping the area with an elastic bandage may also be helpful. Compression helps to reduce swelling and promotes shrinking of the hematoma. Make sure the bandage is not wrapped too tight.  If your hematoma is on a lower extremity and is painful, crutches may be helpful for a couple days.  Only take over-the-counter or prescription medicines as directed by your health care provider. SEEK IMMEDIATE MEDICAL CARE IF:  You have increasing pain, or your pain is not controlled with medicine.  You have a fever.  You have worsening swelling or discoloration.  Your skin over the hematoma breaks or starts bleeding.  Your hematoma is in your chest or abdomen and you have weakness, shortness of breath, or a change in consciousness.  Your hematoma is on your scalp (caused by a fall or injury) and you have a worsening headache or a change in alertness or consciousness. MAKE SURE YOU:  Understand these instructions.  Will watch your condition.  Will get help right away if you are not doing well or get worse. This information is not intended to replace advice given to you by your health care provider. Make  sure you discuss any questions you have with your health care provider.  Document Released: 10/12/2003 Document Revised: 10/30/2012 Document Reviewed: 08/07/2012  Elsevier Interactive Patient Education 2016 [employer]   An abrasion is a cut or scrape on the outer surface of your skin. An abrasion does not extend through all of the layers of your skin. It is important to care for your abrasion properly to prevent infection.  CAUSES  Most abrasions are caused by falling on or gliding across the ground or another surface. When  your skin rubs on something, the outer and inner layer of skin rubs off.  SYMPTOMS  A cut or scrape is the main symptom of this condition. The scrape may be bleeding, or it may appear red or pink. If there was an associated fall, there may be an underlying bruise.  DIAGNOSIS  An abrasion is diagnosed with a physical exam.  TREATMENT  Treatment for this condition depends on how large and deep the abrasion is. Usually, your abrasion will be cleaned with water and mild soap. This removes any dirt or debris that may be stuck. An antibiotic ointment may be applied to the abrasion to help prevent infection. A bandage (dressing) may be placed on the abrasion to keep it clean.  You may also need a tetanus shot.  HOME CARE INSTRUCTIONS  Medicines  Take or apply medicines only as directed by your health care provider.  If you were prescribed an antibiotic ointment, finish all of it even if you start to feel better. Wound Care  Clean the wound with mild soap and water 2-3 times per day or as directed by your health care provider. Pat your wound dry with a clean towel. Do not rub it.  There are many different ways to close and cover a wound. Follow instructions from your health care provider about:  Wound care.  Dressing changes and removal. Check your wound every day for signs of infection. Watch for:  Redness, swelling, or pain.  Fluid, blood, or pus. General Instructions   Keep the dressing dry as directed by your health care provider. Do not take baths, swim, use a hot tub, or do anything that would put your wound underwater until your health care provider approves.  If there is swelling, raise (elevate) the injured area above the level of your heart while you are sitting or lying down.  Keep all follow-up visits as directed by your health care provider. This is important. SEEK MEDICAL CARE IF:  You received a tetanus shot and you have swelling, severe pain, redness, or bleeding at the injection  site.  Your pain is not controlled with medicine.  You have increased redness, swelling, or pain at the site of your wound. SEEK IMMEDIATE MEDICAL CARE IF:  You have a red streak going away from your wound.  You have a fever.  You have fluid, blood, or pus coming from your wound.  You notice a bad smell coming from your wound or your dressing. This information is not intended to replace advice given to you by your health care provider. Make sure you discuss any questions you have with your health care provider.  Document Released: 12/07/2004 Document Revised: 11/18/2014 Document Reviewed: 02/25/2014  Elsevier Interactive Patient Education [employer].

## 2015-02-10 NOTE — ED Notes (Signed)
Patient's son states he has been falling more and more frequently since having back surgery in February this year.

## 2015-02-12 ENCOUNTER — Emergency Department (HOSPITAL_COMMUNITY)
Admission: EM | Admit: 2015-02-12 | Discharge: 2015-02-13 | Disposition: A | Discharge status: Skilled Nursing Facility | Payer: PPO | Attending: Emergency Medicine | Admitting: Emergency Medicine

## 2015-02-12 ENCOUNTER — Emergency Department (HOSPITAL_COMMUNITY): Payer: PPO

## 2015-02-12 DIAGNOSIS — Z8543 Personal history of malignant neoplasm of ovary: Secondary | ICD-10-CM | POA: Insufficient documentation

## 2015-02-12 DIAGNOSIS — J449 Chronic obstructive pulmonary disease, unspecified: Secondary | ICD-10-CM | POA: Diagnosis not present

## 2015-02-12 DIAGNOSIS — R531 Weakness: Secondary | ICD-10-CM | POA: Diagnosis not present

## 2015-02-12 DIAGNOSIS — M810 Age-related osteoporosis without current pathological fracture: Secondary | ICD-10-CM | POA: Diagnosis not present

## 2015-02-12 DIAGNOSIS — K219 Gastro-esophageal reflux disease without esophagitis: Secondary | ICD-10-CM | POA: Insufficient documentation

## 2015-02-12 DIAGNOSIS — G4700 Insomnia, unspecified: Secondary | ICD-10-CM | POA: Insufficient documentation

## 2015-02-12 DIAGNOSIS — F0390 Unspecified dementia without behavioral disturbance: Secondary | ICD-10-CM | POA: Diagnosis not present

## 2015-02-12 DIAGNOSIS — N189 Chronic kidney disease, unspecified: Secondary | ICD-10-CM | POA: Insufficient documentation

## 2015-02-12 DIAGNOSIS — I129 Hypertensive chronic kidney disease with stage 1 through stage 4 chronic kidney disease, or unspecified chronic kidney disease: Secondary | ICD-10-CM | POA: Diagnosis not present

## 2015-02-12 DIAGNOSIS — Z8701 Personal history of pneumonia (recurrent): Secondary | ICD-10-CM | POA: Diagnosis not present

## 2015-02-12 DIAGNOSIS — Z86718 Personal history of other venous thrombosis and embolism: Secondary | ICD-10-CM | POA: Diagnosis not present

## 2015-02-12 DIAGNOSIS — Z8639 Personal history of other endocrine, nutritional and metabolic disease: Secondary | ICD-10-CM | POA: Insufficient documentation

## 2015-02-12 DIAGNOSIS — G8929 Other chronic pain: Secondary | ICD-10-CM | POA: Diagnosis not present

## 2015-02-12 DIAGNOSIS — F419 Anxiety disorder, unspecified: Secondary | ICD-10-CM | POA: Insufficient documentation

## 2015-02-12 LAB — CBC WITH DIFFERENTIAL/PLATELET
BASOS ABS: 0 10*3/uL (ref 0.0–0.1)
BASOS PCT: 0 %
Eosinophils Absolute: 0 10*3/uL (ref 0.0–0.7)
Eosinophils Relative: 0 %
HEMATOCRIT: 42.2 % (ref 36.0–46.0)
HEMOGLOBIN: 14.3 g/dL (ref 12.0–15.0)
Lymphocytes Relative: 15 %
Lymphs Abs: 1.7 10*3/uL (ref 0.7–4.0)
MCH: 29.1 pg (ref 26.0–34.0)
MCHC: 33.9 g/dL (ref 30.0–36.0)
MCV: 85.9 fL (ref 78.0–100.0)
Monocytes Absolute: 0.6 10*3/uL (ref 0.1–1.0)
Monocytes Relative: 5 %
NEUTROS ABS: 9.4 10*3/uL — AB (ref 1.7–7.7)
NEUTROS PCT: 80 %
Platelets: 279 10*3/uL (ref 150–400)
RBC: 4.91 MIL/uL (ref 3.87–5.11)
RDW: 12.8 % (ref 11.5–15.5)
WBC: 11.7 10*3/uL — AB (ref 4.0–10.5)

## 2015-02-12 LAB — URINALYSIS, ROUTINE W REFLEX MICROSCOPIC
Bilirubin Urine: NEGATIVE
Glucose, UA: NEGATIVE mg/dL
KETONES UR: NEGATIVE mg/dL
LEUKOCYTES UA: NEGATIVE
NITRITE: NEGATIVE
PH: 6 (ref 5.0–8.0)
Protein, ur: 100 mg/dL — AB
SPECIFIC GRAVITY, URINE: 1.018 (ref 1.005–1.030)

## 2015-02-12 LAB — COMPREHENSIVE METABOLIC PANEL
ALBUMIN: 3.8 g/dL (ref 3.5–5.0)
ALT: 24 U/L (ref 14–54)
ANION GAP: 11 (ref 5–15)
AST: 42 U/L — ABNORMAL HIGH (ref 15–41)
Alkaline Phosphatase: 81 U/L (ref 38–126)
BILIRUBIN TOTAL: 0.9 mg/dL (ref 0.3–1.2)
BUN: 24 mg/dL — ABNORMAL HIGH (ref 6–20)
CO2: 29 mmol/L (ref 22–32)
Calcium: 9.5 mg/dL (ref 8.9–10.3)
Chloride: 101 mmol/L (ref 101–111)
Creatinine, Ser: 1.04 mg/dL — ABNORMAL HIGH (ref 0.44–1.00)
GFR calc Af Amer: 55 mL/min — ABNORMAL LOW (ref >60)
GFR, EST NON AFRICAN AMERICAN: 47 mL/min — AB (ref >60)
Glucose, Bld: 140 mg/dL — ABNORMAL HIGH (ref 65–99)
POTASSIUM: 3.3 mmol/L — AB (ref 3.5–5.1)
Sodium: 141 mmol/L (ref 135–145)
TOTAL PROTEIN: 7.7 g/dL (ref 6.5–8.1)

## 2015-02-12 LAB — TROPONIN I

## 2015-02-12 LAB — URINE MICROSCOPIC-ADD ON: WBC UA: NONE SEEN WBC/hpf (ref 0–5)

## 2015-02-12 MED ORDER — HYDROCHLOROTHIAZIDE 25 MG PO TABS
25.0000 mg | ORAL_TABLET | Freq: Every day | ORAL | Status: DC
Start: 2015-02-13 — End: 2015-02-13
  Administered 2015-02-13: 25 mg via ORAL
  Filled 2015-02-12: qty 1

## 2015-02-12 MED ORDER — AMLODIPINE BESYLATE 5 MG PO TABS
5.0000 mg | ORAL_TABLET | Freq: Two times a day (BID) | ORAL | Status: DC
  Administered 2015-02-12 – 2015-02-13 (×2): 5 mg via ORAL
  Filled 2015-02-12 (×2): qty 1

## 2015-02-12 MED ORDER — SERTRALINE HCL 50 MG PO TABS
50.0000 mg | ORAL_TABLET | Freq: Every day | ORAL | Status: DC
  Administered 2015-02-12: 50 mg via ORAL
  Filled 2015-02-12: qty 1

## 2015-02-12 MED ORDER — DONEPEZIL HCL 5 MG PO TABS
5.0000 mg | ORAL_TABLET | Freq: Every day | ORAL | Status: DC
  Administered 2015-02-12: 5 mg via ORAL
  Filled 2015-02-12: qty 1

## 2015-02-12 MED ORDER — DIAZEPAM 5 MG PO TABS
5.0000 mg | ORAL_TABLET | Freq: Every day | ORAL | Status: DC
  Administered 2015-02-12: 5 mg via ORAL
  Filled 2015-02-12: qty 1

## 2015-02-12 MED ORDER — MELATONIN 3 MG PO TABS
1.0000 | ORAL_TABLET | Freq: Every day | ORAL | Status: DC
  Administered 2015-02-12: 3 mg via ORAL
  Filled 2015-02-12 (×2): qty 1

## 2015-02-12 MED ORDER — ALBUTEROL SULFATE HFA 108 (90 BASE) MCG/ACT IN AERS: 2.0000 | INHALATION_SPRAY | RESPIRATORY_TRACT | Status: DC | PRN

## 2015-02-12 MED ORDER — FAMOTIDINE 20 MG PO TABS
20.0000 mg | ORAL_TABLET | Freq: Every day | ORAL | Status: DC
  Administered 2015-02-13: 20 mg via ORAL
  Filled 2015-02-12: qty 1

## 2015-02-12 MED ORDER — ATENOLOL 25 MG PO TABS
12.5000 mg | ORAL_TABLET | Freq: Every day | ORAL | Status: DC
  Administered 2015-02-13: 12.5 mg via ORAL
  Filled 2015-02-12: qty 1

## 2015-02-12 MED ORDER — TRAZODONE HCL 50 MG PO TABS
50.0000 mg | ORAL_TABLET | Freq: Every day | ORAL | Status: DC
  Administered 2015-02-12: 50 mg via ORAL
  Filled 2015-02-12: qty 1

## 2015-02-12 MED ORDER — GABAPENTIN 300 MG PO CAPS
300.0000 mg | ORAL_CAPSULE | Freq: Two times a day (BID) | ORAL | Status: DC
  Administered 2015-02-12 – 2015-02-13 (×2): 300 mg via ORAL
  Filled 2015-02-12 (×2): qty 1

## 2015-02-12 MED ORDER — OXYCODONE-ACETAMINOPHEN 5-325 MG PO TABS: 1.0000 | ORAL_TABLET | ORAL | Status: DC | PRN

## 2015-02-12 MED ORDER — SODIUM CHLORIDE 0.9 % IV BOLUS (SEPSIS)
500.0000 mL | Freq: Once | INTRAVENOUS | Status: AC
  Administered 2015-02-12: 500 mL via INTRAVENOUS

## 2015-02-12 NOTE — Care Management (Signed)
CM discussed with ED CSW, who will work on process of placement from the ED.  Updated Dr. Stark Jock, PT and OT eval ordered. ED CSW will continue to follow for placement this weekend.

## 2015-02-12 NOTE — Care Management (Signed)
ED CM discussed disposition plan with Dr. Stark Jock.  Patient was discharged from hospital s/p cervical fusion. Since surgery patient has been c/o weakness and has had several falls family called EMS and her brought to the ED patient and family's goal of care is for her to return to her pre surgical level of functioning. Patient and family are questing assistance with placement.  CM spoke with daughter Jeannene Patella regarding possible plan for placement from ED, Explained placement from the ED is possible CM will consult with ED CSW. CM will continue to follow up.

## 2015-02-12 NOTE — ED Provider Notes (Signed)
CSN: [id number]     Arrival date & time 02/12/15  [id number] History   First MD Initiated Contact with Patient 02/12/15 [phone]     Chief Complaint  Patient presents with  . Weakness     (Consider location/radiation/quality/duration/timing/severity/associated sxs/prior Treatment) HPI Comments: Patient is an 81 year old female with history of dementia, peripheral vascular disease, recent neck surgery by Dr. Lynann Bologna. She is brought for evaluation of weakness. She has been having frequent falls recently and reports having "no strength in her legs". She denies any fevers or chills. She denies any chest pain, shortness of breath, fever, or other complaints.  Patient is a 81 y.o. female presenting with weakness. The history is provided by the patient.  Weakness This is a new problem. The current episode started more than 1 week ago. The problem occurs constantly. The problem has been gradually worsening. Pertinent negatives include no chest pain, no abdominal pain, no headaches and no shortness of breath. Nothing aggravates the symptoms. Nothing relieves the symptoms. She has tried nothing for the symptoms. The treatment provided no relief.    Past Medical History  Diagnosis Date  . Cancer ([place])     ovarian  . Restless leg syndrome   . DVT (deep venous thrombosis) ([facility])     right leg  . Peripheral neuropathy ([place])   . Dyslipidemia     but doesn't take any meds   . Ovarian cancer ([place])   . Osteoporosis     takes Vit D daily  . Incontinence of urine   . Esophageal dysmotility   . Zenker's diverticulum     small  . Decreased GFR   . Chronic kidney disease   . DJD (degenerative joint disease) of cervical spine   . Radiculopathy   . CTS (carpal tunnel syndrome)   . Anxiety     takes Valium daily  . Hypertension     takes Amlodipine and Atenolol daily  . Insomnia     takes Trazodone nightly  . Depression     takes Zoloft daily  . COPD (chronic obstructive pulmonary disease) ([place])      ProAir daily as needed  . GERD (gastroesophageal reflux disease)     takes pepcid daily  . Pneumonia     hx of-15+yrs ago  . Chronic back pain   . Cervical disc disease   . Neuropathy ([place])   . Urinary frequency   . Nocturia   . Peripheral vascular disease ([place])   . Dementia     takes aricept   Past Surgical History  Procedure Laterality Date  . Vein surgery  1950  . Abdominal aortagram  Feb. 20, 2015  . Abdominal aortagram N/A 05/02/2013    Procedure: ABDOMINAL Maxcine Ham;  Surgeon: Elam Dutch, MD;  [facility] C[facility] LAB;  Service: Cardiovascular;  Laterality: N/A;  . Abdominal hysterectomy    . Tonsillectomy    . Cataract surgery Bilateral   . Colonoscopy    . Back surgery    . Posterior cervical fusion/foraminotomy N/A 01/28/2015    Procedure: POSTERIOR CERVICAL DECOMPRESSION FUSION LEVEL 5 WITH INSTRUMENTATION AND ALLOGRAFT;  Surgeon: Phylliss Bob, MD;  Location: [facility];  Service: Orthopedics;  Laterality: N/A;  Cervical posterior decompression fusion, cervical 3-4, cervical 4-5, cervical 5-6, cervical 6-7, cervical 6-thoracic 1 with instrumentation and allograft.   Family History  Problem Relation Age of Onset  . Hypertension Mother   . Stroke Mother   . Lung cancer Son     Deceased, 56  .  Healthy Son   . Other Father     MVA  . Hypertension Brother   . Stroke Brother   . Kidney disease Sister   . Stroke Brother   . Diabetes Brother     DM  . Stroke Brother   . Cancer Sister     throat   Social History  Substance Use Topics  . Smoking status: Never Smoker   . Smokeless tobacco: Never Used  . Alcohol Use: No   OB History    No data available     Review of Systems  Respiratory: Negative for shortness of breath.   Cardiovascular: Negative for chest pain.  Gastrointestinal: Negative for abdominal pain.  Neurological: Positive for weakness. Negative for headaches.  All other systems reviewed and are negative.     Allergies  Aspirin;  Formaldehyde; and Sodium pentobarbital  Home Medications   Prior to Admission medications   Medication Sig Start Date End Date Taking? Authorizing Provider  albuterol (PROVENTIL HFA;VENTOLIN HFA) 108 (90 BASE) MCG/ACT inhaler Inhale 2 puffs into the lungs every 4 (four) hours as needed for wheezing or shortness of breath.     Historical Provider, MD  amLODipine (NORVASC) 5 MG tablet Take 5 mg by mouth 2 (two) times daily.     Historical Provider, MD  atenolol (TENORMIN) 25 MG tablet Take 12.5 mg by mouth daily.    Historical Provider, MD  B Complex-C (B-COMPLEX WITH VITAMIN C) tablet Take 1 tablet by mouth daily.    Historical Provider, MD  cholecalciferol (VITAMIN D) 1000 UNITS tablet Take 2,000 Units by mouth daily.    Historical Provider, MD  diazepam (VALIUM) 5 MG tablet Take 5 mg by mouth at bedtime.     Historical Provider, MD  donepezil (ARICEPT) 5 MG tablet Take 1 tablet daily for one month, then increase to 2 tablets. Patient taking differently: Take 5 mg by mouth at bedtime.  07/02/14   Donika K Patel, DO  famotidine (PEPCID) 20 MG tablet Take 20 mg by mouth daily.  06/10/14   Historical Provider, MD  gabapentin (NEURONTIN) 300 MG capsule Take 300 mg by mouth 2 (two) times daily.     Historical Provider, MD  hydrochlorothiazide (HYDRODIURIL) 25 MG tablet Take 25 mg by mouth daily.  02/24/13   Historical Provider, MD  Melatonin-Pyridoxine 3-1 MG TABS Take 1 tablet by mouth at bedtime.    Historical Provider, MD  Misc. Devices (ROLLATOR ULTRA-LIGHT) MISC 1 each by Does not apply route daily. 02/20/14   Alda Berthold, DO  Multiple Vitamin (MULTIVITAMIN WITH MINERALS) TABS tablet Take 1 tablet by mouth daily.    Historical Provider, MD  OVER THE COUNTER MEDICATION Take 2 capsules by mouth daily. Ultimate Colon Care Formula - herbal supplement    Historical Provider, MD  oxyCODONE-acetaminophen (PERCOCET/ROXICET) 5-325 MG tablet Take 1 tablet by mouth every 4 (four) hours as needed for  severe pain.  01/30/15   Historical Provider, MD  sertraline (ZOLOFT) 50 MG tablet Take 50 mg by mouth at bedtime.    Historical Provider, MD  sodium chloride (OCEAN) 0.65 % SOLN nasal spray Place 1 spray into both nostrils at bedtime as needed for congestion.    Historical Provider, MD  solifenacin (VESICARE) 5 MG tablet Take 5 mg by mouth daily.    Historical Provider, MD  traZODone (DESYREL) 50 MG tablet Take 50 mg by mouth at bedtime.    Historical Provider, MD   BP 160/67 mmHg  Pulse 81  Temp(Src) 99.6 F (37.6 C) (Oral)  SpO2 96% Physical Exam  Constitutional: She is oriented to person, place, and time. She appears well-developed and well-nourished. No distress.  HENT:  Head: Normocephalic and atraumatic.  Mouth/Throat: Oropharynx is clear and moist.  She has multiple abrasions to her forehead as well as periorbital ecchymoses. These appear old.  Eyes: EOM are normal. Pupils are equal, round, and reactive to light.  Neck: Normal range of motion. Neck supple.  She is also wearing a cervical collar.  Cardiovascular: Normal rate and regular rhythm.  Exam reveals no gallop and no friction rub.   No murmur heard. Pulmonary/Chest: Effort normal and breath sounds normal. No respiratory distress. She has no wheezes.  Abdominal: Soft. Bowel sounds are normal. She exhibits no distension. There is no tenderness.  Musculoskeletal: Normal range of motion. She exhibits no edema.  Neurological: She is alert and oriented to person, place, and time. No cranial nerve deficit. She exhibits normal muscle tone. Coordination normal.  Skin: Skin is warm and dry. She is not diaphoretic.  Nursing note and vitals reviewed.   ED Course  Procedures (including critical care time) Labs Review Labs Reviewed  COMPREHENSIVE METABOLIC PANEL  TROPONIN I  CBC WITH DIFFERENTIAL/PLATELET  URINALYSIS, ROUTINE W REFLEX MICROSCOPIC (NOT AT [facility])    Imaging Review Ct Head Wo Contrast  02/10/2015  CLINICAL  DATA:  Tripping injury, fall, head and face injury on concrete. EXAM: CT HEAD WITHOUT CONTRAST CT MAXILLOFACIAL WITHOUT CONTRAST CT CERVICAL SPINE WITHOUT CONTRAST TECHNIQUE: Multidetector CT imaging of the head, cervical spine, and maxillofacial structures were performed using the standard protocol without intravenous contrast. Multiplanar CT image reconstructions of the cervical spine and maxillofacial structures were also generated. COMPARISON:  01/20/2015, 01/28/2015 FINDINGS: CT HEAD FINDINGS Diffuse brain atrophy and chronic white matter microvascular ischemic changes throughout the cerebral hemispheres. No acute intracranial hemorrhage, definite infarction, mass lesion, midline shift, herniation, hydrocephalus, or extra-axial fluid collection. No focal mass effect or edema. Cisterns are patent. No cerebellar abnormality. Right anterior frontal scalp bruising/ hematoma evident. No underlying skull fracture or osseous abnormality. Atherosclerosis of the intracranial vessels of the skullbase. CT MAXILLOFACIAL FINDINGS Facial bones appear intact. No displaced fracture or asymmetry. Specifically, the mandible, maxilla, skullbase, zygomas, nasal septum, nasal bones, and orbits appear intact. No orbital abnormality or asymmetry. No proptosis. No blowout fracture evident. No significant sinus opacification or disease. Mastoids are clear. Dental implants noted. CT CERVICAL SPINE FINDINGS Patient is status post posterior bilateral fusion spanning C3-T1. Stable alignment and multilevel degenerative changes most pronounced from C3-C7. Intact odontoid. Normal prevertebral soft tissues. No malalignment or definite acute osseous finding. Posterior laminectomies at C4, C5, and C6. Laminotomy defect at C6-7. Scattered bone graft material noted. Postop changes in soft tissues posteriorly with an ill-defined hypodense fluid collection measuring 2 x 2.7 cm, image 42 extending from C3-C6. Postoperative seroma/ hematoma favored.  Atherosclerosis of the major branch vessels. Scarring in the lung apices. IMPRESSION: Brain atrophy and chronic white matter microvascular ischemic changes. No acute intracranial process Right anterior frontal scalp bruising/hematoma without underlying skull fracture No acute facial bony trauma or orbital fracture. Recent postop changes from posterior fusion spanning C3-T1 without acute cervical spine osseous abnormality or malalignment. Posterior neck paraspinous ill-defined fluid collection at the surgical site, seroma/hematoma is favored. Electronically Signed   By: Jerilynn Mages.  Shick M.D.   On: 02/10/2015 17:00   Ct Cervical Spine Wo Contrast  02/10/2015  CLINICAL DATA:  Tripping injury, fall, head and face injury  on concrete. EXAM: CT HEAD WITHOUT CONTRAST CT MAXILLOFACIAL WITHOUT CONTRAST CT CERVICAL SPINE WITHOUT CONTRAST TECHNIQUE: Multidetector CT imaging of the head, cervical spine, and maxillofacial structures were performed using the standard protocol without intravenous contrast. Multiplanar CT image reconstructions of the cervical spine and maxillofacial structures were also generated. COMPARISON:  01/20/2015, 01/28/2015 FINDINGS: CT HEAD FINDINGS Diffuse brain atrophy and chronic white matter microvascular ischemic changes throughout the cerebral hemispheres. No acute intracranial hemorrhage, definite infarction, mass lesion, midline shift, herniation, hydrocephalus, or extra-axial fluid collection. No focal mass effect or edema. Cisterns are patent. No cerebellar abnormality. Right anterior frontal scalp bruising/ hematoma evident. No underlying skull fracture or osseous abnormality. Atherosclerosis of the intracranial vessels of the skullbase. CT MAXILLOFACIAL FINDINGS Facial bones appear intact. No displaced fracture or asymmetry. Specifically, the mandible, maxilla, skullbase, zygomas, nasal septum, nasal bones, and orbits appear intact. No orbital abnormality or asymmetry. No proptosis. No blowout  fracture evident. No significant sinus opacification or disease. Mastoids are clear. Dental implants noted. CT CERVICAL SPINE FINDINGS Patient is status post posterior bilateral fusion spanning C3-T1. Stable alignment and multilevel degenerative changes most pronounced from C3-C7. Intact odontoid. Normal prevertebral soft tissues. No malalignment or definite acute osseous finding. Posterior laminectomies at C4, C5, and C6. Laminotomy defect at C6-7. Scattered bone graft material noted. Postop changes in soft tissues posteriorly with an ill-defined hypodense fluid collection measuring 2 x 2.7 cm, image 42 extending from C3-C6. Postoperative seroma/ hematoma favored. Atherosclerosis of the major branch vessels. Scarring in the lung apices. IMPRESSION: Brain atrophy and chronic white matter microvascular ischemic changes. No acute intracranial process Right anterior frontal scalp bruising/hematoma without underlying skull fracture No acute facial bony trauma or orbital fracture. Recent postop changes from posterior fusion spanning C3-T1 without acute cervical spine osseous abnormality or malalignment. Posterior neck paraspinous ill-defined fluid collection at the surgical site, seroma/hematoma is favored. Electronically Signed   By: Jerilynn Mages.  Shick M.D.   On: 02/10/2015 17:00   Ct Maxillofacial Wo Cm  02/10/2015  CLINICAL DATA:  Tripping injury, fall, head and face injury on concrete. EXAM: CT HEAD WITHOUT CONTRAST CT MAXILLOFACIAL WITHOUT CONTRAST CT CERVICAL SPINE WITHOUT CONTRAST TECHNIQUE: Multidetector CT imaging of the head, cervical spine, and maxillofacial structures were performed using the standard protocol without intravenous contrast. Multiplanar CT image reconstructions of the cervical spine and maxillofacial structures were also generated. COMPARISON:  01/20/2015, 01/28/2015 FINDINGS: CT HEAD FINDINGS Diffuse brain atrophy and chronic white matter microvascular ischemic changes throughout the cerebral  hemispheres. No acute intracranial hemorrhage, definite infarction, mass lesion, midline shift, herniation, hydrocephalus, or extra-axial fluid collection. No focal mass effect or edema. Cisterns are patent. No cerebellar abnormality. Right anterior frontal scalp bruising/ hematoma evident. No underlying skull fracture or osseous abnormality. Atherosclerosis of the intracranial vessels of the skullbase. CT MAXILLOFACIAL FINDINGS Facial bones appear intact. No displaced fracture or asymmetry. Specifically, the mandible, maxilla, skullbase, zygomas, nasal septum, nasal bones, and orbits appear intact. No orbital abnormality or asymmetry. No proptosis. No blowout fracture evident. No significant sinus opacification or disease. Mastoids are clear. Dental implants noted. CT CERVICAL SPINE FINDINGS Patient is status post posterior bilateral fusion spanning C3-T1. Stable alignment and multilevel degenerative changes most pronounced from C3-C7. Intact odontoid. Normal prevertebral soft tissues. No malalignment or definite acute osseous finding. Posterior laminectomies at C4, C5, and C6. Laminotomy defect at C6-7. Scattered bone graft material noted. Postop changes in soft tissues posteriorly with an ill-defined hypodense fluid collection measuring 2 x 2.7 cm, image 42 extending  from C3-C6. Postoperative seroma/ hematoma favored. Atherosclerosis of the major branch vessels. Scarring in the lung apices. IMPRESSION: Brain atrophy and chronic white matter microvascular ischemic changes. No acute intracranial process Right anterior frontal scalp bruising/hematoma without underlying skull fracture No acute facial bony trauma or orbital fracture. Recent postop changes from posterior fusion spanning C3-T1 without acute cervical spine osseous abnormality or malalignment. Posterior neck paraspinous ill-defined fluid collection at the surgical site, seroma/hematoma is favored. Electronically Signed   By: Jerilynn Mages.  Shick M.D.   On:  02/10/2015 17:00   I have personally reviewed and evaluated these images and lab results as part of my medical decision-making.   EKG Interpretation   Date/Time:  Friday February 12 2015 11:57:41 EST Ventricular Rate:  76 PR Interval:  170 QRS Duration: 87 QT Interval:  377 QTC Calculation: 424 R Axis:   27 Text Interpretation:  Sinus rhythm Atrial premature complex Nonspecific T  abnormalities, diffuse leads Confirmed by Crystale Giannattasio  MD, Hiren Peplinski ([id number]) on  02/12/2015 4:55:05 PM      MDM   Final diagnoses:  None    Patient presents here for complaints of weakness and difficulty ambulating. She is had multiple falls in the past week. She appears to be significantly deconditioned following her neck surgery. Today's workup is unremarkable identifying no causes for her weakness. She was evaluated by case management who feels as though she can be placed in a skilled nursing facility for rehabilitation from the emergency department. She will stay on pod C overnight and arrangements will be made for a rehabilitation facility for tomorrow.    Veryl Speak, MD 02/12/15 [phone]

## 2015-02-12 NOTE — NC FL2 (Signed)
MEDICAID FL2 LEVEL OF CARE SCREENING TOOL     IDENTIFICATION  Patient Name: Misty Patterson Birthdate: 14-May-1926 Sex: female Admission Date (Current Location): 02/12/2015  [facility] and [place] Number:  ([place])   Facility and Address:  [facility], [place] [address], [place], [place] [address]      Provider Number: [id number]  Attending Physician Name and Address:  Veryl Speak, MD  Relative Name and Phone Number:       Current Level of Care: Hospital Recommended Level of Care: [place] Prior Approval Number:    Date Approved/Denied:   PASRR Number:    Discharge Plan: SNF    Current Diagnoses: Patient Active Problem List   Diagnosis Date Noted  . Myelopathy ([place]) 01/28/2015  . Chest pain 01/22/2015  . Abnormal ECG 01/22/2015  . Dementia 01/01/2015  . Cervical stenosis of spinal canal 01/01/2015  . Hereditary and idiopathic peripheral neuropathy 01/01/2015  . Radiculopathy 05/06/2014  . Pain of right lower leg 09/04/2013  . Paresthesia of both hands 09/04/2013  . Aftercare following surgery of the circulatory system, [place] 05/23/2013  . Muscle twitching-Right Leg 05/23/2013  . Peripheral vascular disease, unspecified ([place]) 04/03/2013    Orientation ACTIVITIES/SOCIAL BLADDER RESPIRATION    Self  Family supportive Incontinent Normal  BEHAVIORAL SYMPTOMS/MOOD NEUROLOGICAL BOWEL NUTRITION STATUS      Continent Diet (low sodium heart healthy)  PHYSICIAN VISITS COMMUNICATION OF NEEDS Height & Weight Skin  30 days Verbally 5\' 4"  (162.6 cm) 144 lbs. Normal          AMBULATORY STATUS RESPIRATION    Assist extensive Normal      Personal Care Assistance Level of Assistance  Bathing, Dressing Bathing Assistance: Maximum assistance   Dressing Assistance: Maximum assistance      Functional Limitations Info                SPECIAL CARE FACTORS FREQUENCY  PT (By licensed PT), OT (By licensed OT)     PT  Frequency:  (5x week) OT Frequency: 5x week           Additional Factors Info  Psychotropic               Current Medications (02/12/2015):  This is the current hospital active medication list No current facility-administered medications for this encounter.   Current Outpatient Prescriptions  Medication Sig Dispense Refill  . albuterol (PROVENTIL HFA;VENTOLIN HFA) 108 (90 BASE) MCG/ACT inhaler Inhale 2 puffs into the lungs every 4 (four) hours as needed for wheezing or shortness of breath.     Marland Kitchen amLODipine (NORVASC) 5 MG tablet Take 5 mg by mouth 2 (two) times daily.     Marland Kitchen atenolol (TENORMIN) 25 MG tablet Take 12.5 mg by mouth daily.    . B Complex-C (B-COMPLEX WITH VITAMIN C) tablet Take 1 tablet by mouth daily.    . cholecalciferol (VITAMIN D) 1000 UNITS tablet Take 2,000 Units by mouth daily.    . diazepam (VALIUM) 5 MG tablet Take 5 mg by mouth at bedtime.     . donepezil (ARICEPT) 5 MG tablet Take 1 tablet daily for one month, then increase to 2 tablets. (Patient taking differently: Take 5 mg by mouth at bedtime. ) 60 tablet 3  . famotidine (PEPCID) 20 MG tablet Take 20 mg by mouth daily.   3  . gabapentin (NEURONTIN) 300 MG capsule Take 300 mg by mouth 2 (two) times daily.     . hydrochlorothiazide (  HYDRODIURIL) 25 MG tablet Take 25 mg by mouth daily.     . Melatonin-Pyridoxine 3-1 MG TABS Take 1 tablet by mouth at bedtime.    . Multiple Vitamin (MULTIVITAMIN WITH MINERALS) TABS tablet Take 1 tablet by mouth daily.    Marland Kitchen OVER THE COUNTER MEDICATION Take 2 capsules by mouth daily. Ultimate Colon Care Formula - herbal supplement    . oxyCODONE-acetaminophen (PERCOCET/ROXICET) 5-325 MG tablet Take 1 tablet by mouth every 4 (four) hours as needed for severe pain.   0  . sertraline (ZOLOFT) 50 MG tablet Take 50 mg by mouth at bedtime.    . sodium chloride (OCEAN) 0.65 % SOLN nasal spray Place 1 spray into both nostrils at bedtime as needed for congestion.    . solifenacin  (VESICARE) 5 MG tablet Take 5 mg by mouth daily.    . traZODone (DESYREL) 50 MG tablet Take 50 mg by mouth at bedtime.    . Misc. Devices (ROLLATOR ULTRA-LIGHT) MISC 1 each by Does not apply route daily. (Patient not taking: Reported on 02/12/2015) 1 each 0     Discharge Medications: Please see discharge summary for a list of discharge medications.  Relevant Imaging Results:  Relevant Lab Results:  Recent Labs    Additional Information    Kirandeep Fariss M, LCSW

## 2015-02-12 NOTE — Progress Notes (Signed)
Spoke with pt and her granddaughter re: SNF placement from the ED and they are agreeable to bed search in [place]  Pt has been at [facility] in the past, but has no preference for a choose of facility at this time.  Bed search has been initiated.  PT/OT evals are pending.  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  W/E CSW will f/u in am.

## 2015-02-12 NOTE — ED Notes (Signed)
Patient has a history of a fall 2 days ago. C/O increasing BLE weakness.  Daughter sent patient in because she could not take care of the patient at home.

## 2015-02-13 MED ORDER — OXYCODONE-ACETAMINOPHEN 5-325 MG PO TABS: 1.0000 | ORAL_TABLET | Freq: Four times a day (QID) | ORAL | Status: DC | PRN

## 2015-02-13 MED ORDER — DIAZEPAM 5 MG PO TABS: 5.0000 mg | ORAL_TABLET | Freq: Every day | ORAL | Status: DC

## 2015-02-13 NOTE — ED Notes (Signed)
PT w/pt.  

## 2015-02-13 NOTE — ED Notes (Signed)
Spoke w/pt's son, Merry Proud, and advised him of pt's acceptance to [facility] - advised he will contact his niece so may advise his sister (sister w/hard of hearing).

## 2015-02-13 NOTE — Clinical Social Work Placement (Signed)
   CLINICAL SOCIAL WORK PLACEMENT  NOTE  Date:  02/13/2015  Patient Details  Name: CLAUDETT OSTERLUND MRN: [id number] Date of Birth: 1926-08-14  Clinical Social Work is seeking post-discharge placement for this patient at the [place] level of care (*CSW will initial, date and re-position this form in  chart as items are completed):  Yes   Patient/family provided with [place] Work Department's list of facilities offering this level of care within the geographic area requested by the patient (or if unable, by the patient's family).  Yes   Patient/family informed of their freedom to choose among providers that offer the needed level of care, that participate in Medicare, Medicaid or managed care program needed by the patient, have an available bed and are willing to accept the patient.      Patient/family informed of [facility]'s ownership interest in [facility] and [facility], as well as of the fact that they are under no obligation to receive care at these facilities.  PASRR submitted to EDS on       PASRR number received on       Existing PASRR number confirmed on 02/13/15     FL2 transmitted to all facilities in geographic area requested by pt/family on       FL2 transmitted to all facilities within larger geographic area on 02/12/15     Patient informed that his/her managed care company has contracts with or will negotiate with certain facilities, including the following:        Yes   Patient/family informed of bed offers received.  Patient chooses bed at  [facility] )     Physician recommends and patient chooses bed at      Patient to be transferred to  [facility]) on 02/13/15.  Patient to be transferred to facility by  [facility])     Patient family notified on 02/13/15 of transfer.  Name of family member notified:  Lenward Chancellor      PHYSICIAN       Additional Comment:     _______________________________________________ Raymondo Band, LCSW 02/13/2015, 2:13 PM

## 2015-02-13 NOTE — Evaluation (Signed)
Physical Therapy Evaluation Patient Details Name: Misty Patterson MRN: [id number] DOB: Jan 07, 1927 Today's Date: 02/13/2015   History of Present Illness  Brought to ED due to repeated falls. Pt s/p C3-T1 decompression and fusion 01/28/15. PMHx- dementia, neuropathy, restless leg syndrome, osteoporosis, COPD    Clinical Impression  Patient is s/p above surgery 01/28/15 with multiple falls at home resulting in the deficits listed below (see PT Problem List). Patient clearly does not have enough support at home. Patient will benefit from skilled PT to increase their independence and safety with mobility (while adhering to their precautions) to allow discharge to the venue listed below.     Follow Up Recommendations SNF;Supervision/Assistance - 24 hour    Equipment Recommendations  None recommended by PT    Recommendations for Other Services       Precautions / Restrictions Precautions Precautions: Fall;Cervical Required Braces or Orthoses: Cervical Brace Cervical Brace: Hard collar;At all times Restrictions Weight Bearing Restrictions: No      Mobility  Bed Mobility Overal bed mobility: Needs Assistance Bed Mobility: Rolling;Sidelying to Sit;Sit to Supine Rolling: Supervision Sidelying to sit: Min assist;HOB elevated   Sit to supine: Min assist;HOB elevated   General bed mobility comments: pt unaware of proper technique to minimize strain on neck; even with cues, pt with difficulty following technique  Transfers Overall transfer level: Needs assistance Equipment used: 1 person hand held assist Transfers: Sit to/from Stand Sit to Stand: Min assist         General transfer comment: x 3 various height surfaces; assist to boost to stand and steady  Ambulation/Gait Ambulation/Gait assistance: Min assist Ambulation Distance (Feet): 40 Feet (seated rest, 40) Assistive device: 1 person hand held assist Gait Pattern/deviations: Step-through pattern;Decreased stride  length;Staggering left;Staggering right;Narrow base of support Gait velocity: ' Gait velocity interpretation: Below normal speed for age/gender General Gait Details: flexed/kyphotic posture, unsteady  Stairs            Wheelchair Mobility    Modified Rankin (Stroke Patients Only)       Balance Overall balance assessment: Needs assistance;History of Falls Sitting-balance support: No upper extremity supported;Feet supported Sitting balance-Leahy Scale: Fair     Standing balance support: Single extremity supported Standing balance-Leahy Scale: Poor                               Pertinent Vitals/Pain Pain Assessment: Faces Faces Pain Scale: Hurts little more Pain Location: neck; rectum Pain Descriptors / Indicators: Discomfort;Grimacing Pain Intervention(s): Limited activity within patient's tolerance;Monitored during session;Repositioned;Other (comment) (RN made aware)    Home Living Family/patient expects to be discharged to:: [place]: Children   Type of Home: House Home Access: Level entry     Home Layout: [place]: [place] - 2 wheels;Shower seat      Prior Function Level of Independence: Independent         Comments: per medical records (and pt) she was independent prior to initial fall that led to cervical surgery     Hand Dominance   Dominant Hand: Right    Extremity/Trunk Assessment   Upper Extremity Assessment: RUE deficits/detail;LUE deficits/detail RUE Deficits / Details: grossly weak, pincer grip 4/5     LUE Deficits / Details: grossly weak; pincer grip 3/5, poor finger extension all digits   Lower Extremity Assessment: RLE deficits/detail;LLE deficits/detail RLE Deficits / Details: AROM wfl except dorsiflexion to neutral; no incr tone noted; grossly  3+ to 4/5 throughout LLE Deficits / Details: AROM wfl except dorsiflexion to neutral; no incr tone noted; grossly 3+ to 4/5  throughout  Cervical / Trunk Assessment: Kyphotic;Other exceptions  Communication   Communication: No difficulties  Cognition Arousal/Alertness: Awake/alert Behavior During Therapy: WFL for tasks assessed/performed Overall Cognitive Status: No family/caregiver present to determine baseline cognitive functioning       Memory: Decreased recall of precautions              General Comments      Exercises        Assessment/Plan    PT Assessment Patient needs continued PT services  PT Diagnosis Difficulty walking;Generalized weakness;Acute pain   PT Problem List Decreased strength;Decreased balance;Decreased mobility;Decreased cognition;Decreased knowledge of use of DME;Decreased knowledge of precautions;Impaired sensation;Pain  PT Treatment Interventions DME instruction;Gait training;Functional mobility training;Therapeutic activities;Therapeutic exercise;Balance training;Cognitive remediation;Patient/family education   PT Goals (Current goals can be found in the Care Plan section) Acute Rehab PT Goals Patient Stated Goal: stop falling and get stronger PT Goal Formulation: With patient Time For Goal Achievement: 02/26/15 Potential to Achieve Goals: Good    Frequency Min 2X/week   Barriers to discharge Decreased caregiver support      Co-evaluation               End of Session Equipment Utilized During Treatment: Gait belt;Cervical collar Activity Tolerance: Patient tolerated treatment well Patient left: in bed;with call bell/phone within reach;with bed alarm set Nurse Communication: Mobility status;Other (comment) (needs to return to bathroom; ?constipated)    Functional Assessment Tool Used: clinical judgement Functional Limitation: Mobility: Walking and moving around Mobility: Walking and Moving Around Current Status [id number]): At least 40 percent but less than 60 percent impaired, limited or restricted Mobility: Walking and Moving Around Goal Status [phone]):  At least 1 percent but less than 20 percent impaired, limited or restricted    Time: 0742-0811 PT Time Calculation (min) (ACUTE ONLY): 29 min   Charges:   PT Evaluation $Initial PT Evaluation Tier I: 1 Procedure PT Treatments $Gait Training: 8-22 mins   PT G Codes:   PT G-Codes **NOT FOR INPATIENT CLASS** Functional Assessment Tool Used: clinical judgement Functional Limitation: Mobility: Walking and moving around Mobility: Walking and Moving Around Current Status [id number]): At least 40 percent but less than 60 percent impaired, limited or restricted Mobility: Walking and Moving Around Goal Status [phone]): At least 1 percent but less than 20 percent impaired, limited or restricted    Misty Patterson 02/13/2015, 8:35 AM Pager [phone]

## 2015-02-13 NOTE — ED Notes (Signed)
Breakfast tray set up for pt - pt states she does not have upper/lower dentures. No belongings noted in room - pt advised will ask family if they have them when they arrive or call.

## 2015-02-13 NOTE — Progress Notes (Signed)
CSW followed up with Juliann Pulse from [facility] regarding bed availability. CSW informed Juliann Pulse that insurance authorization is currently pending. Juliann Pulse advised CSW to speak with Admissions Director for the weekend Santiago Glad regarding possible bed on today. CSW followed up with Santiago Glad at [phone] but received no answer. Left voice message at 10:32am.   CSW will continue to follow and provide support while in hospital.    Lucius Conn, [place] Worker [facility] Ph: [phone]

## 2015-02-13 NOTE — Discharge Instructions (Signed)

## 2015-02-13 NOTE — ED Notes (Signed)
[facility] has arrived to transport pt. Pt asking daughter for glasses - daughter advised they do not have them d/t broke during one of her falls. Pt voiced understanding and states she then remembered.

## 2015-02-13 NOTE — ED Provider Notes (Signed)
17:30- . The patient has been transferred to a rehabilitation facility. I was asked by the nurse case manager, and social worker, to help with some medication prescriptions since she is at a new facility and they don't have them currently. They do have an FL-2 form, which does have the medication on it. Nurse case Freight forwarder. Recommend direct prescriptions for her Percocet and Valium since they are controlled substances. She feels that the rest of the medications can be given based on the FL-2 form. I wrote prescriptions for 10 Percocet, and 3 Valium. I can write additional prescriptions, should they be needed.  Daleen Bo, MD 02/13/15 [phone]

## 2015-02-13 NOTE — ED Notes (Signed)
Pt states feels much better after BM.

## 2015-02-13 NOTE — Progress Notes (Signed)
CSW made contact with Admissions Director Santiago Glad at [facility]. Patient has been accepted to their facility. Discharge Summary to be sent to facility once available. Patient can be transported via [facility] to facility around 2:00pm. CSW to inform patient and family. CSW will also arrange transportation for the patient. No further CSW needs reported at this time.   Lucius Conn, [facility] Ph: [phone]

## 2015-02-13 NOTE — ED Notes (Signed)
Pt c/o constipation - requested to lie on bed rather than sit up d/t "feel like I'm sitting on something on my bottom". Assisted pt w/lying on bed - warm blankets given.

## 2015-02-24 ENCOUNTER — Encounter (HOSPITAL_COMMUNITY): Payer: Self-pay | Admitting: Orthopedic Surgery

## 2015-03-16 DIAGNOSIS — M810 Age-related osteoporosis without current pathological fracture: Secondary | ICD-10-CM | POA: Diagnosis not present

## 2015-03-16 DIAGNOSIS — J449 Chronic obstructive pulmonary disease, unspecified: Secondary | ICD-10-CM | POA: Diagnosis not present

## 2015-03-16 DIAGNOSIS — F339 Major depressive disorder, recurrent, unspecified: Secondary | ICD-10-CM | POA: Diagnosis not present

## 2015-03-16 DIAGNOSIS — G4089 Other seizures: Secondary | ICD-10-CM | POA: Diagnosis not present

## 2015-03-16 DIAGNOSIS — F0390 Unspecified dementia without behavioral disturbance: Secondary | ICD-10-CM | POA: Diagnosis not present

## 2015-03-16 DIAGNOSIS — I129 Hypertensive chronic kidney disease with stage 1 through stage 4 chronic kidney disease, or unspecified chronic kidney disease: Secondary | ICD-10-CM | POA: Diagnosis not present

## 2015-03-16 DIAGNOSIS — N189 Chronic kidney disease, unspecified: Secondary | ICD-10-CM | POA: Diagnosis not present

## 2015-03-16 DIAGNOSIS — M6281 Muscle weakness (generalized): Secondary | ICD-10-CM | POA: Diagnosis not present

## 2015-03-16 DIAGNOSIS — R296 Repeated falls: Secondary | ICD-10-CM | POA: Diagnosis not present

## 2015-03-16 DIAGNOSIS — R2689 Other abnormalities of gait and mobility: Secondary | ICD-10-CM | POA: Diagnosis not present

## 2015-03-18 DIAGNOSIS — F339 Major depressive disorder, recurrent, unspecified: Secondary | ICD-10-CM | POA: Diagnosis not present

## 2015-03-18 DIAGNOSIS — F0390 Unspecified dementia without behavioral disturbance: Secondary | ICD-10-CM | POA: Diagnosis not present

## 2015-03-18 DIAGNOSIS — J449 Chronic obstructive pulmonary disease, unspecified: Secondary | ICD-10-CM | POA: Diagnosis not present

## 2015-03-18 DIAGNOSIS — M6281 Muscle weakness (generalized): Secondary | ICD-10-CM | POA: Diagnosis not present

## 2015-03-18 DIAGNOSIS — I129 Hypertensive chronic kidney disease with stage 1 through stage 4 chronic kidney disease, or unspecified chronic kidney disease: Secondary | ICD-10-CM | POA: Diagnosis not present

## 2015-03-18 DIAGNOSIS — R296 Repeated falls: Secondary | ICD-10-CM | POA: Diagnosis not present

## 2015-03-18 DIAGNOSIS — M810 Age-related osteoporosis without current pathological fracture: Secondary | ICD-10-CM | POA: Diagnosis not present

## 2015-03-18 DIAGNOSIS — R2689 Other abnormalities of gait and mobility: Secondary | ICD-10-CM | POA: Diagnosis not present

## 2015-03-18 DIAGNOSIS — G4089 Other seizures: Secondary | ICD-10-CM | POA: Diagnosis not present

## 2015-03-18 DIAGNOSIS — N189 Chronic kidney disease, unspecified: Secondary | ICD-10-CM | POA: Diagnosis not present

## 2015-03-24 DIAGNOSIS — J449 Chronic obstructive pulmonary disease, unspecified: Secondary | ICD-10-CM | POA: Diagnosis not present

## 2015-03-24 DIAGNOSIS — M810 Age-related osteoporosis without current pathological fracture: Secondary | ICD-10-CM | POA: Diagnosis not present

## 2015-03-24 DIAGNOSIS — N189 Chronic kidney disease, unspecified: Secondary | ICD-10-CM | POA: Diagnosis not present

## 2015-03-24 DIAGNOSIS — G4089 Other seizures: Secondary | ICD-10-CM | POA: Diagnosis not present

## 2015-03-24 DIAGNOSIS — I129 Hypertensive chronic kidney disease with stage 1 through stage 4 chronic kidney disease, or unspecified chronic kidney disease: Secondary | ICD-10-CM | POA: Diagnosis not present

## 2015-03-24 DIAGNOSIS — F339 Major depressive disorder, recurrent, unspecified: Secondary | ICD-10-CM | POA: Diagnosis not present

## 2015-03-24 DIAGNOSIS — R2689 Other abnormalities of gait and mobility: Secondary | ICD-10-CM | POA: Diagnosis not present

## 2015-03-24 DIAGNOSIS — M6281 Muscle weakness (generalized): Secondary | ICD-10-CM | POA: Diagnosis not present

## 2015-03-24 DIAGNOSIS — F0390 Unspecified dementia without behavioral disturbance: Secondary | ICD-10-CM | POA: Diagnosis not present

## 2015-03-24 DIAGNOSIS — R296 Repeated falls: Secondary | ICD-10-CM | POA: Diagnosis not present

## 2015-03-26 DIAGNOSIS — N189 Chronic kidney disease, unspecified: Secondary | ICD-10-CM | POA: Diagnosis not present

## 2015-03-26 DIAGNOSIS — G4089 Other seizures: Secondary | ICD-10-CM | POA: Diagnosis not present

## 2015-03-26 DIAGNOSIS — R296 Repeated falls: Secondary | ICD-10-CM | POA: Diagnosis not present

## 2015-03-26 DIAGNOSIS — I129 Hypertensive chronic kidney disease with stage 1 through stage 4 chronic kidney disease, or unspecified chronic kidney disease: Secondary | ICD-10-CM | POA: Diagnosis not present

## 2015-03-26 DIAGNOSIS — R2689 Other abnormalities of gait and mobility: Secondary | ICD-10-CM | POA: Diagnosis not present

## 2015-03-26 DIAGNOSIS — F0390 Unspecified dementia without behavioral disturbance: Secondary | ICD-10-CM | POA: Diagnosis not present

## 2015-03-26 DIAGNOSIS — J449 Chronic obstructive pulmonary disease, unspecified: Secondary | ICD-10-CM | POA: Diagnosis not present

## 2015-03-26 DIAGNOSIS — F339 Major depressive disorder, recurrent, unspecified: Secondary | ICD-10-CM | POA: Diagnosis not present

## 2015-03-26 DIAGNOSIS — M810 Age-related osteoporosis without current pathological fracture: Secondary | ICD-10-CM | POA: Diagnosis not present

## 2015-03-26 DIAGNOSIS — M6281 Muscle weakness (generalized): Secondary | ICD-10-CM | POA: Diagnosis not present

## 2015-04-07 DIAGNOSIS — M6281 Muscle weakness (generalized): Secondary | ICD-10-CM | POA: Diagnosis not present

## 2015-04-07 DIAGNOSIS — R2689 Other abnormalities of gait and mobility: Secondary | ICD-10-CM | POA: Diagnosis not present

## 2015-04-07 DIAGNOSIS — J449 Chronic obstructive pulmonary disease, unspecified: Secondary | ICD-10-CM | POA: Diagnosis not present

## 2015-04-07 DIAGNOSIS — F0390 Unspecified dementia without behavioral disturbance: Secondary | ICD-10-CM | POA: Diagnosis not present

## 2015-04-07 DIAGNOSIS — F339 Major depressive disorder, recurrent, unspecified: Secondary | ICD-10-CM | POA: Diagnosis not present

## 2015-04-07 DIAGNOSIS — N189 Chronic kidney disease, unspecified: Secondary | ICD-10-CM | POA: Diagnosis not present

## 2015-04-07 DIAGNOSIS — R296 Repeated falls: Secondary | ICD-10-CM | POA: Diagnosis not present

## 2015-04-07 DIAGNOSIS — I10 Essential (primary) hypertension: Secondary | ICD-10-CM | POA: Diagnosis not present

## 2015-04-07 DIAGNOSIS — M5090 Cervical disc disorder, unspecified, unspecified cervical region: Secondary | ICD-10-CM | POA: Diagnosis not present

## 2015-04-07 DIAGNOSIS — G4089 Other seizures: Secondary | ICD-10-CM | POA: Diagnosis not present

## 2015-04-07 DIAGNOSIS — I129 Hypertensive chronic kidney disease with stage 1 through stage 4 chronic kidney disease, or unspecified chronic kidney disease: Secondary | ICD-10-CM | POA: Diagnosis not present

## 2015-04-07 DIAGNOSIS — R269 Unspecified abnormalities of gait and mobility: Secondary | ICD-10-CM | POA: Diagnosis not present

## 2015-04-07 DIAGNOSIS — M810 Age-related osteoporosis without current pathological fracture: Secondary | ICD-10-CM | POA: Diagnosis not present

## 2015-04-16 DIAGNOSIS — F339 Major depressive disorder, recurrent, unspecified: Secondary | ICD-10-CM | POA: Diagnosis not present

## 2015-04-16 DIAGNOSIS — M810 Age-related osteoporosis without current pathological fracture: Secondary | ICD-10-CM | POA: Diagnosis not present

## 2015-04-16 DIAGNOSIS — N189 Chronic kidney disease, unspecified: Secondary | ICD-10-CM | POA: Diagnosis not present

## 2015-04-16 DIAGNOSIS — J449 Chronic obstructive pulmonary disease, unspecified: Secondary | ICD-10-CM | POA: Diagnosis not present

## 2015-04-16 DIAGNOSIS — F0390 Unspecified dementia without behavioral disturbance: Secondary | ICD-10-CM | POA: Diagnosis not present

## 2015-04-16 DIAGNOSIS — M6281 Muscle weakness (generalized): Secondary | ICD-10-CM | POA: Diagnosis not present

## 2015-04-16 DIAGNOSIS — R2689 Other abnormalities of gait and mobility: Secondary | ICD-10-CM | POA: Diagnosis not present

## 2015-04-16 DIAGNOSIS — R296 Repeated falls: Secondary | ICD-10-CM | POA: Diagnosis not present

## 2015-04-16 DIAGNOSIS — I129 Hypertensive chronic kidney disease with stage 1 through stage 4 chronic kidney disease, or unspecified chronic kidney disease: Secondary | ICD-10-CM | POA: Diagnosis not present

## 2015-04-16 DIAGNOSIS — G4089 Other seizures: Secondary | ICD-10-CM | POA: Diagnosis not present

## 2015-04-19 DIAGNOSIS — G959 Disease of spinal cord, unspecified: Secondary | ICD-10-CM | POA: Diagnosis not present

## 2015-04-20 DIAGNOSIS — N189 Chronic kidney disease, unspecified: Secondary | ICD-10-CM | POA: Diagnosis not present

## 2015-04-20 DIAGNOSIS — J449 Chronic obstructive pulmonary disease, unspecified: Secondary | ICD-10-CM | POA: Diagnosis not present

## 2015-04-20 DIAGNOSIS — M810 Age-related osteoporosis without current pathological fracture: Secondary | ICD-10-CM | POA: Diagnosis not present

## 2015-04-20 DIAGNOSIS — G4089 Other seizures: Secondary | ICD-10-CM | POA: Diagnosis not present

## 2015-04-20 DIAGNOSIS — F0390 Unspecified dementia without behavioral disturbance: Secondary | ICD-10-CM | POA: Diagnosis not present

## 2015-04-20 DIAGNOSIS — R2689 Other abnormalities of gait and mobility: Secondary | ICD-10-CM | POA: Diagnosis not present

## 2015-04-20 DIAGNOSIS — I129 Hypertensive chronic kidney disease with stage 1 through stage 4 chronic kidney disease, or unspecified chronic kidney disease: Secondary | ICD-10-CM | POA: Diagnosis not present

## 2015-04-20 DIAGNOSIS — M6281 Muscle weakness (generalized): Secondary | ICD-10-CM | POA: Diagnosis not present

## 2015-04-20 DIAGNOSIS — F339 Major depressive disorder, recurrent, unspecified: Secondary | ICD-10-CM | POA: Diagnosis not present

## 2015-04-20 DIAGNOSIS — R296 Repeated falls: Secondary | ICD-10-CM | POA: Diagnosis not present

## 2015-04-27 DIAGNOSIS — N183 Chronic kidney disease, stage 3 (moderate): Secondary | ICD-10-CM | POA: Diagnosis not present

## 2015-04-27 DIAGNOSIS — I10 Essential (primary) hypertension: Secondary | ICD-10-CM | POA: Diagnosis not present

## 2015-04-27 DIAGNOSIS — Z79899 Other long term (current) drug therapy: Secondary | ICD-10-CM | POA: Diagnosis not present

## 2015-04-27 DIAGNOSIS — I739 Peripheral vascular disease, unspecified: Secondary | ICD-10-CM | POA: Diagnosis not present

## 2015-04-27 DIAGNOSIS — F0390 Unspecified dementia without behavioral disturbance: Secondary | ICD-10-CM | POA: Diagnosis not present

## 2015-04-28 ENCOUNTER — Other Ambulatory Visit: Payer: Self-pay

## 2015-04-28 DIAGNOSIS — I10 Essential (primary) hypertension: Secondary | ICD-10-CM

## 2015-04-28 NOTE — Patient Outreach (Signed)
[facility]) Care Management  04/28/2015  Misty Patterson 11-Apr-1926 [id number]   Telephone Screen  Referral Date: 04/27/15 Referral Source: MD office(Dr. Lysle Rubens) Referral Reason: "HTN, dementia, multiple medications/polypharmacy"   Outreach attempt #1 to patient. Patient reached. Screening completed.   Social: Patient resides in her home alone. She states that her son and dtr both live nearby and assist with her care needs. She voices being independent with ADLs but needs assistance with IADLs. She does drive herself around some times or her children transport her. She denies any recent falls. However, she complains of "being very wobbly:. She has walker and cane in the home and reports using as needed.   Conditions: Patient has h/o: HTN, HLD, ovarian CA, depression, COPD, dementia, chronic back pain and GERD. She admits to becoming increasingly forgetful and having memory issues. She states that her BP is WNL. She does have machine in the home but does not check it often. Patient reports she was getting [place] services(unable to recall agency name) and states that today was the last day. She is concerned about not having any in home support to help her with her medical needs.  Medications: patient voices taking more than 10 meds. She reports difficulty obtaining meds at time due to her fixed income. Per patient [facility] was filling weekly med planner. She is unable to do so on her own and is not use how she will manage her meds now.  Consent: Patient gave verbal consent for [facility] services.    Plan: RN CM will notify [facility] administrative assistant of case status. RN CM will send [facility] community referral for further in home eval and assessment. RN CM will send [place] referral for polypharmacy med review and possible med assistance.  Enzo Montgomery, RN,BSN,CCM [place] Management Telephonic Care Management Coordinator Direct Phone: [phone] Toll Free:  [phone] Fax: [phone]

## 2015-04-30 ENCOUNTER — Other Ambulatory Visit: Payer: Self-pay | Admitting: Neurology

## 2015-05-03 ENCOUNTER — Encounter: Payer: Self-pay | Admitting: Neurology

## 2015-05-03 ENCOUNTER — Other Ambulatory Visit: Payer: Self-pay | Admitting: *Deleted

## 2015-05-03 MED ORDER — DONEPEZIL HCL 5 MG PO TABS
ORAL_TABLET | ORAL | Status: DC
Start: 2015-05-03 — End: 2015-06-04

## 2015-05-03 NOTE — Telephone Encounter (Signed)
Rx sent 

## 2015-05-05 ENCOUNTER — Other Ambulatory Visit: Payer: Self-pay | Admitting: *Deleted
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
## 2015-05-11 ENCOUNTER — Encounter: Payer: Self-pay | Admitting: *Deleted

## 2015-05-11 ENCOUNTER — Other Ambulatory Visit: Payer: Self-pay

## 2015-05-11 ENCOUNTER — Other Ambulatory Visit: Payer: Self-pay | Admitting: *Deleted

## 2015-05-11 VITALS — BP 140/60 | HR 50 | Resp 14 | Ht 65.0 in | Wt 145.0 lb

## 2015-05-11 DIAGNOSIS — F03918 Unspecified dementia, unspecified severity, with other behavioral disturbance: Secondary | ICD-10-CM

## 2015-05-11 DIAGNOSIS — F0391 Unspecified dementia with behavioral disturbance: Secondary | ICD-10-CM
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
## 2015-05-13 ENCOUNTER — Encounter: Payer: Self-pay | Admitting: *Deleted

## 2015-05-13 NOTE — Patient Outreach (Signed)
[facility]) Care Management  [place]   05/11/15   Misty Patterson 26-Nov-1926 [id number]  Subjective: Misty Patterson is an 82 year old female who was referred to me for medication management and adherence monitoring.  I am completing a home visit with Misty Naas, RN, nurse care manager.  Misty Patterson has all her medications with her today.  She has a pile of bottles that she stated needs to be refilled.  When I review her bottles, she actually has all of them and only the Vesicare needs to be refilled.  I called the pharmacy and [facility] stated it cannot be refilled until 05/20/15 even though it was filled on 04/16/15.  Prior to my visit, [facility] was filling Misty Patterson's pill box.  Misty Patterson is no longer involved in Misty Patterson's care.  I asked how she was taking her medications and she stated she was just reading the bottles.  She stated she had only taken her atenolol and hydrochlorothiazide this morning.  I asked if she had any medications anywhere else in the home and she showed me a cabinet where she kept her vitamins.  I reviewed the vitamins in the cabinet and most of them were expired.  I removed all the expired vitamins from the cabinet and took them out of the house to dispose of them properly.    Objective:   Current Medications: Current Outpatient Prescriptions  Medication Sig Dispense Refill  . Acetaminophen-Codeine 300-30 MG tablet Take 1 tablet by mouth 3 (three) times daily as needed for pain.    Marland Kitchen albuterol (PROVENTIL HFA;VENTOLIN HFA) 108 (90 BASE) MCG/ACT inhaler Inhale 2 puffs into the lungs every 4 (four) hours as needed for wheezing or shortness of breath.     Marland Kitchen amLODipine (NORVASC) 5 MG tablet Take 5 mg by mouth 2 (two) times daily.     Marland Kitchen atenolol (TENORMIN) 25 MG tablet Take 12.5 mg by mouth daily.    . Ca Carb-Glucos-Chond-Phelloden (FLEXI JOINT RELIEF PLUS PO) Take 1 tablet by mouth daily.    . clopidogrel (PLAVIX) 75 MG tablet Take 75 mg by mouth  daily.    . cyclobenzaprine (FLEXERIL) 5 MG tablet Take 5 mg by mouth every 8 (eight) hours as needed for muscle spasms.    . diazepam (VALIUM) 5 MG tablet Take 1 tablet (5 mg total) by mouth at bedtime. 3 tablet 0  . donepezil (ARICEPT) 5 MG tablet Take 1 tablet daily for one month, then increase to 2 tablets. (Patient taking differently: Take 2 tablets daily.) 60 tablet 3  . famotidine (PEPCID) 20 MG tablet Take 20 mg by mouth daily.   3  . gabapentin (NEURONTIN) 300 MG capsule Take 300 mg by mouth 2 (two) times daily.     . hydrochlorothiazide (HYDRODIURIL) 25 MG tablet Take 25 mg by mouth daily.     . meloxicam (MOBIC) 7.5 MG tablet Take 7.5 mg by mouth daily.    . Multiple Vitamin (MULTIVITAMIN WITH MINERALS) TABS tablet Take 1 tablet by mouth daily.    Marland Kitchen OVER THE COUNTER MEDICATION Take 2 capsules by mouth daily. Ultimate Colon Care Formula - herbal supplement    . Resveratrol-Quercetin (RESVERATROL PLUS PO) Take 1 capsule by mouth daily. Omega Q plus    . sertraline (ZOLOFT) 50 MG tablet Take 50 mg by mouth at bedtime.    . sodium chloride (OCEAN) 0.65 % SOLN nasal spray Place 1 spray into both nostrils at bedtime as needed  for congestion.    . solifenacin (VESICARE) 5 MG tablet Take 5 mg by mouth daily.    . traZODone (DESYREL) 50 MG tablet Take 50 mg by mouth at bedtime.    . B Complex-C (B-COMPLEX WITH VITAMIN C) tablet Take 1 tablet by mouth daily. Reported on 05/13/2015    . cholecalciferol (VITAMIN D) 1000 UNITS tablet Take 2,000 Units by mouth daily. Reported on 05/13/2015    . Melatonin-Pyridoxine 3-1 MG TABS Take 1 tablet by mouth at bedtime. Reported on 05/13/2015    . Misc. Devices (ROLLATOR ULTRA-LIGHT) MISC 1 each by Does not apply route daily. (Patient not taking: Reported on 02/12/2015) 1 each 0   No current facility-administered medications for this visit.    Functional Status: In your present state of health, do you have any difficulty performing the following activities:  05/11/2015 01/29/2015  Hearing? N N  Vision? N N  Difficulty concentrating or making decisions? Y N  Walking or climbing stairs? Y Y  Dressing or bathing? N Y  Doing errands, shopping? Misty Patterson  Preparing Food and eating ? N -  Using the Toilet? N -  In the past six months, have you accidently leaked urine? N -  Do you have problems with loss of bowel control? N -  Managing your Medications? Y -  Managing your Finances? Y -  Housekeeping or managing your Housekeeping? Y -    Fall/Depression Screening: PHQ 2/9 Scores 05/11/2015  PHQ - 2 Score 0    Assessment:  Drugs sorted by system:  Neurologic/Psychologic: diazepam, donepezil, sertraline, trazodone  Cardiovascular: amlodipine, atenolol, clopidogrel, hydrochlorothiazide  Pulmonary/Allergy: albuterol  Gastrointestinal: famotidine, meloxicam  Endocrine: none  Renal: none  Topical: none  Pain: acetaminophen with codeine, cyclobenzaprine, gabapentin  Vitamins/Minerals: multivatimin  Infectious Diseases: none  Miscellaneous: flex joint, omega 3, saline nasal spray, solifenacine   Duplications in therapy:  None Gaps in therapy:  None Medications to avoid in the elderly:  Diazepam and Cyclobenzaprine (increase risk of sedation and falls) Drug interactions:  No major drug interactions or problems Other issues noted:  She is taking gabapentin three times a day and famotidine twice a day and her KPN list and EPIC list state Gabapentin is twice a day and famotidine is once a day.    Plan: 1.  I filled Misty Patterson pill box based on the [facility] [place] plan that was left for Misty Patterson to follow.  I will need to get clarification on Gabapentin and famotidine.  If Gabapentin was twice a day, it would increase her adherence.   2.  I removed all the expired medications from Misty Patterson's house to prevent her from taking them.  She had medications that had expired in 1997.   3.  I will follow up in one week to monitor her adherence and fill  her pill box up again.     THN CM Care Plan Problem One        Most Recent Value   Care Plan Problem One  Medication Adherence   Role Documenting the Problem One  Clinical Pharmacist   Care Plan for Problem One  Active   THN Long Term Goal (31-90 days)  Ms. Czarnik will be adherent to her medications 80% of the time evident by review of her pill box review by the pharmacist or nurse over the next 90 days   [place] Term Goal Start Date  05/11/15   Interventions for Problem One Long Term Goal  I  filled her pill box and educated her on the importance of taking her medications.  I discussed strategies on how to remember to taked her medications.  I changed her from taking medications four times a day to taking them three times a day.    THN CM Short Term Goal #1 (0-30 days)  Ms. Pacific will be adherent to her medications 80% of the time over the next 30 days evident by review of her pill box by the pharmacist over the next 30 days   THN CM Short Term Goal #1 Start Date  05/11/15   Interventions for Short Term Goal #1  I filled Ms. Casselman's pill box for a week and changed her from having four times a day medications to three times a day medications.  I instructed her on the importance of taking her medications daily.       Deanne Coffer, PharmD, [place] [phone]

## 2015-05-18 ENCOUNTER — Other Ambulatory Visit: Payer: Self-pay

## 2015-05-19 ENCOUNTER — Other Ambulatory Visit: Payer: Self-pay | Admitting: *Deleted

## 2015-05-19 NOTE — Patient Outreach (Signed)
[facility]) Care Management  [place]   05/18/15  HAILYNN MUSSELWHITE [birth year] [id number]  Subjective: Ms. Arabie is an 82 year old female who I have just begun to see for medication adherence monitoring.  I am completing a home visit today to determine her adherence over the last week.  She took all of her medications for 100% adherence.  She stated it was a lot easier not worrying about that evening dose.  I stated I would like to fill her a 2 week supply of medications to see how she does.  She is a little hesitant but she is willing.  Unfortunately, she does not have enough medications to fill pill boxes for 2 weeks.  I will have to call in some of her medications for refills.  I discussed a pharmacy benefit with her.  She can have her medications filled in pill packs through mail order.  This would eliminate the need for someone to have to fill pill boxes for her.  She stated she is interested in learning more about this.  Objective:   Current Medications: Current Outpatient Prescriptions  Medication Sig Dispense Refill  . Acetaminophen-Codeine 300-30 MG tablet Take 1 tablet by mouth 3 (three) times daily as needed for pain.    Marland Kitchen albuterol (PROVENTIL HFA;VENTOLIN HFA) 108 (90 BASE) MCG/ACT inhaler Inhale 2 puffs into the lungs every 4 (four) hours as needed for wheezing or shortness of breath.     Marland Kitchen amLODipine (NORVASC) 5 MG tablet Take 5 mg by mouth 2 (two) times daily.     Marland Kitchen atenolol (TENORMIN) 25 MG tablet Take 12.5 mg by mouth daily.    . Ca Carb-Glucos-Chond-Phelloden (FLEXI JOINT RELIEF PLUS PO) Take 1 tablet by mouth daily.    . clopidogrel (PLAVIX) 75 MG tablet Take 75 mg by mouth daily.    . cyclobenzaprine (FLEXERIL) 5 MG tablet Take 5 mg by mouth every 8 (eight) hours as needed for muscle spasms.    . diazepam (VALIUM) 5 MG tablet Take 1 tablet (5 mg total) by mouth at bedtime. 3 tablet 0  . donepezil (ARICEPT) 5 MG tablet Take 1 tablet daily for one  month, then increase to 2 tablets. (Patient taking differently: Take 2 tablets daily.) 60 tablet 3  . famotidine (PEPCID) 20 MG tablet Take 20 mg by mouth daily.   3  . gabapentin (NEURONTIN) 300 MG capsule Take 300 mg by mouth 2 (two) times daily.     . hydrochlorothiazide (HYDRODIURIL) 25 MG tablet Take 25 mg by mouth daily.     . meloxicam (MOBIC) 7.5 MG tablet Take 7.5 mg by mouth daily.    . Multiple Vitamin (MULTIVITAMIN WITH MINERALS) TABS tablet Take 1 tablet by mouth daily.    Marland Kitchen OVER THE COUNTER MEDICATION Take 2 capsules by mouth daily as needed. Ultimate Colon Care Formula - herbal supplement    . Resveratrol-Quercetin (RESVERATROL PLUS PO) Take 1 capsule by mouth daily. Omega Q plus    . sertraline (ZOLOFT) 50 MG tablet Take 50 mg by mouth at bedtime.    . sodium chloride (OCEAN) 0.65 % SOLN nasal spray Place 1 spray into both nostrils at bedtime as needed for congestion.    . solifenacin (VESICARE) 5 MG tablet Take 5 mg by mouth daily.    . traZODone (DESYREL) 50 MG tablet Take 50 mg by mouth at bedtime.    . B Complex-C (B-COMPLEX WITH VITAMIN C) tablet Take 1 tablet by mouth  daily. Reported on 05/18/2015    . cholecalciferol (VITAMIN D) 1000 UNITS tablet Take 2,000 Units by mouth daily. Reported on 05/18/2015    . Melatonin-Pyridoxine 3-1 MG TABS Take 1 tablet by mouth at bedtime. Reported on 05/18/2015    . Misc. Devices (ROLLATOR ULTRA-LIGHT) MISC 1 each by Does not apply route daily. (Patient not taking: Reported on 02/12/2015) 1 each 0   No current facility-administered medications for this visit.    Functional Status: In your present state of health, do you have any difficulty performing the following activities: 05/11/2015 01/29/2015  Hearing? N N  Vision? N N  Difficulty concentrating or making decisions? Y N  Walking or climbing stairs? Y Y  Dressing or bathing? N Y  Doing errands, shopping? [name]  Preparing Food and eating ? N -  Using the Toilet? N -  In the past six  months, have you accidently leaked urine? N -  Do you have problems with loss of bowel control? N -  Managing your Medications? Y -  Managing your Finances? Y -  Housekeeping or managing your Housekeeping? Y -    Fall/Depression Screening: PHQ 2/9 Scores 05/11/2015  PHQ - 2 Score 0    Assessment: Medication adherence:  She was 100% adherent to her medications.  She does not have the support to have some one fill her pill box on a weekly basis.  Plan: 1.  I will return on Friday, May 21, 2015 and fill her pill boxes for 2 weeks.  This will allow me to determine if she can be adherent to her medications for longer than a week.  2.  I will investigate how I can sign her up to the mail order program that would send pill packs.  I will bring her a sample of what the pill packs would look like.  3.  I will inform Reginia Naas, RN, nurse care manager of my findings and when I will be following up.   Deanne Coffer, PharmD, [place] [phone]

## 2015-05-19 NOTE — Patient Outreach (Signed)
[facility]) Care Management  05/19/2015  Misty Patterson Nov 23, 1926 [id number]  Patient referred to this social worker to provide resources for transportation to medical appointments.   Phone call to patient made to assess transportation needs.  Phone just rang, no message able to be left for a return call.   Sheralyn Boatman [facility] Care Management [phone]

## 2015-05-20 ENCOUNTER — Other Ambulatory Visit: Payer: Self-pay | Admitting: *Deleted

## 2015-05-20 NOTE — Patient Outreach (Signed)
[place] [facility]) Care Management  05/20/2015  CHANIELLE MEURER 1926/05/03 [id number]  Unsuccessful telephone outreach attempt to contact Ms. Driscoll to complete Risk Assessment and schedule next [place] home visit.  Unable to leave VM msg, as phone rang without answer or outgoing VM option.  Plan:    Will re-attempt telephone contact with Ms. Goding tomorrow to complete Risk Assessment and schedule next home visit.  [name], RN, BSN, [employer] [facility] Care Management  [phone]

## 2015-05-21 ENCOUNTER — Other Ambulatory Visit: Payer: Self-pay

## 2015-05-21 ENCOUNTER — Other Ambulatory Visit: Payer: Self-pay | Admitting: *Deleted

## 2015-05-21 NOTE — Patient Outreach (Signed)
[facility]) Care Management  05/21/2015  DASHANA GROOVER 08-31-1926 [id number]   Misty Patterson is an 82 y.o. female referred by her PCP, Dr. Wenda Low for medication management and disease management for dementia and hypertension.  She has been working [employer], [place], on medication management and Arrie Aran has been filling her pill box for her.  [name] has noted that [name] has successfully complied with her medicine regimen as prescribed, as a result of her assistance with filling of the patients pill box.  Patient confirmed that she has an appointment with [name] this morning to have her pill boxes re-filled. I provided [name] with positive reinforcement for complying to her medicine schedule now that her pill boxes are being properly managed and filled.  [name] reported today that she is feeling good and "pretty much normal."  I shared with her that I had involved [place], [facility] CSW, to assist with transportation issues, and that Chrystal has attempted to contact Ms. Wilbourne without success.  [name] said that she "goes outside in the afternoon, and sometimes doesn't hear the phone."  She expressed that she is usually by the phone in the morning; I will reach out to Chrystal to inform.    I asked [name] if she had been able to obtain transportation to her upcoming PCP appointment, scheduled for June 03, 2015.  [name] said that she is going to ask her daughter, who lives in [place], [place], to take her, but added, "I don't know if she can or not, she is sicker than I am."  I let [name] know that I would reach out to Chrystal and ask her to call her during the morning hours to assist with this issue.  [name] reported no other issues or problems during this telephone outreach, and we planned our next home visit for Monday, June 07, 2015 at 10:30 am.  I will notify Chrystal, [facility] CSW of this scheduled visit to see if her schedule allows a joint  visit.  Plan:  [name] will continue taking her medicine appropriately from pill boxes being filled by [facility] Pharmacist Ms. Abrahamsen will work on options with her family members for transportation to her PCP appointment on June 03, 2015. I will communicate with CSW to relay patient preferred time to receive follow up call, and also to see if a joint visit between [name] and [name] can be planned for Monday, March 27 at 10:30 am Next home visit with [place] scheduled for Monday, March 27 at 10:30 am  [name], RN, BSN, [employer] Coordinator [facility] Care Management  [phone]

## 2015-05-22 NOTE — Patient Outreach (Signed)
[facility]) Care Management  [place]  05/21/15  Misty Patterson [birth year] [id number]  Subjective: Misty Patterson is an 82 year old female who I have just begun to see for medication adherence monitoring. I am completing a home visit today to determine her adherence over the last week. She took all of her medications for 100% adherence. Today I am here to fill her pill box for a 2 week supply.  She has all her medications except for donepezil which the pharmacy did not fill.  I called the pharmacy and they will fill it today.  Misty Patterson stated she will have someone pick up the medication this weekend.  Objective:   Current Medications:  Current Outpatient Prescriptions  Medication Sig Dispense Refill  . Acetaminophen-Codeine 300-30 MG tablet Take 1 tablet by mouth 3 (three) times daily as needed for pain.    Marland Kitchen albuterol (PROVENTIL HFA;VENTOLIN HFA) 108 (90 BASE) MCG/ACT inhaler Inhale 2 puffs into the lungs every 4 (four) hours as needed for wheezing or shortness of breath.     Marland Kitchen amLODipine (NORVASC) 5 MG tablet Take 5 mg by mouth 2 (two) times daily.     Marland Kitchen atenolol (TENORMIN) 25 MG tablet Take 12.5 mg by mouth daily.    . Ca Carb-Glucos-Chond-Phelloden (FLEXI JOINT RELIEF PLUS PO) Take 1 tablet by mouth daily.    . clopidogrel (PLAVIX) 75 MG tablet Take 75 mg by mouth daily.    . cyclobenzaprine (FLEXERIL) 5 MG tablet Take 5 mg by mouth every 8 (eight) hours as needed for muscle spasms.    . diazepam (VALIUM) 5 MG tablet Take 1 tablet (5 mg total) by mouth at bedtime. 3 tablet 0  . donepezil (ARICEPT) 5 MG tablet Take 1 tablet daily for one month, then increase to 2 tablets. (Patient taking differently: Take 2 tablets daily.) 60 tablet 3  . famotidine (PEPCID) 20 MG tablet Take 20 mg by mouth daily.   3  . gabapentin (NEURONTIN) 300 MG capsule Take 300 mg by mouth 2 (two) times daily.     . hydrochlorothiazide (HYDRODIURIL) 25 MG tablet Take 25 mg by mouth daily.      . meloxicam (MOBIC) 7.5 MG tablet Take 7.5 mg by mouth daily.    . Multiple Vitamin (MULTIVITAMIN WITH MINERALS) TABS tablet Take 1 tablet by mouth daily.    Marland Kitchen Resveratrol-Quercetin (RESVERATROL PLUS PO) Take 1 capsule by mouth daily. Omega Q plus    . sertraline (ZOLOFT) 50 MG tablet Take 50 mg by mouth at bedtime.    . sodium chloride (OCEAN) 0.65 % SOLN nasal spray Place 1 spray into both nostrils at bedtime as needed for congestion.    . solifenacin (VESICARE) 5 MG tablet Take 5 mg by mouth daily.    . traZODone (DESYREL) 50 MG tablet Take 50 mg by mouth at bedtime.    . B Complex-C (B-COMPLEX WITH VITAMIN C) tablet Take 1 tablet by mouth daily. Reported on 05/21/2015    . cholecalciferol (VITAMIN D) 1000 UNITS tablet Take 2,000 Units by mouth daily. Reported on 05/21/2015    . Melatonin-Pyridoxine 3-1 MG TABS Take 1 tablet by mouth at bedtime. Reported on 05/21/2015    . Misc. Devices (ROLLATOR ULTRA-LIGHT) MISC 1 each by Does not apply route daily. (Patient not taking: Reported on 05/21/2015) 1 each 0  . OVER THE COUNTER MEDICATION Take 2 capsules by mouth daily as needed. Ultimate Colon Care Formula - herbal supplement  No current facility-administered medications for this visit.    Functional Status: In your present state of health, do you have any difficulty performing the following activities: 05/11/2015 01/29/2015  Hearing? N N  Vision? N N  Difficulty concentrating or making decisions? Y N  Walking or climbing stairs? Y Y  Dressing or bathing? N Y  Doing errands, shopping? [name]  Preparing Food and eating ? N -  Using the Toilet? N -  In the past six months, have you accidently leaked urine? N -  Do you have problems with loss of bowel control? N -  Managing your Medications? Y -  Managing your Finances? Y -  Housekeeping or managing your Housekeeping? Y -    Fall/Depression Screening: PHQ 2/9 Scores 05/11/2015  PHQ - 2 Score 0    Assessment: Medication adherence:   She is 100% adherent with her medication if someone is able to fill the pill box for her.   Plan: 1.  I fill her pill box for 2 weeks with the exception of donepezil.  She will have someone pick up the medication and fill the pill box for her.  2.  I showed her the pill pack and she is interested in using it in the future.  I will set her up for it the next time I meet with her.  3.  I will follow up with her in 2 weeks to monitor her adherence and set her up for pill pack.    Deanne Coffer, PharmD, [place] [phone]

## 2015-06-03 DIAGNOSIS — Z1389 Encounter for screening for other disorder: Secondary | ICD-10-CM | POA: Diagnosis not present

## 2015-06-03 DIAGNOSIS — R739 Hyperglycemia, unspecified: Secondary | ICD-10-CM | POA: Diagnosis not present

## 2015-06-03 DIAGNOSIS — J449 Chronic obstructive pulmonary disease, unspecified: Secondary | ICD-10-CM | POA: Diagnosis not present

## 2015-06-03 DIAGNOSIS — N183 Chronic kidney disease, stage 3 (moderate): Secondary | ICD-10-CM | POA: Diagnosis not present

## 2015-06-03 DIAGNOSIS — K219 Gastro-esophageal reflux disease without esophagitis: Secondary | ICD-10-CM | POA: Diagnosis not present

## 2015-06-03 DIAGNOSIS — C569 Malignant neoplasm of unspecified ovary: Secondary | ICD-10-CM | POA: Diagnosis not present

## 2015-06-03 DIAGNOSIS — I10 Essential (primary) hypertension: Secondary | ICD-10-CM | POA: Diagnosis not present

## 2015-06-03 DIAGNOSIS — M810 Age-related osteoporosis without current pathological fracture: Secondary | ICD-10-CM | POA: Diagnosis not present

## 2015-06-03 DIAGNOSIS — Z0000 Encounter for general adult medical examination without abnormal findings: Secondary | ICD-10-CM | POA: Diagnosis not present

## 2015-06-03 DIAGNOSIS — I739 Peripheral vascular disease, unspecified: Secondary | ICD-10-CM | POA: Diagnosis not present

## 2015-06-03 DIAGNOSIS — E782 Mixed hyperlipidemia: Secondary | ICD-10-CM | POA: Diagnosis not present

## 2015-06-03 DIAGNOSIS — R32 Unspecified urinary incontinence: Secondary | ICD-10-CM | POA: Diagnosis not present

## 2015-06-04 ENCOUNTER — Other Ambulatory Visit: Payer: Self-pay

## 2015-06-04 ENCOUNTER — Other Ambulatory Visit: Payer: Self-pay | Admitting: *Deleted

## 2015-06-04 NOTE — Patient Outreach (Signed)
[place] [facility]) Care Management  06/04/2015  KYNNEDY CODY 1926-11-30 [id number]   In Basket message received from [facility] stating that patient called to cancel the home visit scheduled for Monday, 06/07/15.  Per message, patient had a medical appointment that she had previously scheduled.   Plan:  This Education officer, museum will call patient to re-schedule initial home visit.   Sheralyn Boatman [facility] Care Management [phone]

## 2015-06-05 NOTE — Patient Outreach (Cosign Needed)
[place] [facility]) Care Management  [place]   06/04/2015  Misty Patterson 16-Feb-1927 [id number]  Subjective: Misty Patterson is an 82 year old female who I am have been following for medication adherence.  She has all her medications today except for Gabapentin.  She has lost her second bottle of Gabapentin which would have taken her to the middle of April.  She went to Dr. Lysle Rubens yesterday.  He completed her Annual Wellness Visit.  He wrote a letter stating she can drive to the grocery store and post office and that is all.  She has to have someone else drive her everywhere else.  She has no concerns about her medications and she is not experiencing any side effects. She would like to use [facility] to fill her prescriptions and to replace me having to fill her pill box every 2 weeks.  I called and they have to have an email address to set up an account.  Misty Patterson does not have an email address.  Her daughter does.  She will discuss this with her on Monday, June 07, 2015.  While I was completing my home visit, Dr. Glenna Durand nurse called to inform Misty Patterson her cholesterol was elevated and atorvastatin 10 mg daily will need to be started.    Objective:   Current Medications: Current Outpatient Prescriptions  Medication Sig Dispense Refill  . Acetaminophen-Codeine 300-30 MG tablet Take 1 tablet by mouth 3 (three) times daily as needed for pain.    Marland Kitchen albuterol (PROVENTIL HFA;VENTOLIN HFA) 108 (90 BASE) MCG/ACT inhaler Inhale 2 puffs into the lungs every 4 (four) hours as needed for wheezing or shortness of breath.     Marland Kitchen amLODipine (NORVASC) 5 MG tablet Take 5 mg by mouth 2 (two) times daily.     Marland Kitchen atenolol (TENORMIN) 25 MG tablet Take 12.5 mg by mouth daily.    Marland Kitchen atorvastatin (LIPITOR) 10 MG tablet Take 10 mg by mouth daily.    . B Complex-C (B-COMPLEX WITH VITAMIN C) tablet Take 1 tablet by mouth daily. Reported on 05/21/2015    . Ca Carb-Glucos-Chond-Phelloden (FLEXI JOINT RELIEF  PLUS PO) Take 1 tablet by mouth daily.    . Cholecalciferol (VITAMIN D3) 10000 units capsule Take 10,000 Units by mouth once a week.    . clopidogrel (PLAVIX) 75 MG tablet Take 75 mg by mouth daily.    . cyclobenzaprine (FLEXERIL) 5 MG tablet Take 5 mg by mouth every 8 (eight) hours as needed for muscle spasms.    . diazepam (VALIUM) 5 MG tablet Take 1 tablet (5 mg total) by mouth at bedtime. 3 tablet 0  . donepezil (ARICEPT) 5 MG tablet Take 10 mg by mouth at bedtime.    . famotidine (PEPCID) 20 MG tablet Take 20 mg by mouth daily.   3  . Garlic 9381 MG CAPS Take 1 capsule by mouth daily.    . hydrochlorothiazide (HYDRODIURIL) 25 MG tablet Take 25 mg by mouth daily.     . meloxicam (MOBIC) 7.5 MG tablet Take 7.5 mg by mouth daily.    . Multiple Vitamin (MULTIVITAMIN WITH MINERALS) TABS tablet Take 1 tablet by mouth daily.    . Nutritional Supplements (NUTRITIONAL SUPPLEMENT PO) Take 2 capsules by mouth daily. Lipogen PS Plus    . Nutritional Supplements (NUTRITIONAL SUPPLEMENT PO) Take 1 capsule by mouth daily. Lipogen PA Plus    . Resveratrol-Quercetin (RESVERATROL PLUS PO) Take 1 capsule by mouth daily. Omega Q plus    .  sertraline (ZOLOFT) 50 MG tablet Take 50 mg by mouth at bedtime.    . sodium chloride (OCEAN) 0.65 % SOLN nasal spray Place 1 spray into both nostrils at bedtime as needed for congestion.    . solifenacin (VESICARE) 5 MG tablet Take 5 mg by mouth daily.    . traZODone (DESYREL) 50 MG tablet Take 50 mg by mouth at bedtime.    . gabapentin (NEURONTIN) 300 MG capsule Take 300 mg by mouth 2 (two) times daily. Reported on 06/04/2015    . Melatonin-Pyridoxine 3-1 MG TABS Take 1 tablet by mouth at bedtime. Reported on 06/04/2015    . Misc. Devices (ROLLATOR ULTRA-LIGHT) MISC 1 each by Does not apply route daily. (Patient not taking: Reported on 05/21/2015) 1 each 0  . OVER THE COUNTER MEDICATION Take 2 capsules by mouth daily as needed. Reported on 06/04/2015     No current  facility-administered medications for this visit.    Functional Status: In your present state of health, do you have any difficulty performing the following activities: 06/04/2015 05/11/2015  Hearing? Y N  Vision? N N  Difficulty concentrating or making decisions? [name]  Walking or climbing stairs? Y Y  Dressing or bathing? N N  Doing errands, shopping? [name]  Preparing Food and eating ? N N  Using the Toilet? N N  In the past six months, have you accidently leaked urine? Y N  Do you have problems with loss of bowel control? Y N  Managing your Medications? Y Y  Managing your Finances? [name]  Housekeeping or managing your Housekeeping? N Y    Fall/Depression Screening: PHQ 2/9 Scores 06/04/2015 05/11/2015  PHQ - 2 Score 0 0    Assessment: 1.  Medication adherence:  She is 100% adherent to her medications when she has someone to fill her pill box.  Long term she will need the [facility] pharmacy to supply her medications to allow for them to help with adherence.  It is a free option with her insurance.   2.  Cholesterol:  Her cholesterol is elevated and she will start atorvastatin.  She will need to call Dr. Glenna Durand nurse in 2 weeks to let them know how she is tolerating it.   Plan: 1.  I filled her pill box for 2 weeks.  She had enough medications, except for Gabapentin, to fill both boxes.  2.  I called [facility] to set up her account and needed an email address.  Misty Patterson will ask her daughter if she can use her email address to set up the account.   3.  I called in a refill for her Vesicare and determined she had a prescription for Gabapentin ready for pick up as well.  She will be able to pick up Vesicare, Gabapentin and Atorvastatin all on Saturday, June 05, 2015. 4.  I will follow up in 2 weeks to see how she is doing with her adherence and to fill her pill box and to set her up with [facility].   5.  I will notify Misty Naas, RN, nurse care manager of my findings.   THN CM Care  Plan Problem One        Most Recent Value   Care Plan Problem One  Medication Adherence   Role Documenting the Problem One  Clinical Pharmacist   Care Plan for Problem One  Active   THN Long Term Goal (31-90 days)  Misty Patterson will be adherent  to her medications 80% of the time evident by review of her pill box review by the pharmacist or nurse over the next 90 days   THN Long Term Goal Start Date  05/11/15   Interventions for Problem One Long Term Goal  I filled her pill box and educated her on the importance of taking her medications.  I discussed strategies on how to remember to taked her medications.  I changed her from taking medications four times a day to taking them three times a day.    THN CM Short Term Goal #1 (0-30 days)  Misty Patterson will be adherent to her medications 80% of the time over the next 30 days evident by review of her pill box by the pharmacist over the next 30 days   THN CM Short Term Goal #1 Start Date  05/11/15   [facility] CM Short Term Goal #1 Met Date  06/04/15   Interventions for Short Term Goal #1  I filled Misty Patterson's pill box for a week and changed her from having four times a day medications to three times a day medications.  I instructed her on the importance of taking her medications daily.       Deanne Coffer, PharmD, [place] [phone]

## 2015-06-07 ENCOUNTER — Ambulatory Visit: Payer: PPO | Admitting: *Deleted

## 2015-06-07 ENCOUNTER — Ambulatory Visit: Payer: Self-pay | Admitting: *Deleted

## 2015-06-07 ENCOUNTER — Other Ambulatory Visit: Payer: Self-pay | Admitting: *Deleted

## 2015-06-07 DIAGNOSIS — C44722 Squamous cell carcinoma of skin of right lower limb, including hip: Secondary | ICD-10-CM | POA: Diagnosis not present

## 2015-06-07 DIAGNOSIS — D0472 Carcinoma in situ of skin of left lower limb, including hip: Secondary | ICD-10-CM | POA: Diagnosis not present

## 2015-06-07 DIAGNOSIS — L308 Other specified dermatitis: Secondary | ICD-10-CM | POA: Diagnosis not present

## 2015-06-07 NOTE — Patient Outreach (Signed)
[place] [facility]) Care Management  06/07/2015  JILISA DHANRAJ [birth year] [id number]  Successful telephone outreach to Ms. Siarrah Bartnik to reschedule previously planned [place] home visit with LCSW to discuss patient's transportation needs.  Ms. Dimmock had cancelled [facility] CM home visit because she had a MD appointment that arose after her scheduled PCP visit on June 03, 2015.  Ms. Osu stated that her MD appointments went well with her PCP, and that he had referred her to a "vein specialist," whom she saw today.  Ms. Trivette did not specify what the outcome of the visit with the vein specialist was, and just stated, "he just said I was old."  Ms. Matsuyama reported that she has been "getting along real good," and reported that she "occasionally gets short winded," but "not too much."  Ms. Moschella did not sound short of breath or distressed during our call today.  Ms. Veasy denied the desire to have educational materials around her health issues.  I provided positive reinforcement and encouragement around Ms. Gehrig's adherence to her medication regimen while [facility] pharmacist [name] Pettus has been visiting her to re-fill her pill box.  We discussed the possibility that Ms. Debell may no longer need [place] assistance, and will discuss at our next home visit, planned for Friday, June 11, 2015.  Plan: [place] home visit re-scheduled for Friday, June 11, 2015 to assess Ms. Hemann's overall health status/  progress. Communication sent to [facility] LCSW, regarding next [place] home visit on Friday, June 11, 2015, to determine if joint visit between [place] and [facility] LCSW can be arranged.   [name], RN, BSN, [employer] [facility] Care Management  [phone]

## 2015-06-11 ENCOUNTER — Encounter: Payer: Self-pay | Admitting: *Deleted

## 2015-06-11 ENCOUNTER — Other Ambulatory Visit: Payer: Self-pay | Admitting: *Deleted

## 2015-06-11 NOTE — Patient Outreach (Signed)
Misty Patterson) Care Management   06/11/2015  Misty Patterson Jul 10, 1926 [id number]  Misty Patterson is an 82 y.o. female referred by her PCP, Dr. Wenda Low for medication adherence and management issues, as well as disease management for dementia and hypertension. Misty Patterson pharmacist, Misty Patterson, has been working with Misty Patterson for several weeks now re-filling her pill box, and has noted 100% medication adherence/ compliance since she has been doing so.  Misty Patterson remains actively involved in addressing Misty Patterson's medication adherence issues.   Referral for Misty Patterson has also been made for issues Misty Patterson has described around getting to and from provider appointments, going to the pharmacy, doing errands, etc.  Misty Patterson has 2 adult children, one of whom lives with her, whom she states "are too busy" to assist her, stating, "I don't want to burden them with driving me around."  [name] reports that during her visit with her PCP last week, she was advised to drive only short distances, although she admitted to me that she "drove to the other side of [place] this week to pick up the cream my dermatologist prescribed for me, because I didn't have any other choice."  I advised Misty Patterson to follow her doctor's orders around her driving, and reminded her that Misty Patterson had attempted to contact her by phone, and would continue to do so.    Today, Misty Patterson states that she feels good, aside from her "normal aches and pains after my fall 3 years ago."  She reports that "my pain medicine works pretty good to make the pain better, and I don't have to take it very often."  Misty Patterson denies any new falls, and continues to deny any desire to monitor her BP at home, change her diet, perform daily weights, or read any educational material related to her current state of health.  She could not remember when her next scheduled OV was with her PCP, whom she last saw on June 03, 2015, so I called Dr.  Sherilyn Patterson office and confirmed that her next scheduled PCP visit is September 29, 2015.  I wrote this appointment on her calendar for her.  We discussed today that she has done very well with her medication adherence since Misty Patterson has been visiting her, and I encouraged her to schedule a visit with Misty Patterson soon so her transportation issues could also be addressed.  Misty Patterson and I agreed that she is doing well from an overall disease management perspective, and she denied needs outside of medication adherence and transportation.  We agreed that Misty Patterson would sign off for now.  I made sure Misty Patterson had my contact information should she need me in the future.  Subjective: "I am feeling good, I just wish I could still drive."  Objective:    BP 132/62 mmHg  Pulse 56  Resp 16  SpO2 98%   Review of Systems  Constitutional: Positive for malaise/fatigue.  Eyes: Negative.   Respiratory: Negative.   Cardiovascular: Negative.  Negative for chest pain and leg swelling.  Gastrointestinal: Negative.   Genitourinary: Negative.   Musculoskeletal: Positive for myalgias and back pain.       Patient described usual chronic back, knee, and shoulder pain, "after I fell 3 years ago."  Skin: Negative.   Neurological: Negative.   Psychiatric/Behavioral: Negative.  Negative for depression. The patient is not nervous/anxious.     Physical Exam  Constitutional: She  is oriented to person, place, and time. She appears well-developed and well-nourished.  Cardiovascular: Normal rate, regular rhythm, normal heart sounds and intact distal pulses.   Respiratory: Effort normal and breath sounds normal. No respiratory distress.  GI: Soft. Bowel sounds are normal.  Musculoskeletal: She exhibits no edema.  Neurological: She is alert and oriented to person, place, and time.  Skin: Skin is warm, dry and intact.     Patient has ~2cm open sore on her lateral outer (R) foot, which  she has covered by a band-aid.  Patient states she is actively being followed by dermatologist, "who gave me medicine to put on it earlier this week."  Psychiatric: She has a normal mood and affect. Her behavior is normal. Thought content normal.    Assessment:  Misty Patterson has been 100% compliant with her medication adherence since Misty Patterson has been working with her to keep her pill boxes filled.  Transportation to provider appointments, pharmacy, and errands continues to be an issue for Misty Patterson, and Misty Patterson is involved in her care to address those needs.  Misty Patterson does not wish to monitor her BP at home, nor to receive education around her state of health.  Plan:  [place] signing off; will notify established Misty Patterson and Patterson disciplines, as well as her PCP of same.  I appreciated the opportunity to be involved in this patient's care and will be happy to assist in the future as needed.   Misty Rack, Misty Patterson, Misty Patterson, Misty Patterson Care Management  [phone]

## 2015-06-14 ENCOUNTER — Encounter: Payer: Self-pay | Admitting: *Deleted

## 2015-06-18 ENCOUNTER — Other Ambulatory Visit: Payer: Self-pay

## 2015-06-18 NOTE — Patient Outreach (Signed)
[place] [facility]) Care Management  [place]   06/18/2015  Misty Patterson 1926-06-20 [id number]  Subjective: Misty Patterson is an 82 year old female who I have been assisting with filling her pill box every two weeks to help with adherence.  She is 100% adherent to her medications when her pill box is filled.  She is unable to fill it herself and her son and daughter are not able to assist with it.  She stated multiple times she "does not want to burden them with it".  She stated [facility] did call her.  She was able to set up an email account with gmail.  She was able to give them the name of all her medications and read the instructions to them.  I called [facility] to verify all the information was correct.  I was able to go over all the medications, clarify doses and instructions, clarify her OTC medications, and inform them she is also supposed to be on atorvastatin 10 mg daily.  She did not have atorvastatin in her home.  I called [facility] and was told Dr. Glenna Durand office did not call a prescription into them.  I called and left a message with Dr. Glenna Durand nurse to see if she will call a prescription in as I was told she was going to do that 2 weeks ago.  She again is 100% adherent to all her medications.    Objective:   Encounter Medications: Outpatient Encounter Prescriptions as of 06/18/2015  Medication Sig Note  . Acetaminophen-Codeine 300-30 MG tablet Take 1 tablet by mouth 3 (three) times daily as needed for pain.   Marland Kitchen albuterol (PROVENTIL HFA;VENTOLIN HFA) 108 (90 BASE) MCG/ACT inhaler Inhale 2 puffs into the lungs every 4 (four) hours as needed for wheezing or shortness of breath.    Marland Kitchen amLODipine (NORVASC) 5 MG tablet Take 5 mg by mouth 2 (two) times daily.    Marland Kitchen atenolol (TENORMIN) 25 MG tablet Take 12.5 mg by mouth daily. 02/12/2015: Unknown time for last dose  . atorvastatin (LIPITOR) 10 MG tablet Take 10 mg by mouth daily.   . B Complex-C (B-COMPLEX  WITH VITAMIN C) tablet Take 1 tablet by mouth daily. Reported on 05/21/2015   . Ca Carb-Glucos-Chond-Phelloden (FLEXI JOINT RELIEF PLUS PO) Take 1 tablet by mouth daily.   . Cholecalciferol (VITAMIN D3) 10000 units capsule Take 10,000 Units by mouth once a week.   . clopidogrel (PLAVIX) 75 MG tablet Take 75 mg by mouth daily.   . cyclobenzaprine (FLEXERIL) 5 MG tablet Take 5 mg by mouth every 8 (eight) hours as needed for muscle spasms.   . diazepam (VALIUM) 5 MG tablet Take 1 tablet (5 mg total) by mouth at bedtime.   . donepezil (ARICEPT) 5 MG tablet Take 10 mg by mouth at bedtime.   . famotidine (PEPCID) 20 MG tablet Take 20 mg by mouth daily.  05/13/2015: Patient is taking twice a day  . gabapentin (NEURONTIN) 300 MG capsule Take 300 mg by mouth 2 (two) times daily. Reported on 06/04/2015 05/13/2015: Patient is taking three times a day  . Garlic 0086 MG CAPS Take 1 capsule by mouth daily.   . hydrochlorothiazide (HYDRODIURIL) 25 MG tablet Take 25 mg by mouth daily.  04/28/2013: .  . meloxicam (MOBIC) 7.5 MG tablet Take 7.5 mg by mouth daily.   . Multiple Vitamin (MULTIVITAMIN WITH MINERALS) TABS tablet Take 1 tablet by mouth daily.   . Nutritional  Supplements (NUTRITIONAL SUPPLEMENT PO) Take 2 capsules by mouth daily. Lipogen PS Plus   . Nutritional Supplements (NUTRITIONAL SUPPLEMENT PO) Take 1 capsule by mouth daily. Lipogen PA Plus   . Resveratrol-Quercetin (RESVERATROL PLUS PO) Take 1 capsule by mouth daily. Omega Q plus   . sertraline (ZOLOFT) 50 MG tablet Take 50 mg by mouth at bedtime.   . sodium chloride (OCEAN) 0.65 % SOLN nasal spray Place 1 spray into both nostrils at bedtime as needed for congestion.   . solifenacin (VESICARE) 5 MG tablet Take 5 mg by mouth daily. 01/25/2015: Sample from [facility]  . traZODone (DESYREL) 50 MG tablet Take 50 mg by mouth at bedtime.   . Melatonin-Pyridoxine 3-1 MG TABS Take 1 tablet by mouth at bedtime. Reported on 06/18/2015   . Misc. Devices  (ROLLATOR ULTRA-LIGHT) MISC 1 each by Does not apply route daily. (Patient not taking: Reported on 06/18/2015)   . OVER THE COUNTER MEDICATION Take 2 capsules by mouth daily as needed. Reported on 06/18/2015 05/13/2015: Patient is taking as needed   No facility-administered encounter medications on file as of 06/18/2015.    Functional Status: In your present state of health, do you have any difficulty performing the following activities: 06/04/2015 05/11/2015  Hearing? Y N  Vision? N N  Difficulty concentrating or making decisions? Misty Patterson  Walking or climbing stairs? Y Y  Dressing or bathing? N N  Doing errands, shopping? Misty Patterson  Preparing Food and eating ? N N  Using the Toilet? N N  In the past six months, have you accidently leaked urine? Y N  Do you have problems with loss of bowel control? Y N  Managing your Medications? Y Y  Managing your Finances? Misty Patterson  Housekeeping or managing your Housekeeping? N Y    Fall/Depression Screening: PHQ 2/9 Scores 06/04/2015 05/11/2015  PHQ - 2 Score 0 0    Assessment: 1.  Medication adherence:  She is 100% adherent to her medications  2. [facility]:  Misty Patterson set up the [place] by herself.  It will be delivered in 2 weeks.  I only needed to clarify a few medication issues.  3. Atorvastatin:  Atorvastatin was not called into the pharmacy so Misty Patterson did not have it to place into her pill box.   Plan: 1.  I filled Misty Patterson's pill box for 2 weeks. She had all her medications except Atorvastatin to fill the pill box.  2.  I called Dr. Glenna Durand office and requested Atorvastatin be sent to [place] so Misty Patterson would be able to have it included in the pill pack. 3.  I called [facility] and determined all items had been set up correctly for delivery in the next 2 weeks.  I gave them my name and number in case they needed to get in touch with me to clarify anything.  4.  I will follow up in 2 weeks to make sure she has received the pill  pack and refill her pill box if she has not.    THN CM Care Plan Problem One        Most Recent Value   Care Plan Problem One  Medication Adherence   Role Documenting the Problem One  Clinical Pharmacist   Care Plan for Problem One  Active   THN Long Term Goal (31-90 days)  Ms. Monty will be adherent to her medications 80% of the time evident by review of her  pill box review by the pharmacist or nurse over the next 90 days   [place] Term Goal Start Date  05/11/15   Interventions for Problem One Long Term Goal  Ms. Nannini has consistently taken her medications over the last month.  She was able to set up the [place] and will be receiving it in the next 2 weeks.  I was able to fill her pill box for another two weeks.  She is 100% adherent to her medications.   THN CM Short Term Goal #1 (0-30 days)  Ms. Albertsen will be adherent to her medications 80% of the time over the next 30 days evident by review of her pill box by the pharmacist over the next 30 days   THN CM Short Term Goal #1 Start Date  05/11/15   [facility] CM Short Term Goal #1 Met Date  06/04/15   Interventions for Short Term Goal #1  I filled Ms. Leming's pill box for a week and changed her from having four times a day medications to three times a day medications.  I instructed her on the importance of taking her medications daily.       Deanne Coffer, PharmD, [place] [phone]

## 2015-06-21 ENCOUNTER — Other Ambulatory Visit: Payer: Self-pay | Admitting: *Deleted

## 2015-06-21 NOTE — Patient Outreach (Signed)
[place] [facility]) Care Management  06/21/2015  Misty Patterson May 07, 1926 [id number]   Follow up phone call to assess for social work needs related to transportation after home visit was cancelled for 06/07/15.  Phone just rang, no message able to be left.  This Education officer, museum will call patient within 1 week.    Sheralyn Boatman [facility] Care Management [phone]

## 2015-06-22 DIAGNOSIS — M5416 Radiculopathy, lumbar region: Secondary | ICD-10-CM | POA: Diagnosis not present

## 2015-06-22 DIAGNOSIS — M546 Pain in thoracic spine: Secondary | ICD-10-CM | POA: Diagnosis not present

## 2015-06-22 DIAGNOSIS — M542 Cervicalgia: Secondary | ICD-10-CM | POA: Diagnosis not present

## 2015-06-28 ENCOUNTER — Encounter: Payer: Self-pay | Admitting: *Deleted

## 2015-06-28 ENCOUNTER — Other Ambulatory Visit: Payer: Self-pay | Admitting: *Deleted

## 2015-06-28 DIAGNOSIS — M545 Low back pain: Secondary | ICD-10-CM | POA: Diagnosis not present

## 2015-06-28 NOTE — Patient Outreach (Addendum)
  [facility]) Care Management  06/28/2015  PRAKRITI WALENTA 08-27-1926 [id number]    Phone call to patient to discuss transportation needs. Per patient, her doctor recommended that she drive with a passenger.  Her surgeon did not restrict her driving at all.  Transportation resources reviewed with patient Medical illustrator and private pay companies. Per patient due to the lack of transportation resources in the area and the high cost if private pay complaniues, she drives only when necessary.  Per patient, she has no friends that can assist with this, her son works during the day "I can't ask him to take off work, he has to work to pay his bills".  Per patient, she has a   daughter that lives in [place] who tries to help but she has to work as well.  This Education officer, museum emphasized safety and strongly recommended that she follow the doctor's recommendations regarding driving.   Plan:  This Education officer, museum will mail patient information Midwife and private pay [employer].  [facility] CM Care Plan Problem One        Most Recent Value   Care Plan Problem One  transporation   Role Documenting the Problem One  Clinical Social Worker   Care Plan for Problem One  Active   THN CM Short Term Goal #1 (0-30 days)  patient will veralize receipt of transportation resources within 30 days   THN CM Short Term Goal #1 Start Date  06/28/15   Interventions for Short Term Goal #1  request made to case management assistant to mail patient  transportation resources.  Transporation options discussed with patient.Sheralyn Boatman [facility] Care Management [phone]

## 2015-06-29 ENCOUNTER — Other Ambulatory Visit: Payer: Self-pay | Admitting: *Deleted

## 2015-06-29 DIAGNOSIS — I739 Peripheral vascular disease, unspecified: Secondary | ICD-10-CM

## 2015-06-29 DIAGNOSIS — M542 Cervicalgia: Secondary | ICD-10-CM | POA: Diagnosis not present

## 2015-06-30 NOTE — Patient Outreach (Signed)
[facility]) Care Management  06/30/2015  Misty Patterson August 11, 1926 [id number]   Request received from [facility], LCSW to mail patient transportation resources. Information mailed today, 06/30/15.

## 2015-07-01 DIAGNOSIS — M542 Cervicalgia: Secondary | ICD-10-CM | POA: Diagnosis not present

## 2015-07-02 ENCOUNTER — Other Ambulatory Visit: Payer: Self-pay

## 2015-07-05 ENCOUNTER — Ambulatory Visit: Payer: PPO | Admitting: Neurology

## 2015-07-05 NOTE — Patient Outreach (Signed)
[facility]) Care Management  [place]   07/02/15  TILLEY FAETH February 23, 1927 [id number]  Subjective: Misty Patterson is an 82 year old female who I am assisting with filling her pill box and monitoring her adherence.  She has been 100% adherence since I have been filling her pill box.  We determined that she can get her medications filled through [facility] which is a benefit through her insurance [employer].  Today I am here to review the first fill that they have done.  Unfortunately, Misty Patterson was 45 minutes for our appointment due to her grandson having to be in court.  Once we able to review her medications, I determined that atorvastatin, amlodipine, HCTZ, and Gabapentin were not filled in the pill pack. I called the pharmacy and determined that all but atorvastatin were too early to refill.  They did not have a prescription for Atorvastatin so they could not fill it.  The packs also did not include the omega 3, Lipogen PA plus, and Lipogen PS.  Her as needed medications were not included as well (diazepam, cyclobenzaprine and acetaminophen-codeine).  I went ahead and filled her pill boxes for two weeks with the missing medications and explained that she will need to take the medications in the pill pack and the medications in the pill boxes until we can get all the medications together.  She stated she understood.  I will need to work with [facility] and Ms. Furniss until this happens.    Objective:   Encounter Medications: Outpatient Encounter Prescriptions as of 07/02/2015  Medication Sig Note  . Acetaminophen-Codeine 300-30 MG tablet Take 1 tablet by mouth 3 (three) times daily as needed for pain.   Marland Kitchen albuterol (PROVENTIL HFA;VENTOLIN HFA) 108 (90 BASE) MCG/ACT inhaler Inhale 2 puffs into the lungs every 4 (four) hours as needed for wheezing or shortness of breath.    Marland Kitchen atenolol (TENORMIN) 25 MG tablet Take 12.5 mg by mouth daily. 02/12/2015: Unknown time for last  dose  . B Complex-C (B-COMPLEX WITH VITAMIN C) tablet Take 1 tablet by mouth daily. Reported on 05/21/2015   . Ca Carb-Glucos-Chond-Phelloden (FLEXI JOINT RELIEF PLUS PO) Take 1 tablet by mouth daily.   . Cholecalciferol (VITAMIN D3) 10000 units capsule Take 10,000 Units by mouth once a week.   . clopidogrel (PLAVIX) 75 MG tablet Take 75 mg by mouth daily.   . cyclobenzaprine (FLEXERIL) 5 MG tablet Take 5 mg by mouth every 8 (eight) hours as needed for muscle spasms.   . diazepam (VALIUM) 5 MG tablet Take 1 tablet (5 mg total) by mouth at bedtime.   . donepezil (ARICEPT) 5 MG tablet Take 10 mg by mouth at bedtime.   . famotidine (PEPCID) 20 MG tablet Take 20 mg by mouth daily.  05/13/2015: Patient is taking twice a day  . gabapentin (NEURONTIN) 300 MG capsule Take 300 mg by mouth 2 (two) times daily. Reported on 06/04/2015 05/13/2015: Patient is taking three times a day  . Garlic 2010 MG CAPS Take 1 capsule by mouth daily.   . hydrochlorothiazide (HYDRODIURIL) 25 MG tablet Take 25 mg by mouth daily.  04/28/2013: .  . meloxicam (MOBIC) 7.5 MG tablet Take 7.5 mg by mouth daily.   . Multiple Vitamin (MULTIVITAMIN WITH MINERALS) TABS tablet Take 1 tablet by mouth daily.   . Nutritional Supplements (NUTRITIONAL SUPPLEMENT PO) Take 1 capsule by mouth daily. Lipogen PA Plus   . Resveratrol-Quercetin (RESVERATROL PLUS PO) Take  1 capsule by mouth daily. Omega Q plus   . sertraline (ZOLOFT) 50 MG tablet Take 50 mg by mouth at bedtime.   . sodium chloride (OCEAN) 0.65 % SOLN nasal spray Place 1 spray into both nostrils at bedtime as needed for congestion.   . solifenacin (VESICARE) 5 MG tablet Take 5 mg by mouth daily. 01/25/2015: Sample from [facility]  . traZODone (DESYREL) 50 MG tablet Take 50 mg by mouth at bedtime.   Marland Kitchen amLODipine (NORVASC) 5 MG tablet Take 5 mg by mouth 2 (two) times daily.    Marland Kitchen atorvastatin (LIPITOR) 10 MG tablet Take 10 mg by mouth daily. Reported on 07/02/2015   . Nutritional  Supplements (NUTRITIONAL SUPPLEMENT PO) Take 2 capsules by mouth daily. Reported on 07/02/2015   . [DISCONTINUED] Melatonin-Pyridoxine 3-1 MG TABS Take 1 tablet by mouth at bedtime. Reported on 06/18/2015   . [DISCONTINUED] Misc. Devices (ROLLATOR ULTRA-LIGHT) MISC 1 each by Does not apply route daily. (Patient not taking: Reported on 06/18/2015)   . [DISCONTINUED] OVER THE COUNTER MEDICATION Take 2 capsules by mouth daily as needed. Reported on 06/18/2015 05/13/2015: Patient is taking as needed   No facility-administered encounter medications on file as of 07/02/2015.    Functional Status: In your present state of health, do you have any difficulty performing the following activities: 06/04/2015 05/11/2015  Hearing? Y N  Vision? N N  Difficulty concentrating or making decisions? [name]  Walking or climbing stairs? Y Y  Dressing or bathing? N N  Doing errands, shopping? [name]  Preparing Food and eating ? N N  Using the Toilet? N N  In the past six months, have you accidently leaked urine? Y N  Do you have problems with loss of bowel control? Y N  Managing your Medications? Y Y  Managing your Finances? [name]  Housekeeping or managing your Housekeeping? N Y    Fall/Depression Screening: PHQ 2/9 Scores 06/04/2015 05/11/2015  PHQ - 2 Score 0 0    Assessment: 1.  Medication adherence:  Ms. Goddard is 100% adherent to her medications when they are placed in the pill box for her.  She has transition some of her medications to [place].  It will be interesting to see if she maintains her adherence to his new system.   2.  Pill Pack:  Not all her medications were able to be filled in the new pill pack system.  It may take a few months to sync the medications.   Plan: 1.  I filled Ms. Deboy's missing medications in her pill box.  I explained to her how she needed to take both the medications in the pill pack and the medications in the pill box.  She stated she understood.   2.  I called Dr. Glenna Durand  office and asked for a prescription for atrovastatin to be called into the [facility].  I received a call back from [place] and stated she would do that and that Ms. Dials would need a follow up lab test done in 6 weeks once she started it.  3.  I will follow up with [facility] to determine if they received the atorvastatin prescription and when they will send it to Ms. Emert.  I will also determine how they will sync her medications and if they will place any more over the counter medications in her packages.  4.  I will follow up in two weeks to determine her adherence with  both the pill packs and the pill box.    THN CM Care Plan Problem One        Most Recent Value   Care Plan Problem One  Medication Adherence   Role Documenting the Problem One  Clinical Pharmacist   Care Plan for Problem One  Active   THN Long Term Goal (31-90 days)  Ms. Giebel will be adherent to her medications 80% of the time evident by review of her pill box review by the pharmacist or nurse over the next 90 days   [place] Term Goal Start Date  05/11/15   Interventions for Problem One Long Term Goal  Ms. Victorino has consistently taken her medications over the last month.  She was able to set up the [place] and will be receiving it in the next 2 weeks.  I was able to fill her pill box for another two weeks.  She is 100% adherent to her medications.   THN CM Short Term Goal #1 (0-30 days)  Ms. Wenk will be adherent to her medications 80% of the time over the next 30 days evident by review of her pill box by the pharmacist over the next 30 days   THN CM Short Term Goal #1 Start Date  05/11/15   [facility] CM Short Term Goal #1 Met Date  06/04/15   Interventions for Short Term Goal #1  I filled Ms. Mullin's pill box for a week and changed her from having four times a day medications to three times a day medications.  I instructed her on the importance of taking her medications daily.     THN CM Short Term  Goal #2 (0-30 days)  Ms. Shadduck will be adherent to her medications at least 90% of the time by evident of review of her pill box and pill pack   THN CM Short Term Goal #2 Start Date  07/05/15   Interventions for Short Term Goal #2  I reviewed the pill pack process with Ms. Kulesza and I filled up her pill box with the missing medications.  I instructed Ms. Todisco to take both the medications out of both systems.  Ms. Rebuck stated she understood.       Deanne Coffer, PharmD, [place] [phone]

## 2015-07-06 DIAGNOSIS — M545 Low back pain: Secondary | ICD-10-CM | POA: Diagnosis not present

## 2015-07-06 DIAGNOSIS — M542 Cervicalgia: Secondary | ICD-10-CM | POA: Diagnosis not present

## 2015-07-09 ENCOUNTER — Other Ambulatory Visit: Payer: Self-pay | Admitting: *Deleted

## 2015-07-09 ENCOUNTER — Encounter: Payer: Self-pay | Admitting: *Deleted

## 2015-07-09 NOTE — Patient Outreach (Signed)
[facility]) Care Management  07/09/2015  Misty Patterson [birth year] [id number]   Phone call to patient to confirm that she received the transportation resources mailed to her.  Patient states that she received the resources and is appreciative.   Plan: Case to be closed to social work at this time.   Sheralyn Boatman [facility] Care Management [phone]

## 2015-07-16 ENCOUNTER — Ambulatory Visit: Payer: PPO | Admitting: Neurology

## 2015-07-16 ENCOUNTER — Other Ambulatory Visit: Payer: Self-pay

## 2015-07-16 NOTE — Patient Outreach (Signed)
[facility]) Care Management  [place]   07/16/2015  Misty Patterson 04/05/1926 [id number]  Subjective: Misty Patterson is a 82 year old female who I have been following for medication adherence. Her great granddaughter, Misty Patterson, is now living with her and her son, Misty Patterson.   I have been able to switch her chronic medications to Misty pack.  The only medications that were not filled in the last shipment were HCTZ, amlodipine, and gabapentin due to being filled too soon.  I have been placing these medications into a separate Misty box which Misty Patterson has been taking in conjunction with the Misty pack.  She is remaining 100% compliant.  Her OTC medications have also been placed in the Misty pack with the exception of Omega 3 and Lipogen PS plus. I also placed those in the Misty box.  She has had a prescription for atorvastatin 10 mg also called into Misty Patterson.  I called Misty Patterson and determined that Omega 3, HCTZ, Amlodipine,and Atorvastatin will all be included in the next shipment that will be shipped 07/19/15.  Gabapentin will be included in the 08/18/15 shipment.  I will be able to continue to place Gabapentin in her Misty box until it is included in her Misty pack.  Lipogen PS will not be able to be included since it is not sold in stores and has to be ordered.  Misty Patterson understands she will have to order it on her own.  I found a discrepancy with Meloxicam.  Misty Patterson sent Meloxicam 15 mg daily and the EMR at the primary care provider had Meloxicam 7.5 mg daily.  [facility] had called Misty Patterson office for the refill.  I stated that Misty Patterson prescribed the medication originally and they are going to call him and clarify the prescription.  She saw Misty Patterson for her shoulder and received prescriptions for Gabapentin 600 mg at bedtime and Methocarbamol 500 mg twice a day as needed.  She has not taken the Gabapentin but started the methocarbamol today.   She also stated she needed her Diazepam which she stated the provider denied.  I called Misty Patterson and determined the refill request was sent to Misty Patterson, the surgeon, who did not prescribe it.  I asked for them to send it to Misty Patterson.  They stated they would.  She is going to follow up with Misty Patterson to see if Misty Patterson refilled the Diazepam.     Objective:   Encounter Medications: Outpatient Encounter Prescriptions as of 07/16/2015  Medication Sig Note  . albuterol (PROVENTIL HFA;VENTOLIN HFA) 108 (90 BASE) MCG/ACT inhaler Inhale 2 puffs into the lungs every 4 (four) hours as needed for wheezing or shortness of breath.    Marland Kitchen amLODipine (NORVASC) 5 MG tablet Take 5 mg by mouth 2 (two) times daily.    Marland Kitchen atenolol (TENORMIN) 25 MG tablet Take 12.5 mg by mouth daily. 02/12/2015: Unknown time for last dose  . B Complex-C (B-COMPLEX WITH VITAMIN C) tablet Take 1 tablet by mouth daily. Reported on 05/21/2015   . Cholecalciferol (VITAMIN D3) 10000 units capsule Take 10,000 Units by mouth once a week.   . clopidogrel (PLAVIX) 75 MG tablet Take 75 mg by mouth daily.   Marland Kitchen donepezil (ARICEPT) 5 MG tablet Take 10 mg by mouth at bedtime.   . famotidine (PEPCID) 20 MG tablet Take 20 mg by mouth 2 (two) times daily.  07/16/2015: Misty pack is filling  .  gabapentin (NEURONTIN) 300 MG capsule Take 300 mg by mouth 3 (three) times daily. Reported on 06/04/2015 07/16/2015: Place in Misty box  . Garlic [id number] MG CAPS Take 1 capsule by mouth daily.   . meloxicam (MOBIC) 7.5 MG tablet Take 7.5 mg by mouth daily. 07/16/2015: Has 15 mg tabs in pillpack  . methocarbamol (ROBAXIN) 500 MG tablet Take 500 mg by mouth 2 (two) times daily as needed for muscle spasms.   . Misc Natural Products (GLUCOSAMINE CHOND COMPLEX/MSM PO) Take 1 tablet by mouth daily.   . Multiple Vitamin (MULTIVITAMIN WITH MINERALS) TABS tablet Take 1 tablet by mouth daily.   . Nutritional Supplements (NUTRITIONAL SUPPLEMENT PO) Take 1 capsule by mouth 2  (two) times daily. Lipogen PS Plus   . Resveratrol-Quercetin (RESVERATROL PLUS PO) Take 1 capsule by mouth daily. Omega Q plus   . sertraline (ZOLOFT) 50 MG tablet Take 50 mg by mouth at bedtime.   . solifenacin (VESICARE) 5 MG tablet Take 5 mg by mouth daily. 07/16/2015: pillpack is filling  . traZODone (DESYREL) 50 MG tablet Take 50 mg by mouth at bedtime.   . Acetaminophen-Codeine 300-30 MG tablet Take 1 tablet by mouth 3 (three) times daily as needed for pain. Reported on 07/16/2015   . atorvastatin (LIPITOR) 10 MG tablet Take 10 mg by mouth daily. Reported on 07/16/2015 07/16/2015: Patterson has not delivered it to her yet.  She will get it with her next shipment at the end of May 2017  . cyclobenzaprine (FLEXERIL) 5 MG tablet Take 5 mg by mouth every 8 (eight) hours as needed for muscle spasms. Reported on 07/16/2015   . diazepam (VALIUM) 5 MG tablet Take 1 tablet (5 mg total) by mouth at bedtime. (Patient not taking: Reported on 07/16/2015)   . hydrochlorothiazide (HYDRODIURIL) 25 MG tablet Take 25 mg by mouth daily. Reported on 07/16/2015 04/28/2013: .  . sodium chloride (OCEAN) 0.65 % SOLN nasal spray Place 1 spray into both nostrils at bedtime as needed for congestion. Reported on 07/16/2015   . [DISCONTINUED] Ca Carb-Glucos-Chond-Phelloden (FLEXI JOINT RELIEF PLUS PO) Take 1 tablet by mouth daily.   . [DISCONTINUED] Nutritional Supplements (NUTRITIONAL SUPPLEMENT PO) Take 1 capsule by mouth daily. Lipogen PA Plus    No facility-administered encounter medications on file as of 07/16/2015.    Functional Status: In your present state of health, do you have any difficulty performing the following activities: 06/04/2015 05/11/2015  Hearing? Y N  Vision? N N  Difficulty concentrating or making decisions? [name]  Walking or climbing stairs? Y Y  Dressing or bathing? N N  Doing errands, shopping? [name]  Preparing Food and eating ? N N  Using the Toilet? N N  In the past six months, have you accidently leaked  urine? Y N  Do you have problems with loss of bowel control? Y N  Managing your Medications? Y Y  Managing your Finances? [name]  Housekeeping or managing your Housekeeping? N Y    Fall/Depression Screening: PHQ 2/9 Scores 06/04/2015 05/11/2015  PHQ - 2 Score 0 0    Assessment: 1.  Methocarbamol: May increase anticholinergic adverse effects, sedation, and weakness.  It is on the [place] List of Inappropriate Medications in the Elderly.  2.  Gabapentin:  She is already taking Gabapentin 300 mg at bedtime.  The addition of Gabapentin 600 mg at bedtime was duplication of therapy.   Plan: 1.  I filled Misty Patterson's Misty box up with the medications she  was missing from the Misty pack.  I confirmed that the next Misty back will contain Omega 3, HCTZ, amlodipine and atorvastatin.  2.  I explained to Misty Patterson that she was already taking Gabapentin at bedtime and that the new medication was a duplication of what she was already taking.  I told her I will send a letter to Misty Patterson letting him know of the duplication.  3.  I explained that methocarbamol may cause side effects such as dizziness, decrease ability to go to the bathroom, confusion, sedation and weakness.  I told her to call her doctor if any of these symptoms occur.  She stated she understood.   4.  I will follow up in 2 weeks to see how she adhering to her medications and to see if her new shipment has been received.   Deanne Coffer, PharmD, [place] [phone]

## 2015-07-20 DIAGNOSIS — L820 Inflamed seborrheic keratosis: Secondary | ICD-10-CM | POA: Diagnosis not present

## 2015-07-20 DIAGNOSIS — Z85828 Personal history of other malignant neoplasm of skin: Secondary | ICD-10-CM | POA: Diagnosis not present

## 2015-07-20 DIAGNOSIS — D485 Neoplasm of uncertain behavior of skin: Secondary | ICD-10-CM | POA: Diagnosis not present

## 2015-07-23 ENCOUNTER — Encounter: Payer: Self-pay | Admitting: *Deleted

## 2015-07-23 NOTE — Progress Notes (Signed)
This encounter was created in error - please disregard.

## 2015-07-29 ENCOUNTER — Other Ambulatory Visit: Payer: Self-pay

## 2015-07-29 NOTE — Patient Outreach (Signed)
[facility]) Care Management  [place]   07/29/2015  Misty Patterson [birth year] [id number]  Subjective: Misty Patterson is an 82 year old female who I am assisting with filling her pill box and monitoring her adherence.  She has been 100% adherence since I have been filling her pill box, until today.  She is filling her medications with Pill Pack which is a benefit through her insurance [employer].  Today I am here to review how she has been doing with the first set of pill packs plus the pill box.   She has taken all of her pill pack medications but she has missed a four days of her pill box.  Her pill box contains her amlodipine, gabapentin, omega 3, and Lipogen PS.  She has received her new shipment which does has amlodipine and Omega 3 in it.  Her gabapentin will not be in the Pill Pack until June due to refill due date.  She has requested her Vesicare be placed in the night time dosing.   Objective:   Encounter Medications: Outpatient Encounter Prescriptions as of 07/29/2015  Medication Sig Note  . albuterol (PROVENTIL HFA;VENTOLIN HFA) 108 (90 BASE) MCG/ACT inhaler Inhale 2 puffs into the lungs every 4 (four) hours as needed for wheezing or shortness of breath.    Marland Kitchen amLODipine (NORVASC) 5 MG tablet Take 5 mg by mouth 2 (two) times daily.    Marland Kitchen atenolol (TENORMIN) 25 MG tablet Take 12.5 mg by mouth daily.   Marland Kitchen atorvastatin (LIPITOR) 10 MG tablet Take 10 mg by mouth daily. Reported on 07/16/2015   . B Complex-C (B-COMPLEX WITH VITAMIN C) tablet Take 1 tablet by mouth daily. Reported on 05/21/2015   . cholecalciferol (VITAMIN D) 1000 units tablet Take 1,000 Units by mouth daily.   . clopidogrel (PLAVIX) 75 MG tablet Take 75 mg by mouth daily.   . diazepam (VALIUM) 5 MG tablet Take 1 tablet (5 mg total) by mouth at bedtime.   . donepezil (ARICEPT) 5 MG tablet Take 10 mg by mouth at bedtime.   . famotidine (PEPCID) 20 MG tablet Take 20 mg by mouth 2 (two) times daily.   07/16/2015: Pill pack is filling  . gabapentin (NEURONTIN) 300 MG capsule Take 300 mg by mouth 3 (three) times daily. Reported on 06/04/2015 07/16/2015: Place in pill box  . Garlic 2725 MG CAPS Take 1 capsule by mouth daily.   . hydrochlorothiazide (HYDRODIURIL) 25 MG tablet Take 25 mg by mouth daily. Reported on 07/16/2015 04/28/2013: .  . meloxicam (MOBIC) 15 MG tablet Take 15 mg by mouth daily.   . methocarbamol (ROBAXIN) 500 MG tablet Take 500 mg by mouth 2 (two) times daily as needed for muscle spasms.   . Misc Natural Products (GLUCOSAMINE CHOND COMPLEX/MSM PO) Take 1 tablet by mouth daily.   . Multiple Vitamin (MULTIVITAMIN WITH MINERALS) TABS tablet Take 1 tablet by mouth daily.   . Nutritional Supplements (NUTRITIONAL SUPPLEMENT PO) Take 1 capsule by mouth 2 (two) times daily. Lipogen PS Plus   . Omega 3 1000 MG CAPS Take 1,000 mg by mouth daily.   . sertraline (ZOLOFT) 50 MG tablet Take 50 mg by mouth at bedtime.   . sodium chloride (OCEAN) 0.65 % SOLN nasal spray Place 1 spray into both nostrils at bedtime as needed for congestion. Reported on 07/16/2015   . solifenacin (VESICARE) 5 MG tablet Take 5 mg by mouth daily.   . traZODone (DESYREL) 50 MG tablet  Take 50 mg by mouth at bedtime.   . [DISCONTINUED] Cholecalciferol (VITAMIN D3) 10000 units capsule Take 10,000 Units by mouth once a week.   . Acetaminophen-Codeine 300-30 MG tablet Take 1 tablet by mouth 3 (three) times daily as needed for pain. Reported on 07/29/2015   . cyclobenzaprine (FLEXERIL) 5 MG tablet Take 5 mg by mouth every 8 (eight) hours as needed for muscle spasms. Reported on 07/29/2015   . [DISCONTINUED] meloxicam (MOBIC) 7.5 MG tablet Take 7.5 mg by mouth daily. 07/16/2015: Has 15 mg tabs in pillpack  . [DISCONTINUED] Resveratrol-Quercetin (RESVERATROL PLUS PO) Take 1 capsule by mouth daily. Omega Q plus    No facility-administered encounter medications on file as of 07/29/2015.    Functional Status: In your present state of  health, do you have any difficulty performing the following activities: 06/04/2015 05/11/2015  Hearing? Y N  Vision? N N  Difficulty concentrating or making decisions? Malvin Johns  Walking or climbing stairs? Y Y  Dressing or bathing? N N  Doing errands, shopping? Malvin Johns  Preparing Food and eating ? N N  Using the Toilet? N N  In the past six months, have you accidently leaked urine? Y N  Do you have problems with loss of bowel control? Y N  Managing your Medications? Y Y  Managing your Finances? Malvin Johns  Housekeeping or managing your Housekeeping? N Y    Fall/Depression Screening: PHQ 2/9 Scores 06/04/2015 05/11/2015  PHQ - 2 Score 0 0    Assessment: 1.  Medication adherence:  Misty Patterson is 100% adherent to her medications in the pill pack.  She is forgetting to take the medications in the pill box. Now that her medications with the exception of gabapentin and Lipogen PS Plus will be in the pill pack, I am hopeful she will be more adherent.   2.  Pill Pack:  The new system is working.  All of her medications will be in the pill pack beginning in June.  Gabapentin is the only prescribed medication missing from the pill pack.     Plan: 1.I filled Misty Patterson's pill box with Gabapentin and LIpogen PS Plus for one month.  I explained to her how she needed to take both the medications in the pill pack and the medications in the pill box.  She stated she understood.    2.  I [facility] and asked to place Vesicare in the night time dosing.  The pharmacy technician stated it would not be a problem.  It will go into effect in June.  3.  I will follow up in 4 weeks to determine her adherence with both the pill packs and the pill box.     THN CM Care Plan Problem One        Most Recent Value   Care Plan Problem One  Medication Adherence   Role Documenting the Problem One  Clinical Pharmacist   Care Plan for Problem One  Active   THN Long Term Goal (31-90 days)  Misty Patterson will be adherent to  her medications 80% of the time evident by review of her pill box review by the pharmacist or nurse over the next 90 days   THN Long Term Goal Start Date  05/11/15   Interventions for Problem One Long Term Goal  Misty Patterson has consistently taken her medications over the last month.  She was able to set up the [facility] pharmacy and will be receiving it  in the next 2 weeks.  I was able to fill her pill box for another two weeks.  She is 100% adherent to her medications.   THN CM Short Term Goal #1 (0-30 days)  Misty Patterson will be adherent to her medications 80% of the time over the next 30 days evident by review of her pill box by the pharmacist over the next 30 days   THN CM Short Term Goal #1 Start Date  05/11/15   [facility] CM Short Term Goal #1 Met Date  06/04/15   Interventions for Short Term Goal #1  I filled Misty Patterson's pill box for a week and changed her from having four times a day medications to three times a day medications.  I instructed her on the importance of taking her medications daily.     THN CM Short Term Goal #2 (0-30 days)  Misty Patterson will be adherent to her medications in both the pill pack and the pill box 91% of the time for the next 30 days evident by review by the pharmacist.    [facility] CM Short Term Goal #2 Start Date  07/29/15   [facility] CM Short Term Goal #2 Met Date  07/29/15   Interventions for Short Term Goal #2  I counseled Misty Patterson on the fact that all but two of her medications were in the pillpack.  She will need to take both the pill pack and the pill box.  I made the pill pack start date for 07/30/15.  Misty Patterson was able to state she understood.        Deanne Coffer, PharmD, [place] [phone]

## 2015-07-30 ENCOUNTER — Ambulatory Visit: Payer: PPO | Admitting: Neurology

## 2015-08-02 ENCOUNTER — Ambulatory Visit: Payer: PPO | Admitting: Neurology

## 2015-08-26 ENCOUNTER — Encounter: Payer: Self-pay | Admitting: Vascular Surgery

## 2015-08-27 ENCOUNTER — Other Ambulatory Visit: Payer: Self-pay

## 2015-08-27 NOTE — Patient Outreach (Signed)
[facility]) Care Management  [place]   08/27/2015  MAMIE HUNDERTMARK 02-08-1927 [id number]  Subjective: Misty Patterson is an 82 year old female who I am assisting with filling her pill box and monitoring her adherence.  She is filling her medications with Pill Pack which is a benefit through her insurance [employer]. Today I am here to review how she has been doing with the pill packs plus the pill box.  She has been 100% adherence.  She has taken all of her pill pack medications and the medications in her pill box which contains her gabapentin and Lipogen PS. She has received her new shipment which supposed to contain all of her medications except Lipogen PS.  Unfortunately, the gabapentin was not included in the pill pack, it was sent in a bottle.    Objective:   Encounter Medications: Outpatient Encounter Prescriptions as of 08/27/2015  Medication Sig Note  . Acetaminophen-Codeine 300-30 MG tablet Take 1 tablet by mouth 3 (three) times daily as needed for pain. Reported on 07/29/2015   . albuterol (PROVENTIL HFA;VENTOLIN HFA) 108 (90 BASE) MCG/ACT inhaler Inhale 2 puffs into the lungs every 4 (four) hours as needed for wheezing or shortness of breath.    Marland Kitchen amLODipine (NORVASC) 5 MG tablet Take 5 mg by mouth 2 (two) times daily.    Marland Kitchen atenolol (TENORMIN) 25 MG tablet Take 12.5 mg by mouth daily.   Marland Kitchen atorvastatin (LIPITOR) 10 MG tablet Take 10 mg by mouth daily. Reported on 07/16/2015   . B Complex-C (B-COMPLEX WITH VITAMIN C) tablet Take 1 tablet by mouth daily. Reported on 05/21/2015   . cholecalciferol (VITAMIN D) 1000 units tablet Take 1,000 Units by mouth daily.   . clopidogrel (PLAVIX) 75 MG tablet Take 75 mg by mouth daily.   . cyclobenzaprine (FLEXERIL) 5 MG tablet Take 5 mg by mouth every 8 (eight) hours as needed for muscle spasms. Reported on 07/29/2015   . diazepam (VALIUM) 5 MG tablet Take 1 tablet (5 mg total) by mouth at bedtime.   . donepezil  (ARICEPT) 5 MG tablet Take 10 mg by mouth at bedtime.   . famotidine (PEPCID) 20 MG tablet Take 20 mg by mouth 2 (two) times daily.  07/16/2015: Pill pack is filling  . gabapentin (NEURONTIN) 300 MG capsule Take 300 mg by mouth 3 (three) times daily. Reported on 06/04/2015 07/16/2015: Place in pill box  . Garlic 5809 MG CAPS Take 1 capsule by mouth daily.   . hydrochlorothiazide (HYDRODIURIL) 25 MG tablet Take 25 mg by mouth daily. Reported on 07/16/2015 04/28/2013: .  . meloxicam (MOBIC) 15 MG tablet Take 15 mg by mouth daily.   . methocarbamol (ROBAXIN) 500 MG tablet Take 500 mg by mouth 2 (two) times daily as needed for muscle spasms.   . Misc Natural Products (GLUCOSAMINE CHOND COMPLEX/MSM PO) Take 1 tablet by mouth daily.   . Multiple Vitamin (MULTIVITAMIN WITH MINERALS) TABS tablet Take 1 tablet by mouth daily.   . Nutritional Supplements (NUTRITIONAL SUPPLEMENT PO) Take 1 capsule by mouth 2 (two) times daily. Lipogen PS Plus   . Omega 3 1000 MG CAPS Take 1,000 mg by mouth daily.   . sertraline (ZOLOFT) 50 MG tablet Take 50 mg by mouth at bedtime.   . sodium chloride (OCEAN) 0.65 % SOLN nasal spray Place 1 spray into both nostrils at bedtime as needed for congestion. Reported on 07/16/2015   . solifenacin (VESICARE) 5 MG tablet Take 5  mg by mouth daily.   . traZODone (DESYREL) 50 MG tablet Take 50 mg by mouth at bedtime.    No facility-administered encounter medications on file as of 08/27/2015.    Functional Status: In your present state of health, do you have any difficulty performing the following activities: 06/04/2015 05/11/2015  Hearing? Y N  Vision? N N  Difficulty concentrating or making decisions? [name]  Walking or climbing stairs? Y Y  Dressing or bathing? N N  Doing errands, shopping? [name]  Preparing Food and eating ? N N  Using the Toilet? N N  In the past six months, have you accidently leaked urine? Y N  Do you have problems with loss of bowel control? Y N  Managing your  Medications? Y Y  Managing your Finances? [name]  Housekeeping or managing your Housekeeping? N Y    Fall/Depression Screening: PHQ 2/9 Scores 06/04/2015 05/11/2015  PHQ - 2 Score 0 0    Assessment: 1. Medication adherence: Ms. Schadler is 100% adherent to her medications in the pill pack and in the pill box.  2. Pill Pack: The new system is working. All of her medications will be in the pill pack beginning in July. Gabapentin is the only prescribed medication missing from the pill pack.   Plan: 1.I filled Ms. Salva's pill box with Gabapentin and LIpogen PS Plus for one month. I explained to her how she needed to take both the medications in the pill pack and the medications in the pill box. She stated she understood.  2. I called [facility] and asked to place Gabapentin in the pill pack. The pharmacy technician stated it would not be a problem. It will go into effect in July.   3. I will follow up in 4 weeks to determine her adherence with both the pill packs and the pill box.   THN CM Care Plan Problem One        Most Recent Value   Care Plan Problem One  Medication Adherence   Role Documenting the Problem One  Clinical Pharmacist   Care Plan for Problem One  Active   THN Long Term Goal (31-90 days)  Ms. Kuehnle will be adherent to her medications 80% of the time evident by review of her pill box review by the pharmacist or nurse over the next 90 days   THN Long Term Goal Start Date  05/11/15   [facility] Long Term Goal Met Date  08/27/15   Interventions for Problem One Long Term Goal  Ms. Fairclough has consistently taken her medications over the last month.  She was able to set up the [facility] and will be receiving it in the next 2 weeks.  I was able to fill her pill box for another two weeks.  She is 100% adherent to her medications.   THN CM Short Term Goal #1 (0-30 days)  Ms. Gotto will be adherent to her medications 80% of the time over the next 30 days evident by  review of her pill box by the pharmacist over the next 30 days   THN CM Short Term Goal #1 Start Date  05/11/15   [facility] CM Short Term Goal #1 Met Date  06/04/15   Interventions for Short Term Goal #1  I filled Ms. Fronczak's pill box for a week and changed her from having four times a day medications to three times a day medications.  I instructed her on the importance of  taking her medications daily.     THN CM Short Term Goal #2 (0-30 days)  Ms. Kallie Edward will be adherent to her medications in both the pill pack and the pill box 91% of the time for the next 30 days evident by review by the pharmacist.    [facility] CM Short Term Goal #2 Start Date  07/29/15   [facility] CM Short Term Goal #2 Met Date  08/27/15   Interventions for Short Term Goal #2  I counseled Ms. Friese on the fact that all but two of her medications were in the pillpack.  She will need to take both the pill pack and the pill box.  I made the pill pack start date for 07/30/15.  Ms. Mayfield was able to state she understood.      Deanne Coffer, PharmD, [place] [phone]

## 2015-08-30 DIAGNOSIS — L820 Inflamed seborrheic keratosis: Secondary | ICD-10-CM | POA: Diagnosis not present

## 2015-08-30 DIAGNOSIS — Z85828 Personal history of other malignant neoplasm of skin: Secondary | ICD-10-CM | POA: Diagnosis not present

## 2015-08-30 DIAGNOSIS — L821 Other seborrheic keratosis: Secondary | ICD-10-CM | POA: Diagnosis not present

## 2015-09-02 ENCOUNTER — Encounter: Payer: Self-pay | Admitting: Vascular Surgery

## 2015-09-02 ENCOUNTER — Ambulatory Visit (INDEPENDENT_AMBULATORY_CARE_PROVIDER_SITE_OTHER)
Admission: RE | Admit: 2015-09-02 | Discharge: 2015-09-02 | Disposition: A | Discharge status: Home / Self Care | Payer: PPO | Source: Ambulatory Visit | Attending: Vascular Surgery | Admitting: Vascular Surgery

## 2015-09-02 ENCOUNTER — Ambulatory Visit (INDEPENDENT_AMBULATORY_CARE_PROVIDER_SITE_OTHER): Payer: PPO | Admitting: Vascular Surgery

## 2015-09-02 ENCOUNTER — Ambulatory Visit (HOSPITAL_COMMUNITY)
Admission: RE | Admit: 2015-09-02 | Discharge: 2015-09-02 | Disposition: A | Discharge status: Home / Self Care | Payer: PPO | Source: Ambulatory Visit | Attending: Vascular Surgery | Admitting: Vascular Surgery

## 2015-09-02 VITALS — BP 179/75 | HR 59 | Temp 97.0°F | Resp 18 | Ht 65.5 in | Wt 142.0 lb

## 2015-09-02 DIAGNOSIS — I739 Peripheral vascular disease, unspecified: Secondary | ICD-10-CM

## 2015-09-02 DIAGNOSIS — Z95820 Peripheral vascular angioplasty status with implants and grafts: Secondary | ICD-10-CM | POA: Diagnosis not present

## 2015-09-02 NOTE — Progress Notes (Signed)
[facility] [place] HISTORY AND PHYSICAL    History of Present Illness:  Patient is a 82 y.o. year old female who presents for evaluation of right leg pain. She currently experiences tightness and pain in her right hip and thigh after walking one block. She started developing a symptoms after breaking her toe approximately 3 years ago. She previously underwent right above-knee popliteal artery stenting by me approximately 2 years ago. She states that the claudication symptoms she had in her calf got better after that. However, she states she still has some occasional numbness and tingling in her right foot. She has had bilateral saphenous vein stripping. She denies rest pain. She denies nonhealing wounds.  she also has complaints of numbness and tingling in her hands and has had previous multiply level cervical fusion. Other medical problems include past history of ovarian cancer and hypertension both of which are controlled. Otherwise overall she is fairly healthy and performed most of her tasks of daily living independently. She does not smoke. She is on Plavix. Chronic medical problems include peripheral neuropathy, hyperlipidemia, osteoporosis, hypertension, mild dementia. These are all currently stable.  Review of systems: She denies shortness of breath. She denies chest pain.    Past Medical History  Diagnosis Date  . Cancer ([place])     ovarian  . Restless leg syndrome   . DVT (deep venous thrombosis) ([facility])     right leg  . Peripheral neuropathy ([place])   . Dyslipidemia     but doesn't take any meds   . Ovarian cancer ([place])   . Osteoporosis     takes Vit D daily  . Incontinence of urine   . Esophageal dysmotility   . Zenker's diverticulum     small  . Decreased GFR   . Chronic kidney disease   . DJD (degenerative joint disease) of cervical spine   . Radiculopathy   . CTS (carpal tunnel syndrome)   . Anxiety     takes Valium daily  . Hypertension     takes  Amlodipine and Atenolol daily  . Insomnia     takes Trazodone nightly  . Depression     takes Zoloft daily  . COPD (chronic obstructive pulmonary disease) ([place])     ProAir daily as needed  . GERD (gastroesophageal reflux disease)     takes pepcid daily  . Pneumonia     hx of-15+yrs ago  . Chronic back pain   . Cervical disc disease   . Neuropathy ([place])   . Urinary frequency   . Nocturia   . Peripheral vascular disease ([place])   . Dementia     takes aricept      Past Surgical History  Procedure Laterality Date  . Vein surgery  1950  . Abdominal aortagram  Feb. 20, 2015  . Abdominal aortagram N/A 05/02/2013    Procedure: ABDOMINAL Maxcine Ham;  Surgeon: Elam Dutch, MD;  Location: [facility] CATH LAB;  Service: Cardiovascular;  Laterality: N/A;  . Abdominal hysterectomy    . Tonsillectomy    . Cataract surgery Bilateral   . Colonoscopy    . Back surgery    . Posterior cervical fusion/foraminotomy N/A 01/28/2015    Procedure: POSTERIOR CERVICAL DECOMPRESSION FUSION LEVEL 5 WITH INSTRUMENTATION AND ALLOGRAFT;  Surgeon: Phylliss Bob, MD;  Location: [facility];  Service: Orthopedics;  Laterality: N/A;  Cervical posterior decompression fusion, cervical 3-4, cervical 4-5, cervical 5-6, cervical 6-7, cervical 6-thoracic 1 with instrumentation and allograft.  Social History History   Substance Use Topics   .  Smoking status:  Never Smoker    .  Smokeless tobacco:  Never Used   .  Alcohol Use:  No     Family History History reviewed. No pertinent family history.  Allergies  No Known Allergies     Current Outpatient Prescriptions on File Prior to Visit  Medication Sig Dispense Refill  . Acetaminophen-Codeine 300-30 MG tablet Take 1 tablet by mouth 3 (three) times daily as needed for pain. Reported on 07/29/2015    . albuterol (PROVENTIL HFA;VENTOLIN HFA) 108 (90 BASE) MCG/ACT inhaler Inhale 2 puffs into the lungs every 4 (four) hours as needed for wheezing or shortness of breath.      Marland Kitchen amLODipine (NORVASC) 5 MG tablet Take 5 mg by mouth 2 (two) times daily.     Marland Kitchen atenolol (TENORMIN) 25 MG tablet Take 12.5 mg by mouth daily.    Marland Kitchen atorvastatin (LIPITOR) 10 MG tablet Take 10 mg by mouth daily. Reported on 07/16/2015    . B Complex-C (B-COMPLEX WITH VITAMIN C) tablet Take 1 tablet by mouth daily. Reported on 05/21/2015    . cholecalciferol (VITAMIN D) 1000 units tablet Take 1,000 Units by mouth daily.    . clopidogrel (PLAVIX) 75 MG tablet Take 75 mg by mouth daily.    . cyclobenzaprine (FLEXERIL) 5 MG tablet Take 5 mg by mouth every 8 (eight) hours as needed for muscle spasms. Reported on 07/29/2015    . diazepam (VALIUM) 5 MG tablet Take 1 tablet (5 mg total) by mouth at bedtime. 3 tablet 0  . donepezil (ARICEPT) 5 MG tablet Take 10 mg by mouth at bedtime.    . famotidine (PEPCID) 20 MG tablet Take 20 mg by mouth 2 (two) times daily.   3  . gabapentin (NEURONTIN) 300 MG capsule Take 300 mg by mouth 3 (three) times daily. Reported on 06/04/2015    . Garlic 123XX123 MG CAPS Take 1 capsule by mouth daily.    . hydrochlorothiazide (HYDRODIURIL) 25 MG tablet Take 25 mg by mouth daily. Reported on 07/16/2015    . meloxicam (MOBIC) 15 MG tablet Take 15 mg by mouth daily.    . methocarbamol (ROBAXIN) 500 MG tablet Take 500 mg by mouth 2 (two) times daily as needed for muscle spasms.    . Misc Natural Products (GLUCOSAMINE CHOND COMPLEX/MSM PO) Take 1 tablet by mouth daily.    . Multiple Vitamin (MULTIVITAMIN WITH MINERALS) TABS tablet Take 1 tablet by mouth daily.    . Nutritional Supplements (NUTRITIONAL SUPPLEMENT PO) Take 1 capsule by mouth 2 (two) times daily. Lipogen PS Plus    . Omega 3 1000 MG CAPS Take 1,000 mg by mouth daily.    . sertraline (ZOLOFT) 50 MG tablet Take 50 mg by mouth at bedtime.    . sodium chloride (OCEAN) 0.65 % SOLN nasal spray Place 1 spray into both nostrils at bedtime as needed for congestion. Reported on 07/16/2015    . solifenacin (VESICARE) 5 MG tablet Take 5 mg  by mouth daily.    . traZODone (DESYREL) 50 MG tablet Take 50 mg by mouth at bedtime.     No current facility-administered medications on file prior to visit.    ROS:    General:  No weight loss, Fever, chills  HEENT: No recent headaches, no nasal bleeding, no visual changes, no sore throat  Neurologic: No dizziness, blackouts, seizures. No recent symptoms of stroke or mini- stroke.  No recent episodes of slurred speech, or temporary blindness.  Cardiac: No recent episodes of chest pain/pressure, no shortness of breath at rest.  No shortness of breath with exertion.  Denies history of atrial fibrillation or irregular heartbeat  Vascular: No history of rest pain in feet.  + history of claudication.  No history of non-healing ulcer, No history of DVT    Pulmonary: No home oxygen, no productive cough, no hemoptysis,  No asthma or wheezing  Musculoskeletal:  [ ]  Arthritis, [ ]  Low back pain,  [ ]  Joint pain  Hematologic:No history of hypercoagulable state.  No history of easy bleeding.  No history of anemia  Gastrointestinal: No hematochezia or melena,  No gastroesophageal reflux, no trouble swallowing  Urinary: [ ]  chronic Kidney disease, [ ]  on HD - [ ]  MWF or [ ]  TTHS, [ ]  Burning with urination, [ ]  Frequent urination, [ ]  Difficulty urinating;    Skin: No rashes  Psychological: No history of anxiety,  No history of depression   Physical Examination     Filed Vitals:   09/02/15 1459 09/02/15 1503  BP: 186/76 179/75  Pulse: 58 59  Temp: 97 F (36.1 C)   Resp: 18   Height: 5' 5.5" (1.664 m)   Weight: 142 lb (64.411 kg)   SpO2: 99%    General:  Alert and oriented, no acute distress Skin: No rashNo ulcer Extremity Pulses:  2+ radial, brachial, femoral, right dorsalis pedis, right posterior tibial absent left dorsalis pedis and posterior tibial pulse Musculoskeletal: No deformity or edema     Neurologic: Upper and lower extremity motor 5/5 and symmetric  DATA:  Patient  had bilateral ABIs on March 19 2013 which I reviewed today. She ABI left 0.9 a with triphasic waveforms right was 0.49 monophasic waveforms.  Today's ABIs are 0.97 on the right 0.91 on the left duplex ultrasound of the right above-knee popliteal stent is widely patent.   ASSESSMENT:  right leg pain unrelated to arterial occlusive disease most likely degenerative arthritis.   PLAN:  patient will follow-up in one year with repeat duplex of her stent in the right leg as well as bilateral ABIs.  Ruta Hinds, MD [facility] [place] Office: [phone] Pager: [phone]

## 2015-09-02 NOTE — Progress Notes (Signed)
Filed Vitals:   09/02/15 1459 09/02/15 1503  BP: 186/76 179/75  Pulse: 58 59  Temp: 97 F (36.1 C)   Resp: 18   Height: 5' 5.5" (1.664 m)   Weight: 142 lb (64.411 kg)   SpO2: 99%

## 2015-09-24 ENCOUNTER — Other Ambulatory Visit: Payer: Self-pay

## 2015-09-24 NOTE — Patient Outreach (Signed)
[facility]) Care Management  [place]   09/24/2015  Misty Patterson Apr 09, 1926 [id number]  Subjective: [name] is an 82 year old female who I am assisting with filling her pill box and monitoring her adherence. She is filling her medications with Pill Pack which is a benefit through her insurance [employer]. Today I am here to review how she has been doing with the pill packs plus the pill box.  She has been 100% adherence. She has taken all of her pill pack medications and the medications in her pill box which contains her gabapentin and Lipogen PS. She has received her new shipment which supposed to contain all of her medications except Lipogen PS. This shipment from [facility] contains gabapentin in the pack.  A note was sent in the box stating Misty Patterson needed to call Dr. Lysle Rubens to request a refill for Sertraline.      Objective:  Filed Vitals:   09/24/15 1109  BP: 130/63  Pulse: 58    Encounter Medications: Outpatient Encounter Prescriptions as of 09/24/2015  Medication Sig Note  . Acetaminophen-Codeine 300-30 MG tablet Take 1 tablet by mouth 3 (three) times daily as needed for pain. Reported on 07/29/2015   . albuterol (PROVENTIL HFA;VENTOLIN HFA) 108 (90 BASE) MCG/ACT inhaler Inhale 2 puffs into the lungs every 4 (four) hours as needed for wheezing or shortness of breath.    Marland Kitchen amLODipine (NORVASC) 5 MG tablet Take 5 mg by mouth 2 (two) times daily.    Marland Kitchen atenolol (TENORMIN) 25 MG tablet Take 12.5 mg by mouth daily.   Marland Kitchen atorvastatin (LIPITOR) 10 MG tablet Take 10 mg by mouth daily. Reported on 07/16/2015   . B Complex-C (B-COMPLEX WITH VITAMIN C) tablet Take 1 tablet by mouth daily. Reported on 05/21/2015   . cholecalciferol (VITAMIN D) 1000 units tablet Take 1,000 Units by mouth daily.   . clopidogrel (PLAVIX) 75 MG tablet Take 75 mg by mouth daily.   . cyclobenzaprine (FLEXERIL) 5 MG tablet Take 5 mg by mouth every 8 (eight) hours as needed  for muscle spasms. Reported on 07/29/2015   . diazepam (VALIUM) 5 MG tablet Take 1 tablet (5 mg total) by mouth at bedtime.   . donepezil (ARICEPT) 5 MG tablet Take 10 mg by mouth at bedtime.   . famotidine (PEPCID) 20 MG tablet Take 20 mg by mouth 2 (two) times daily.    Marland Kitchen gabapentin (NEURONTIN) 300 MG capsule Take 300 mg by mouth 3 (three) times daily. Reported on 06/04/2015   . Garlic 123XX123 MG CAPS Take 1 capsule by mouth daily.   . hydrochlorothiazide (HYDRODIURIL) 25 MG tablet Take 25 mg by mouth daily. Reported on 07/16/2015 04/28/2013: .  . meloxicam (MOBIC) 15 MG tablet Take 15 mg by mouth daily.   . methocarbamol (ROBAXIN) 500 MG tablet Take 500 mg by mouth 2 (two) times daily as needed for muscle spasms.   . Misc Natural Products (GLUCOSAMINE CHOND COMPLEX/MSM PO) Take 1 tablet by mouth daily.   . Multiple Vitamin (MULTIVITAMIN WITH MINERALS) TABS tablet Take 1 tablet by mouth daily.   . Nutritional Supplements (NUTRITIONAL SUPPLEMENT PO) Take 1 capsule by mouth 2 (two) times daily. Lipogen PS Plus   . Omega 3 1000 MG CAPS Take 1,000 mg by mouth daily.   . sertraline (ZOLOFT) 50 MG tablet Take 50 mg by mouth at bedtime.   . sodium chloride (OCEAN) 0.65 % SOLN nasal spray Place 1 spray into  both nostrils at bedtime as needed for congestion. Reported on 07/16/2015   . solifenacin (VESICARE) 5 MG tablet Take 5 mg by mouth daily.   . traZODone (DESYREL) 50 MG tablet Take 50 mg by mouth at bedtime.    No facility-administered encounter medications on file as of 09/24/2015.    Functional Status: In your present state of health, do you have any difficulty performing the following activities: 06/04/2015 05/11/2015  Hearing? Y N  Vision? N N  Difficulty concentrating or making decisions? [name]  Walking or climbing stairs? Y Y  Dressing or bathing? N N  Doing errands, shopping? [name]  Preparing Food and eating ? N N  Using the Toilet? N N  In the past six months, have you accidently leaked urine? Y N   Do you have problems with loss of bowel control? Y N  Managing your Medications? Y Y  Managing your Finances? [name]  Housekeeping or managing your Housekeeping? N Y    Fall/Depression Screening: PHQ 2/9 Scores 06/04/2015 05/11/2015  PHQ - 2 Score 0 0    Assessment: Medication adherence:  [name] is 100% adherent to her medications.  She is benefiting from Pill Pack.   Plan: 1.  [name] will continue to have her medications filled through [facility].   2.  I called Dr. Lysle Rubens and left a message with his nurse to have a prescription for sertraline called to [facility]. 3.  I will follow up in 4 weeks to assure she remains adherent.   If she continues to do well, I will close her at that time.    Deanne Coffer, PharmD, [place] [phone]

## 2015-09-27 ENCOUNTER — Other Ambulatory Visit: Payer: Self-pay | Admitting: Internal Medicine

## 2015-09-27 DIAGNOSIS — Z1231 Encounter for screening mammogram for malignant neoplasm of breast: Secondary | ICD-10-CM

## 2015-09-30 ENCOUNTER — Ambulatory Visit
Admission: RE | Admit: 2015-09-30 | Discharge: 2015-09-30 | Disposition: A | Discharge status: Home / Self Care | Payer: PPO | Source: Ambulatory Visit | Attending: Internal Medicine | Admitting: Internal Medicine

## 2015-09-30 DIAGNOSIS — Z1231 Encounter for screening mammogram for malignant neoplasm of breast: Secondary | ICD-10-CM | POA: Diagnosis not present

## 2015-10-13 DIAGNOSIS — F0390 Unspecified dementia without behavioral disturbance: Secondary | ICD-10-CM | POA: Diagnosis not present

## 2015-10-13 DIAGNOSIS — N183 Chronic kidney disease, stage 3 (moderate): Secondary | ICD-10-CM | POA: Diagnosis not present

## 2015-10-13 DIAGNOSIS — F322 Major depressive disorder, single episode, severe without psychotic features: Secondary | ICD-10-CM | POA: Diagnosis not present

## 2015-10-13 DIAGNOSIS — I10 Essential (primary) hypertension: Secondary | ICD-10-CM | POA: Diagnosis not present

## 2015-10-13 DIAGNOSIS — R7303 Prediabetes: Secondary | ICD-10-CM | POA: Diagnosis not present

## 2015-10-13 DIAGNOSIS — E782 Mixed hyperlipidemia: Secondary | ICD-10-CM | POA: Diagnosis not present

## 2015-10-13 DIAGNOSIS — I739 Peripheral vascular disease, unspecified: Secondary | ICD-10-CM | POA: Diagnosis not present

## 2015-10-13 DIAGNOSIS — C569 Malignant neoplasm of unspecified ovary: Secondary | ICD-10-CM | POA: Diagnosis not present

## 2015-10-26 DIAGNOSIS — M542 Cervicalgia: Secondary | ICD-10-CM | POA: Diagnosis not present

## 2015-10-29 ENCOUNTER — Other Ambulatory Visit: Payer: Self-pay

## 2015-11-02 DIAGNOSIS — I872 Venous insufficiency (chronic) (peripheral): Secondary | ICD-10-CM | POA: Diagnosis not present

## 2015-11-09 NOTE — Patient Outreach (Signed)
[facility]) Care Management  [place]   11/09/2015 Late entry for 10/29/15 Misty Patterson 02-Sep-1926 [id number]  Subjective: Misty Patterson is an 82 year old female I have been following for medication adherence.  Today I am here to assess her adherence one more time and close her case to pharmacy.  She is still using the pill pack and is doing very well with it.  All of her medications are in it now.  She is only complaining of some dizziness which she believes is coming from the gabapentin.  She has stopped taking it during the day.  She now only takes it at night to help her sleep.  She continues to take diazepam and acetaminophen-codeine as well.  I stated that these can also contribute to her dizziness.  She stated she only takes those when she needs to.  Otherwise she is doing well and is planning on going on a mission trip soon to [place].    Objective:   Encounter Medications: Outpatient Encounter Prescriptions as of 10/29/2015  Medication Sig Note  . Acetaminophen-Codeine 300-30 MG tablet Take 1 tablet by mouth 3 (three) times daily as needed for pain. Reported on 07/29/2015   . albuterol (PROVENTIL HFA;VENTOLIN HFA) 108 (90 BASE) MCG/ACT inhaler Inhale 2 puffs into the lungs every 4 (four) hours as needed for wheezing or shortness of breath.  10/29/2015: Has not needed  . amLODipine (NORVASC) 5 MG tablet Take 5 mg by mouth 2 (two) times daily.    Marland Kitchen atenolol (TENORMIN) 25 MG tablet Take 12.5 mg by mouth daily.   Marland Kitchen atorvastatin (LIPITOR) 10 MG tablet Take 10 mg by mouth daily. Reported on 07/16/2015   . B Complex-C (B-COMPLEX WITH VITAMIN C) tablet Take 1 tablet by mouth daily. Reported on 05/21/2015   . cholecalciferol (VITAMIN D) 1000 units tablet Take 1,000 Units by mouth daily.   . clopidogrel (PLAVIX) 75 MG tablet Take 75 mg by mouth daily.   . diazepam (VALIUM) 5 MG tablet Take 1 tablet (5 mg total) by mouth at bedtime.   . donepezil (ARICEPT) 5 MG tablet Take 10  mg by mouth at bedtime.   . famotidine (PEPCID) 20 MG tablet Take 20 mg by mouth 2 (two) times daily.    . Garlic 4098 MG CAPS Take 1 capsule by mouth daily.   . hydrochlorothiazide (HYDRODIURIL) 25 MG tablet Take 25 mg by mouth daily. Reported on 07/16/2015 04/28/2013: .  . meloxicam (MOBIC) 15 MG tablet Take 15 mg by mouth daily.   . Misc Natural Products (GLUCOSAMINE CHOND COMPLEX/MSM PO) Take 1 tablet by mouth daily.   . Multiple Vitamin (MULTIVITAMIN WITH MINERALS) TABS tablet Take 1 tablet by mouth daily.   . Nutritional Supplements (NUTRITIONAL SUPPLEMENT PO) Take 1 capsule by mouth 2 (two) times daily. Lipogen PS Plus   . Omega 3 1000 MG CAPS Take 1,000 mg by mouth daily.   . sertraline (ZOLOFT) 50 MG tablet Take 50 mg by mouth at bedtime.   . sodium chloride (OCEAN) 0.65 % SOLN nasal spray Place 1 spray into both nostrils at bedtime as needed for congestion. Reported on 07/16/2015   . solifenacin (VESICARE) 5 MG tablet Take 5 mg by mouth daily.   . traZODone (DESYREL) 50 MG tablet Take 50 mg by mouth at bedtime.   . gabapentin (NEURONTIN) 300 MG capsule Take 300 mg by mouth 3 (three) times daily. Reported on 06/04/2015   . methocarbamol (ROBAXIN) 500 MG tablet  Take 500 mg by mouth 2 (two) times daily as needed for muscle spasms.   . [DISCONTINUED] cyclobenzaprine (FLEXERIL) 5 MG tablet Take 5 mg by mouth every 8 (eight) hours as needed for muscle spasms. Reported on 07/29/2015    No facility-administered encounter medications on file as of 10/29/2015.     Functional Status: In your present state of health, do you have any difficulty performing the following activities: 06/04/2015 05/11/2015  Hearing? Y N  Vision? N N  Difficulty concentrating or making decisions? [name]  Walking or climbing stairs? Y Y  Dressing or bathing? N N  Doing errands, shopping? [name]  Preparing Food and eating ? N N  Using the Toilet? N N  In the past six months, have you accidently leaked urine? Y N  Do you have  problems with loss of bowel control? Y N  Managing your Medications? Y Y  Managing your Finances? [name]  Housekeeping or managing your Housekeeping? N Y  Some recent data might be hidden    Fall/Depression Screening: PHQ 2/9 Scores 06/04/2015 05/11/2015  PHQ - 2 Score 0 0    Assessment: Medication adherence:  She is 100% adherent to her medication.  The pill pack system is working very well for her.  She is able to manage her medication very well.    Plan: 1.  I congratulated [name] on doing such a great job on taking her medications and managing her medications.   2.  I will close her case to pharmacy.  I am happy to assist in the future if she has any other pharmacy needs.  She has my number if she has further questions or needs.   3.  I will send a case closure letter to Dr. Lysle Rubens informing him that all her needs have been met.    [facility] CM Care Plan Problem One   Flowsheet Row Most Recent Value  Care Plan Problem One  Medication Adherence  Role Documenting the Problem One  Clinical Pharmacist  Care Plan for Problem One  Not Active  [facility] Long Term Goal (31-90 days)  Ms. Walls will be adherent to her medications 80% of the time evident by review of her pill box review by the pharmacist or nurse over the next 90 days  [facility] Long Term Goal Start Date  05/11/15  [facility] Long Term Goal Met Date  08/27/15  Interventions for Problem One Long Term Goal  [name] has consistently taken her medications over the last month.  She was able to set up the [place] and will be receiving it in the next 2 weeks.  I was able to fill her pill box for another two weeks.  She is 100% adherent to her medications.  [facility] CM Short Term Goal #1 (0-30 days)  [name] will be adherent to her medications 80% of the time over the next 30 days evident by review of her pill box by the pharmacist over the next 30 days  [facility] CM Short Term Goal #1 Start Date  05/11/15  [facility] CM Short Term Goal #1 Met Date  06/04/15   Interventions for Short Term Goal #1  I filled Ms. Coote's pill box for a week and changed her from having four times a day medications to three times a day medications.  I instructed her on the importance of taking her medications daily.    [facility] CM Short Term Goal #2 (0-30 days)  Ms. Kallie Edward will be  adherent to her medications in both the pill pack and the pill box 91% of the time for the next 30 days evident by review by the pharmacist.   [facility] CM Short Term Goal #2 Start Date  07/29/15  [facility] CM Short Term Goal #2 Met Date  08/27/15  Interventions for Short Term Goal #2  I counseled Ms. Travieso on the fact that all but two of her medications were in the pillpack.  She will need to take both the pill pack and the pill box.  I made the pill pack start date for 07/30/15.  Ms. Plouffe was able to state she understood.      Deanne Coffer, PharmD, [place] [phone]

## 2015-11-30 DIAGNOSIS — M25512 Pain in left shoulder: Secondary | ICD-10-CM | POA: Diagnosis not present

## 2015-11-30 DIAGNOSIS — I872 Venous insufficiency (chronic) (peripheral): Secondary | ICD-10-CM | POA: Diagnosis not present

## 2015-12-14 ENCOUNTER — Ambulatory Visit (INDEPENDENT_AMBULATORY_CARE_PROVIDER_SITE_OTHER): Payer: PPO | Admitting: Family Medicine

## 2015-12-14 VITALS — BP 134/90 | HR 60 | Temp 97.8°F | Resp 17 | Ht 65.5 in | Wt 151.0 lb

## 2015-12-14 DIAGNOSIS — R5383 Other fatigue: Secondary | ICD-10-CM

## 2015-12-14 DIAGNOSIS — Z1329 Encounter for screening for other suspected endocrine disorder: Secondary | ICD-10-CM | POA: Diagnosis not present

## 2015-12-14 DIAGNOSIS — R635 Abnormal weight gain: Secondary | ICD-10-CM

## 2015-12-14 DIAGNOSIS — Z130 Encounter for screening for diseases of the blood and blood-forming organs and certain disorders involving the immune mechanism: Secondary | ICD-10-CM | POA: Diagnosis not present

## 2015-12-14 DIAGNOSIS — R6889 Other general symptoms and signs: Secondary | ICD-10-CM | POA: Diagnosis not present

## 2015-12-14 LAB — CBC
HEMATOCRIT: 40.5 % (ref 35.0–45.0)
HEMOGLOBIN: 13 g/dL (ref 11.7–15.5)
MCH: 28.3 pg (ref 27.0–33.0)
MCHC: 32.1 g/dL (ref 32.0–36.0)
MCV: 88.2 fL (ref 80.0–100.0)
MPV: 9.4 fL (ref 7.5–12.5)
Platelets: 201 10*3/uL (ref 140–400)
RBC: 4.59 MIL/uL (ref 3.80–5.10)
RDW: 14.7 % (ref 11.0–15.0)
WBC: 5.8 10*3/uL (ref 3.8–10.8)

## 2015-12-14 LAB — TSH: TSH: 6.13 mIU/L — ABNORMAL HIGH

## 2015-12-14 NOTE — Patient Instructions (Addendum)
Based on your weight gain, and fatigue, along with feeling cold, I will check thyroid test, but also checking your blood counts,  Kidney, liver tests, and other electrolytes to look into other causes of fatigue. If these do not indicate a cause, I would recommend you follow-up with your primary care provider to discuss other workup for these symptoms. Sometimes a visit with physical therapy to help with deconditioning may also be necessary, but this can be discussed with your primary care provider.  Return to the clinic or go to the nearest emergency room if any of your symptoms worsen or new symptoms occur.   Fatigue Fatigue is feeling tired all of the time, a lack of energy, or a lack of motivation. Occasional or mild fatigue is often a normal response to activity or life in general. However, long-lasting (chronic) or extreme fatigue may indicate an underlying medical condition. HOME CARE INSTRUCTIONS  Watch your fatigue for any changes. The following actions may help to lessen any discomfort you are feeling:  Talk to your health care provider about how much sleep you need each night. Try to get the required amount every night.  Take medicines only as directed by your health care provider.  Eat a healthy and nutritious diet. Ask your health care provider if you need help changing your diet.  Drink enough fluid to keep your urine clear or pale yellow.  Practice ways of relaxing, such as yoga, meditation, massage therapy, or acupuncture.  Exercise regularly.   Change situations that cause you stress. Try to keep your work and personal routine reasonable.  Do not abuse illegal drugs.  Limit alcohol intake to no more than 1 drink per day for nonpregnant women and 2 drinks per day for men. One drink equals 12 ounces of beer, 5 ounces of wine, or 1 ounces of hard liquor.  Take a multivitamin, if directed by your health care provider. SEEK MEDICAL CARE IF:   Your fatigue does not get  better.  You have a fever.   You have unintentional weight loss or gain.  You have headaches.   You have difficulty:   Falling asleep.  Sleeping throughout the night.  You feel angry, guilty, anxious, or sad.   You are unable to have a bowel movement (constipation).   You skin is dry.   Your legs or another part of your body is swollen.  SEEK IMMEDIATE MEDICAL CARE IF:   You feel confused.   Your vision is blurry.  You feel faint or pass out.   You have a severe headache.   You have severe abdominal, pelvic, or back pain.   You have chest pain, shortness of breath, or an irregular or fast heartbeat.   You are unable to urinate or you urinate less than normal.   You develop abnormal bleeding, such as bleeding from the rectum, vagina, nose, lungs, or nipples.  You vomit blood.   You have thoughts about harming yourself or committing suicide.   You are worried that you might harm someone else.    This information is not intended to replace advice given to you by your health care provider. Make sure you discuss any questions you have with your health care provider.   Document Released: 12/25/2006 Document Revised: 03/20/2014 Document Reviewed: 07/01/2013 Elsevier Interactive Patient Education 2016 [employer].   IF you received an x-ray today, you will receive an invoice from [facility]. Please contact [facility] at [phone] with questions or concerns regarding  your invoice.   IF you received labwork today, you will receive an invoice from [employer]. Please contact [facility] at [phone] with questions or concerns regarding your invoice.   Our billing staff will not be able to assist you with questions regarding bills from these companies.  You will be contacted with the lab results as soon as they are available. The fastest way to get your results is to activate your My Chart account.  Instructions are located on the last page of this paperwork. If you have not heard from [place] regarding the results in 2 weeks, please contact this office.

## 2015-12-14 NOTE — Progress Notes (Signed)
Subjective:  By signing my name below, I, Moises Blood, attest that this documentation has been prepared under the direction and in the presence of Merri Ray, MD. Electronically Signed: Moises Blood, [place]. 12/14/2015 , 11:06 AM .  Patient was seen in Room 11 .   Patient ID: Misty Patterson, female    DOB: 10-22-1926, 82 y.o.   MRN: [id number] Chief Complaint  Patient presents with  . Fatigue    Since feb. after surgery.    HPI Misty Patterson is a 82 y.o. female H/o peripheral vascular disease, dementia, peripheral neuropathy with myelopathy, HTN, and HLD. PCP is HUSAIN,KARRAR, MD. She was admitted in nov 2016 for a cervical decompression infusion (done by Dr. Lynann Bologna). She is under the care of [facility] care management and again, followed by Dr. Lysle Rubens. Based on phone notes from [facility] care management, there have been some medication adherence but improved with use of pill pack. Last visit by [facility] care management in August when her case was closed due to good adherence with medication. She did admit to some dizziness from the gabapentin but was also taking diazepam and codeine. She presents today for fatigue.   Fatigue Patient states she's been fatigue and feeling colder since her surgery 6 months ago. She has history of back and neck surgery. She reports she used to "be more active prior to the surgery, and just wants to get out there to do things". She feels like she isn't able to take deep breaths. She denies shortness of breath or hemoptysis. She denies thyroid problems in the past. She denies changes in her skin or hair.   Wt Readings from Last 3 Encounters:  12/14/15 151 lb (68.5 kg)  09/02/15 142 lb (64.4 kg)  05/21/15 140 lb (63.5 kg)   Right knee She also has right knee pain but was informed it was due to arthritis. She has occasional weakness with "going out on her" ever since her fall about 4 years ago. She denies any new weakness, slurred speech, facial droop or other  stroke-like symptoms. She hasn't been out to walk too far because she's afraid of falling.   She notes being seen by her PCP, Dr. Lysle Rubens, less than 2 months ago and informs having blood work done at that time.   Patient Active Problem List   Diagnosis Date Noted  . Myelopathy ([place]) 01/28/2015  . Chest pain 01/22/2015  . Abnormal ECG 01/22/2015  . Dementia 01/01/2015  . Cervical stenosis of spinal canal 01/01/2015  . Hereditary and idiopathic peripheral neuropathy 01/01/2015  . Radiculopathy 05/06/2014  . Pain of right lower leg 09/04/2013  . Paresthesia of both hands 09/04/2013  . Aftercare following surgery of the circulatory system, [place] 05/23/2013  . Muscle twitching-Right Leg 05/23/2013  . Peripheral vascular disease, unspecified 04/03/2013   Past Medical History:  Diagnosis Date  . Anxiety    takes Valium daily  . Cancer ([place])    ovarian  . Cervical disc disease   . Chronic back pain   . Chronic kidney disease   . COPD (chronic obstructive pulmonary disease) ([place])    ProAir daily as needed  . CTS (carpal tunnel syndrome)   . Decreased GFR   . Dementia    takes aricept  . Depression    takes Zoloft daily  . DJD (degenerative joint disease) of cervical spine   . DVT (deep venous thrombosis) ([facility])    right leg  . Dyslipidemia    but doesn't take  any meds   . Esophageal dysmotility   . GERD (gastroesophageal reflux disease)    takes pepcid daily  . Hypertension    takes Amlodipine and Atenolol daily  . Incontinence of urine   . Insomnia    takes Trazodone nightly  . Neuropathy ([place])   . Nocturia   . Osteoporosis    takes Vit D daily  . Ovarian cancer ([place])   . Peripheral neuropathy ([place])   . Peripheral vascular disease ([place])   . Pneumonia    hx of-15+yrs ago  . Radiculopathy   . Restless leg syndrome   . Urinary frequency   . Zenker's diverticulum    small   Past Surgical History:  Procedure Laterality Date  . ABDOMINAL AORTAGRAM  Feb. 20, 2015  .  ABDOMINAL AORTAGRAM N/A 05/02/2013   Procedure: ABDOMINAL Maxcine Ham;  Surgeon: Elam Dutch, MD;  Location: [facility] CATH LAB;  Service: Cardiovascular;  Laterality: N/A;  . ABDOMINAL HYSTERECTOMY    . BACK SURGERY    . cataract surgery Bilateral   . COLONOSCOPY    . POSTERIOR CERVICAL FUSION/FORAMINOTOMY N/A 01/28/2015   Procedure: POSTERIOR CERVICAL DECOMPRESSION FUSION LEVEL 5 WITH INSTRUMENTATION AND ALLOGRAFT;  Surgeon: Phylliss Bob, MD;  Location: [place];  Service: Orthopedics;  Laterality: N/A;  Cervical posterior decompression fusion, cervical 3-4, cervical 4-5, cervical 5-6, cervical 6-7, cervical 6-thoracic 1 with instrumentation and allograft.  . TONSILLECTOMY    . VEIN SURGERY  1950   Allergies  Allergen Reactions  . Aspirin Other (See Comments)    REACTION:  Choking sensation, possible throat swelling  . Formaldehyde Swelling    Tongue swelling  . Sodium Pentobarbital [Pentobarbital] Other (See Comments)    Memory Loss   Prior to Admission medications   Medication Sig Start Date End Date Taking? Authorizing Provider  Acetaminophen-Codeine 300-30 MG tablet Take 1 tablet by mouth 3 (three) times daily as needed for pain. Reported on 07/29/2015   Yes Historical Provider, MD  amLODipine (NORVASC) 5 MG tablet Take 5 mg by mouth 2 (two) times daily.    Yes Historical Provider, MD  atenolol (TENORMIN) 25 MG tablet Take 12.5 mg by mouth daily.   Yes Historical Provider, MD  atorvastatin (LIPITOR) 10 MG tablet Take 10 mg by mouth daily. Reported on 07/16/2015   Yes Historical Provider, MD  B Complex-C (B-COMPLEX WITH VITAMIN C) tablet Take 1 tablet by mouth daily. Reported on 05/21/2015   Yes Historical Provider, MD  cholecalciferol (VITAMIN D) 1000 units tablet Take 1,000 Units by mouth daily.   Yes Historical Provider, MD  clopidogrel (PLAVIX) 75 MG tablet Take 75 mg by mouth daily.   Yes Historical Provider, MD  diazepam (VALIUM) 5 MG tablet Take 1 tablet (5 mg total) by mouth at  bedtime. 02/13/15  Yes Daleen Bo, MD  donepezil (ARICEPT) 5 MG tablet Take 10 mg by mouth at bedtime.   Yes Historical Provider, MD  famotidine (PEPCID) 20 MG tablet Take 20 mg by mouth 2 (two) times daily.  06/10/14  Yes Historical Provider, MD  gabapentin (NEURONTIN) 300 MG capsule Take 300 mg by mouth 3 (three) times daily. Reported on 06/04/2015   Yes Historical Provider, MD  Garlic [id number] MG CAPS Take 1 capsule by mouth daily.   Yes Historical Provider, MD  hydrochlorothiazide (HYDRODIURIL) 25 MG tablet Take 25 mg by mouth daily. Reported on 07/16/2015 02/24/13  Yes Historical Provider, MD  meloxicam (MOBIC) 15 MG tablet Take 15 mg by mouth daily.  Yes Historical Provider, MD  Multiple Vitamin (MULTIVITAMIN WITH MINERALS) TABS tablet Take 1 tablet by mouth daily.   Yes Historical Provider, MD  Nutritional Supplements (NUTRITIONAL SUPPLEMENT PO) Take 1 capsule by mouth 2 (two) times daily. Lipogen PS Plus   Yes Historical Provider, MD  Omega 3 1000 MG CAPS Take 1,000 mg by mouth daily.   Yes Historical Provider, MD  sertraline (ZOLOFT) 50 MG tablet Take 50 mg by mouth at bedtime.   Yes Historical Provider, MD  sodium chloride (OCEAN) 0.65 % SOLN nasal spray Place 1 spray into both nostrils at bedtime as needed for congestion. Reported on 07/16/2015   Yes Historical Provider, MD  solifenacin (VESICARE) 5 MG tablet Take 5 mg by mouth daily.   Yes Historical Provider, MD  traZODone (DESYREL) 50 MG tablet Take 50 mg by mouth at bedtime.   Yes Historical Provider, MD  albuterol (PROVENTIL HFA;VENTOLIN HFA) 108 (90 BASE) MCG/ACT inhaler Inhale 2 puffs into the lungs every 4 (four) hours as needed for wheezing or shortness of breath.     Historical Provider, MD  methocarbamol (ROBAXIN) 500 MG tablet Take 500 mg by mouth 2 (two) times daily as needed for muscle spasms.    Historical Provider, MD  Misc Natural Products (GLUCOSAMINE CHOND COMPLEX/MSM PO) Take 1 tablet by mouth daily.    Historical Provider, MD    Social History   Social History  . Marital status: Widowed    Spouse name: N/A  . Number of children: 3  . Years of education: N/A   Occupational History  .  Retired   Social History Main Topics  . Smoking status: Never Smoker  . Smokeless tobacco: Never Used  . Alcohol use No  . Drug use: No  . Sexual activity: Not on file   Other Topics Concern  . Not on file   Social History Narrative   Lives with son.   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   Highest level of education:  11th grade   Review of Systems  Constitutional: Positive for activity change and fatigue. Negative for chills and fever.  Respiratory: Negative for cough, chest tightness, shortness of breath and wheezing.   Gastrointestinal: Negative for diarrhea, nausea and vomiting.  Endocrine: Positive for cold intolerance.  Musculoskeletal: Negative for gait problem.  Neurological: Positive for weakness. Negative for facial asymmetry, speech difficulty and numbness.       Objective:   Physical Exam  Constitutional: She is oriented to person, place, and time. She appears well-developed and well-nourished.  HENT:  Head: Normocephalic and atraumatic.  Eyes: Conjunctivae and EOM are normal. Pupils are equal, round, and reactive to light.  Neck: Carotid bruit is not present.  Cardiovascular: Normal rate, regular rhythm, normal heart sounds and intact distal pulses.   Pulmonary/Chest: Effort normal and breath sounds normal. No respiratory distress.  Abdominal: Soft. She exhibits no pulsatile midline mass. There is no tenderness.  Musculoskeletal:  Able to stand up for 2 seconds, able to ambulate  Neurological: She is alert and oriented to person, place, and time.  No focal weakness appreciated  Skin: Skin is warm and dry.  Psychiatric: She has a normal mood and affect. Her behavior is normal.  Vitals reviewed.   Vitals:   12/14/15 1026  BP: 134/90  Pulse: 60    Resp: 17  Temp: 97.8 F (36.6 C)  TempSrc: Oral  SpO2: 98%  Weight: 151 lb (68.5 kg)  Height: 5' 5.5" (1.664 m)      Assessment & Plan:   TYIANA MACCHIO is a 82 y.o. female Fatigue, unspecified type - Plan: CBC, TSH, COMPLETE METABOLIC PANEL WITH GFR  Cold intolerance - Plan: TSH  Weight gain, abnormal - Plan: TSH  Screening, anemia, deficiency, iron - Plan: CBC  Screening for thyroid disorder - Plan: TSH  fatigue/possible deconditioning. Nonfocal neuro exam. With cold intolerance will check TSH, CBC, and CMP to look for secondary causes of fatigue, but advised that she should follow-up with her primary care provider to determine next step, including possible trial of physical therapy if deconditioning suspected. Any new or change in symptoms should return here, emergency room, or discuss with primary care provider. Understanding expressed  No orders of the defined types were placed in this encounter.  Patient Instructions    Based on your weight gain, and fatigue, along with feeling cold, I will check thyroid test, but also checking your blood counts,  Kidney, liver tests, and other electrolytes to look into other causes of fatigue. If these do not indicate a cause, I would recommend you follow-up with your primary care provider to discuss other workup for these symptoms. Sometimes a visit with physical therapy to help with deconditioning may also be necessary, but this can be discussed with your primary care provider.  Return to the clinic or go to the nearest emergency room if any of your symptoms worsen or new symptoms occur.   Fatigue Fatigue is feeling tired all of the time, a lack of energy, or a lack of motivation. Occasional or mild fatigue is often a normal response to activity or life in general. However, long-lasting (chronic) or extreme fatigue may indicate an underlying medical condition. HOME CARE INSTRUCTIONS  Watch your fatigue for any changes. The following  actions may help to lessen any discomfort you are feeling:  Talk to your health care provider about how much sleep you need each night. Try to get the required amount every night.  Take medicines only as directed by your health care provider.  Eat a healthy and nutritious diet. Ask your health care provider if you need help changing your diet.  Drink enough fluid to keep your urine clear or pale yellow.  Practice ways of relaxing, such as yoga, meditation, massage therapy, or acupuncture.  Exercise regularly.   Change situations that cause you stress. Try to keep your work and personal routine reasonable.  Do not abuse illegal drugs.  Limit alcohol intake to no more than 1 drink per day for nonpregnant women and 2 drinks per day for men. One drink equals 12 ounces of beer, 5 ounces of wine, or 1 ounces of hard liquor.  Take a multivitamin, if directed by your health care provider. SEEK MEDICAL CARE IF:   Your fatigue does not get better.  You have a fever.   You have unintentional weight loss or gain.  You have headaches.   You have difficulty:   Falling asleep.  Sleeping throughout the night.  You feel angry, guilty, anxious, or sad.   You are unable to have a bowel movement (constipation).   You skin is dry.   Your legs or another part of your body is swollen.  SEEK IMMEDIATE MEDICAL CARE IF:   You feel confused.   Your vision is blurry.  You feel faint or pass out.   You have a severe headache.   You have severe abdominal, pelvic, or back pain.  You have chest pain, shortness of breath, or an irregular or fast heartbeat.   You are unable to urinate or you urinate less than normal.   You develop abnormal bleeding, such as bleeding from the rectum, vagina, nose, lungs, or nipples.  You vomit blood.   You have thoughts about harming yourself or committing suicide.   You are worried that you might harm someone else.    This  information is not intended to replace advice given to you by your health care provider. Make sure you discuss any questions you have with your health care provider.   Document Released: 12/25/2006 Document Revised: 03/20/2014 Document Reviewed: 07/01/2013 Elsevier Interactive Patient Education 2016 [employer].   IF you received an x-ray today, you will receive an invoice from [facility]. Please contact [facility] at [phone] with questions or concerns regarding your invoice.   IF you received labwork today, you will receive an invoice from [employer]. Please contact [facility] at [phone] with questions or concerns regarding your invoice.   Our billing staff will not be able to assist you with questions regarding bills from these companies.  You will be contacted with the lab results as soon as they are available. The fastest way to get your results is to activate your My Chart account. Instructions are located on the last page of this paperwork. If you have not heard from [place] regarding the results in 2 weeks, please contact this office.        I personally performed the services described in this documentation, which was scribed in my presence. The recorded information has been reviewed and considered, and addended by me as needed.   Signed,   Merri Ray, MD [facility].  12/15/15 12:52 PM

## 2015-12-15 LAB — COMPLETE METABOLIC PANEL WITH GFR
ALBUMIN: 4.4 g/dL (ref 3.6–5.1)
ALK PHOS: 52 U/L (ref 33–130)
ALT: 17 U/L (ref 6–29)
AST: 31 U/L (ref 10–35)
BILIRUBIN TOTAL: 0.4 mg/dL (ref 0.2–1.2)
BUN: 32 mg/dL — ABNORMAL HIGH (ref 7–25)
CALCIUM: 9.3 mg/dL (ref 8.6–10.4)
CO2: 30 mmol/L (ref 20–31)
CREATININE: 1.45 mg/dL — AB (ref 0.60–0.88)
Chloride: 102 mmol/L (ref 98–110)
GFR, EST AFRICAN AMERICAN: 37 mL/min — AB (ref >=60)
GFR, EST NON AFRICAN AMERICAN: 32 mL/min — AB (ref >=60)
Glucose, Bld: 95 mg/dL (ref 65–99)
Potassium: 4.3 mmol/L (ref 3.5–5.3)
Sodium: 140 mmol/L (ref 135–146)
TOTAL PROTEIN: 7.1 g/dL (ref 6.1–8.1)

## 2015-12-20 DIAGNOSIS — M9901 Segmental and somatic dysfunction of cervical region: Secondary | ICD-10-CM | POA: Diagnosis not present

## 2015-12-20 DIAGNOSIS — M5412 Radiculopathy, cervical region: Secondary | ICD-10-CM | POA: Diagnosis not present

## 2015-12-22 DIAGNOSIS — M9901 Segmental and somatic dysfunction of cervical region: Secondary | ICD-10-CM | POA: Diagnosis not present

## 2015-12-22 DIAGNOSIS — M5412 Radiculopathy, cervical region: Secondary | ICD-10-CM | POA: Diagnosis not present

## 2015-12-24 DIAGNOSIS — M5412 Radiculopathy, cervical region: Secondary | ICD-10-CM | POA: Diagnosis not present

## 2015-12-24 DIAGNOSIS — M9901 Segmental and somatic dysfunction of cervical region: Secondary | ICD-10-CM | POA: Diagnosis not present

## 2015-12-27 ENCOUNTER — Ambulatory Visit: Payer: PPO

## 2015-12-27 DIAGNOSIS — M9901 Segmental and somatic dysfunction of cervical region: Secondary | ICD-10-CM | POA: Diagnosis not present

## 2015-12-27 DIAGNOSIS — M5412 Radiculopathy, cervical region: Secondary | ICD-10-CM | POA: Diagnosis not present

## 2015-12-28 ENCOUNTER — Other Ambulatory Visit: Payer: Self-pay

## 2015-12-28 ENCOUNTER — Ambulatory Visit (INDEPENDENT_AMBULATORY_CARE_PROVIDER_SITE_OTHER): Payer: PPO | Admitting: Family Medicine

## 2015-12-28 ENCOUNTER — Ambulatory Visit (INDEPENDENT_AMBULATORY_CARE_PROVIDER_SITE_OTHER): Payer: PPO | Admitting: Orthopedic Surgery

## 2015-12-28 VITALS — BP 130/66 | HR 77 | Temp 97.5°F | Resp 18 | Ht 65.5 in | Wt 150.0 lb

## 2015-12-28 DIAGNOSIS — F0390 Unspecified dementia without behavioral disturbance: Secondary | ICD-10-CM

## 2015-12-28 DIAGNOSIS — R7989 Other specified abnormal findings of blood chemistry: Secondary | ICD-10-CM

## 2015-12-28 DIAGNOSIS — I87323 Chronic venous hypertension (idiopathic) with inflammation of bilateral lower extremity: Secondary | ICD-10-CM | POA: Diagnosis not present

## 2015-12-28 DIAGNOSIS — M542 Cervicalgia: Secondary | ICD-10-CM | POA: Diagnosis not present

## 2015-12-28 DIAGNOSIS — Z9189 Other specified personal risk factors, not elsewhere classified: Secondary | ICD-10-CM

## 2015-12-28 DIAGNOSIS — Z7722 Contact with and (suspected) exposure to environmental tobacco smoke (acute) (chronic): Secondary | ICD-10-CM

## 2015-12-28 DIAGNOSIS — E039 Hypothyroidism, unspecified: Secondary | ICD-10-CM | POA: Diagnosis not present

## 2015-12-28 DIAGNOSIS — I10 Essential (primary) hypertension: Secondary | ICD-10-CM | POA: Diagnosis not present

## 2015-12-28 LAB — BASIC METABOLIC PANEL
BUN: 26 mg/dL — AB (ref 7–25)
CHLORIDE: 103 mmol/L (ref 98–110)
CO2: 29 mmol/L (ref 20–31)
CREATININE: 1.28 mg/dL — AB (ref 0.60–0.88)
Calcium: 9.3 mg/dL (ref 8.6–10.4)
Glucose, Bld: 124 mg/dL — ABNORMAL HIGH (ref 65–99)
Potassium: 3.9 mmol/L (ref 3.5–5.3)
Sodium: 141 mmol/L (ref 135–146)

## 2015-12-28 MED ORDER — GABAPENTIN 300 MG PO CAPS: 300.0000 mg | ORAL_CAPSULE | Freq: Every day | ORAL | 3 refills | Status: DC

## 2015-12-28 MED ORDER — LEVOTHYROXINE SODIUM 25 MCG PO TABS: 25.0000 ug | ORAL_TABLET | Freq: Every day | ORAL | 0 refills | Status: DC

## 2015-12-28 MED ORDER — ALBUTEROL SULFATE HFA 108 (90 BASE) MCG/ACT IN AERS: 2.0000 | INHALATION_SPRAY | RESPIRATORY_TRACT | 3 refills | Status: DC | PRN
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
## 2015-12-28 NOTE — Patient Outreach (Signed)
Ms. Schacht called me to tell me that she just saw a new provider, Dr. Nolon Rod, and really liked seeing her.  She stated she reduced her Gabapentin to 300 mg at bedtime.  She stated she wanted me to call Dr. Nolon Rod and let her know how I got her medications into the pill pack.  I told her I would.  I will send Dr. Nolon Rod a message through inbasket since she is in [place].  I told Ms. Melena it was a pleasure talking to her and I would be happy to assist her in the future again if needed.   Deanne Coffer, PharmD, [facility] [phone]

## 2015-12-28 NOTE — Patient Instructions (Addendum)
LEVOTHYROXINE YOUR THYROID MEDICATION  This new medication is a new daily medication Your should take this medication at 9am then the rest of your pills one hour later at 10am She should take the thyroid medication on an empty stomach  Return to clinic in 2 months to recheck your labs   IF you received an x-ray today, you will receive an invoice from [facility]. Please contact [facility] at [phone] with questions or concerns regarding your invoice.   IF you received labwork today, you will receive an invoice from [employer]. Please contact [facility] at [phone] with questions or concerns regarding your invoice.   Our billing staff will not be able to assist you with questions regarding bills from these companies.  You will be contacted with the lab results as soon as they are available. The fastest way to get your results is to activate your My Chart account. Instructions are located on the last page of this paperwork. If you have not heard from [place] regarding the results in 2 weeks, please contact this office.     Hypothyroidism Hypothyroidism is a disorder of the thyroid. The thyroid is a large gland that is located in the lower front of the neck. The thyroid releases hormones that control how the body works. With hypothyroidism, the thyroid does not make enough of these hormones. CAUSES Causes of hypothyroidism may include:  Viral infections.  Pregnancy.  Your own defense system (immune system) attacking your thyroid.  Certain medicines.  Birth defects.  Past radiation treatments to your head or neck.  Past treatment with radioactive iodine.  Past surgical removal of part or all of your thyroid.  Problems with the gland that is located in the center of your brain (pituitary). SIGNS AND SYMPTOMS Signs and symptoms of hypothyroidism may include:  Feeling as though you have no energy (lethargy).  Inability to tolerate  cold.  Weight gain that is not explained by a change in diet or exercise habits.  Dry skin.  Coarse hair.  Menstrual irregularity.  Slowing of thought processes.  Constipation.  Sadness or depression. DIAGNOSIS  Your health care provider may diagnose hypothyroidism with blood tests and ultrasound tests. TREATMENT Hypothyroidism is treated with medicine that replaces the hormones that your body does not make. After you begin treatment, it may take several weeks for symptoms to go away. HOME CARE INSTRUCTIONS   Take medicines only as directed by your health care provider.  If you start taking any new medicines, tell your health care provider.  Keep all follow-up visits as directed by your health care provider. This is important. As your condition improves, your dosage needs may change. You will need to have blood tests regularly so that your health care provider can watch your condition. SEEK MEDICAL CARE IF:  Your symptoms do not get better with treatment.  You are taking thyroid replacement medicine and:  You sweat excessively.  You have tremors.  You feel anxious.  You lose weight rapidly.  You cannot tolerate heat.  You have emotional swings.  You have diarrhea.  You feel weak. SEEK IMMEDIATE MEDICAL CARE IF:   You develop chest pain.  You develop an irregular heartbeat.  You develop a rapid heartbeat.   This information is not intended to replace advice given to you by your health care provider. Make sure you discuss any questions you have with your health care provider.   Document Released: 02/27/2005 Document Revised: 03/20/2014 Document Reviewed: 07/15/2013 Elsevier Interactive  Patient Education 2016 [employer].

## 2015-12-29 ENCOUNTER — Telehealth: Payer: Self-pay | Admitting: Family Medicine

## 2015-12-29 DIAGNOSIS — M9901 Segmental and somatic dysfunction of cervical region: Secondary | ICD-10-CM | POA: Diagnosis not present

## 2015-12-29 DIAGNOSIS — M5412 Radiculopathy, cervical region: Secondary | ICD-10-CM | POA: Diagnosis not present

## 2015-12-29 NOTE — Telephone Encounter (Signed)
Left message with the pharmacy that the next pill pack should include levothyroxine  Her gabapentin should be decreased to gabapentin 300mg  at bedtime only

## 2016-01-18 DIAGNOSIS — Z85828 Personal history of other malignant neoplasm of skin: Secondary | ICD-10-CM | POA: Diagnosis not present

## 2016-01-18 DIAGNOSIS — L570 Actinic keratosis: Secondary | ICD-10-CM | POA: Diagnosis not present

## 2016-01-18 DIAGNOSIS — L821 Other seborrheic keratosis: Secondary | ICD-10-CM | POA: Diagnosis not present

## 2016-01-27 ENCOUNTER — Encounter (INDEPENDENT_AMBULATORY_CARE_PROVIDER_SITE_OTHER): Payer: Self-pay | Admitting: Orthopedic Surgery

## 2016-01-27 ENCOUNTER — Ambulatory Visit (INDEPENDENT_AMBULATORY_CARE_PROVIDER_SITE_OTHER): Payer: PPO | Admitting: Orthopedic Surgery

## 2016-01-27 VITALS — Ht 65.0 in | Wt 150.0 lb

## 2016-01-27 DIAGNOSIS — M62838 Other muscle spasm: Secondary | ICD-10-CM | POA: Diagnosis not present

## 2016-01-27 DIAGNOSIS — I739 Peripheral vascular disease, unspecified: Secondary | ICD-10-CM

## 2016-01-27 NOTE — Progress Notes (Signed)
Office Visit Note   Patient: Misty Patterson           Date of Birth: [birth year]           MRN: [id number] Visit Date: 01/27/2016              Requested by: Wenda Low, MD [address] [place],  [address] PCP: Wenda Low, MD   Assessment & Plan: Visit Diagnoses:  1. Trapezius muscle spasm   2. Peripheral vascular disease ([place])     Plan: Recommended heat and tylenol. Has muscle relaxers at home. Can use these as needed. Discussed compression stockings again for VSI.  Follow-Up Instructions: Return if symptoms worsen or fail to improve.   Orders:  No orders of the defined types were placed in this encounter.  No orders of the defined types were placed in this encounter.     Procedures: No procedures performed   Clinical Data: No additional findings.   Subjective: Chief Complaint  Patient presents with  . Neck - Follow-up    Cervical spine spondylosis     Patient states that she is having left sided neck pain and shoulder pain. She is follow up for neck pain. She states that there is a " knot" on the shoulder. Has been using a heating blanket for left sided shoulder/back pain. Denies neck pain or symptoms today. No numbness in hands at current.   She did not purchase compression hose. She was having VSI and does not states that she is having any problems. Feels the cold weather has helped with her LE swelling.     Review of Systems  Constitutional: Negative for chills and fever.  All other systems reviewed and are negative.    Objective: Vital Signs: Ht 5\' 5"  (1.651 m)   Wt 150 lb (68 kg)   BMI 24.96 kg/m   Physical Exam  Constitutional: She is oriented to person, place, and time. She appears well-developed and well-nourished.  Neck: Muscular tenderness present. No spinous process tenderness present. No edema and normal range of motion present.  Left trapezius tenderness.  Pulmonary/Chest: Effort normal.  Neurological: She is  alert and oriented to person, place, and time.  Psychiatric: She has a normal mood and affect.  Nursing note reviewed.   Left Shoulder Exam  Left shoulder exam is normal.  Tenderness  The patient is experiencing no tenderness.     Range of Motion  The patient has normal left shoulder ROM.  Tests  Drop Arm: negative Hawkin's test: negative Impingement: negative    Bilateral lower extremities with brawny skin color changes. There is no swelling today.  Specialty Comments:  No specialty comments available.  Imaging: No results found.   PMFS History: Patient Active Problem List   Diagnosis Date Noted  . Myelopathy ([place]) 01/28/2015  . Chest pain 01/22/2015  . Abnormal ECG 01/22/2015  . Dementia 01/01/2015  . Cervical stenosis of spinal canal 01/01/2015  . Hereditary and idiopathic peripheral neuropathy 01/01/2015  . Radiculopathy 05/06/2014  . Pain of right lower leg 09/04/2013  . Paresthesia of both hands 09/04/2013  . Aftercare following surgery of the circulatory system, [place] 05/23/2013  . Muscle twitching-Right Leg 05/23/2013  . Peripheral vascular disease ([place]) 04/03/2013   Past Medical History:  Diagnosis Date  . Anxiety    takes Valium daily  . Cancer ([place])    ovarian  . Cervical disc disease   . Chronic back pain   . Chronic kidney disease   .  COPD (chronic obstructive pulmonary disease) ([place])    ProAir daily as needed  . CTS (carpal tunnel syndrome)   . Decreased GFR   . Dementia    takes aricept  . Depression    takes Zoloft daily  . DJD (degenerative joint disease) of cervical spine   . DVT (deep venous thrombosis) (HCC)    right leg  . Dyslipidemia    but doesn't take any meds   . Esophageal dysmotility   . GERD (gastroesophageal reflux disease)    takes pepcid daily  . Hypertension    takes Amlodipine and Atenolol daily  . Incontinence of urine   . Insomnia    takes Trazodone nightly  . Neuropathy ([place])   . Nocturia   .  Osteoporosis    takes Vit D daily  . Ovarian cancer ([place])   . Peripheral neuropathy ([place])   . Peripheral vascular disease ([place])   . Pneumonia    hx of-15+yrs ago  . Radiculopathy   . Restless leg syndrome   . Urinary frequency   . Zenker's diverticulum    small    Family History  Problem Relation Age of Onset  . Hypertension Mother   . Stroke Mother   . Lung cancer Son     Deceased, 52  . Healthy Son   . Other Father     MVA  . Hypertension Brother   . Stroke Brother   . Kidney disease Sister   . Stroke Brother   . Diabetes Brother     DM  . Stroke Brother   . Cancer Sister     throat    Past Surgical History:  Procedure Laterality Date  . ABDOMINAL AORTAGRAM  Feb. 20, 2015  . ABDOMINAL AORTAGRAM N/A 05/02/2013   Procedure: ABDOMINAL Maxcine Ham;  Surgeon: Elam Dutch, MD;  Location: [facility] CATH LAB;  Service: Cardiovascular;  Laterality: N/A;  . ABDOMINAL HYSTERECTOMY    . BACK SURGERY    . cataract surgery Bilateral   . COLONOSCOPY    . POSTERIOR CERVICAL FUSION/FORAMINOTOMY N/A 01/28/2015   Procedure: POSTERIOR CERVICAL DECOMPRESSION FUSION LEVEL 5 WITH INSTRUMENTATION AND ALLOGRAFT;  Surgeon: Phylliss Bob, MD;  Location: [place];  Service: Orthopedics;  Laterality: N/A;  Cervical posterior decompression fusion, cervical 3-4, cervical 4-5, cervical 5-6, cervical 6-7, cervical 6-thoracic 1 with instrumentation and allograft.  . TONSILLECTOMY    . [place]   Social History   Occupational History  .  Retired   Social History Main Topics  . Smoking status: Never Smoker  . Smokeless tobacco: Never Used  . Alcohol use No  . Drug use: No  . Sexual activity: Not on file

## 2016-02-01 NOTE — Addendum Note (Signed)
Addended by: Lianne Cure A on: 02/01/2016 08:58 AM   Modules accepted: Orders

## 2016-03-01 ENCOUNTER — Ambulatory Visit: Payer: PPO | Admitting: Family Medicine

## 2016-03-15 ENCOUNTER — Ambulatory Visit (INDEPENDENT_AMBULATORY_CARE_PROVIDER_SITE_OTHER): Payer: PPO | Admitting: Internal Medicine

## 2016-03-15 ENCOUNTER — Encounter: Payer: Self-pay | Admitting: Internal Medicine

## 2016-03-15 ENCOUNTER — Telehealth: Payer: Self-pay

## 2016-03-15 VITALS — BP 139/80 | HR 60 | Temp 97.3°F | Ht 65.0 in | Wt 149.0 lb

## 2016-03-15 DIAGNOSIS — E782 Mixed hyperlipidemia: Secondary | ICD-10-CM | POA: Diagnosis not present

## 2016-03-15 DIAGNOSIS — E034 Atrophy of thyroid (acquired): Secondary | ICD-10-CM | POA: Diagnosis not present

## 2016-03-15 DIAGNOSIS — Z79899 Other long term (current) drug therapy: Secondary | ICD-10-CM | POA: Diagnosis not present

## 2016-03-15 DIAGNOSIS — M2550 Pain in unspecified joint: Secondary | ICD-10-CM | POA: Diagnosis not present

## 2016-03-15 DIAGNOSIS — I739 Peripheral vascular disease, unspecified: Secondary | ICD-10-CM | POA: Diagnosis not present

## 2016-03-15 DIAGNOSIS — F339 Major depressive disorder, recurrent, unspecified: Secondary | ICD-10-CM

## 2016-03-15 DIAGNOSIS — N3281 Overactive bladder: Secondary | ICD-10-CM | POA: Diagnosis not present

## 2016-03-15 DIAGNOSIS — M25561 Pain in right knee: Secondary | ICD-10-CM | POA: Diagnosis not present

## 2016-03-15 DIAGNOSIS — R7303 Prediabetes: Secondary | ICD-10-CM

## 2016-03-15 DIAGNOSIS — F411 Generalized anxiety disorder: Secondary | ICD-10-CM

## 2016-03-15 DIAGNOSIS — F0390 Unspecified dementia without behavioral disturbance: Secondary | ICD-10-CM | POA: Diagnosis not present

## 2016-03-15 DIAGNOSIS — R0602 Shortness of breath: Secondary | ICD-10-CM | POA: Diagnosis not present

## 2016-03-15 DIAGNOSIS — G8929 Other chronic pain: Secondary | ICD-10-CM

## 2016-03-15 LAB — CBC WITH DIFFERENTIAL/PLATELET
BASOS ABS: 64 {cells}/uL (ref 0–200)
Basophils Relative: 1 %
EOS ABS: 128 {cells}/uL (ref 15–500)
EOS PCT: 2 %
HCT: 42.6 % (ref 35.0–45.0)
HEMOGLOBIN: 13.6 g/dL (ref 11.7–15.5)
LYMPHS ABS: 2368 {cells}/uL (ref 850–3900)
Lymphocytes Relative: 37 %
MCH: 28.9 pg (ref 27.0–33.0)
MCHC: 31.9 g/dL — AB (ref 32.0–36.0)
MCV: 90.4 fL (ref 80.0–100.0)
MONO ABS: 640 {cells}/uL (ref 200–950)
MPV: 9 fL (ref 7.5–12.5)
Monocytes Relative: 10 %
NEUTROS ABS: 3200 {cells}/uL (ref 1500–7800)
NEUTROS PCT: 50 %
Platelets: 219 10*3/uL (ref 140–400)
RBC: 4.71 MIL/uL (ref 3.80–5.10)
RDW: 14 % (ref 11.0–15.0)
WBC: 6.4 10*3/uL (ref 3.8–10.8)

## 2016-03-15 LAB — HEMOGLOBIN A1C
Hgb A1c MFr Bld: 5.7 % — ABNORMAL HIGH (ref <5.7)
Mean Plasma Glucose: 117 mg/dL

## 2016-03-15 LAB — T4, FREE: Free T4: 1.2 ng/dL (ref 0.8–1.8)

## 2016-03-15 LAB — TSH: TSH: 3.89 m[IU]/L

## 2016-03-15 MED ORDER — ACETAMINOPHEN-CODEINE #3 300-30 MG PO TABS: 1.0000 | ORAL_TABLET | Freq: Three times a day (TID) | ORAL | 0 refills | Status: DC | PRN

## 2016-03-15 NOTE — Progress Notes (Signed)
Patient ID: Misty Patterson, female   DOB: 1926/08/30, 83 y.o.   MRN: [id number]    Location:  [facility] Place of Service: OFFICE    Advanced Directive information    Chief Complaint  Patient presents with  . Establish Care    New patient to establish care  . Other    patient has knot on head said she fell at [facility], she also stated that she has numbness in her hands     HPI:  83 yo female seen today as a new pt. She has some concerns. She fell at [facility] flat onto her face several years ago. Since fall, she has paresthesias in hands and pain in RLE and base of neck. She has had neck and back sx by Dr Lynann Bologna in the past. She saw Mills Koller in Nov 2017 for trapezius spasms. No injections given at that time  Anxiety - She drove to office alone today and states "I prayed all the way here" due to increased anxiety. She has dementia and takes aricept. Per CMA, pt brought several Rx bottles to OV that are not in med list from previous PCP Dr Lysle Rubens. She takes valium qhs prn  She reports increased SOB since her fall several yrs ago in [place].   She is a poor historian due to dementia. Hx obtained from chart and old records.  Chronic back/neck pain - due to cervical spinal stenosis, peripheral neuropathy, radiculopathy and RLE. Followed by Ortho Dr Sharol Given and Dr Lynann Bologna  Dementia - takes aricept. Compliance with medication is questionable. No recent MMSE  Prediabetes - A1c 5.8% in Mar 2017  Hx ovarian CA - tx >30 yrs ago but does not recall exact tx. In remission  PAD - has RLE pain. Takes plavix. She is s/p right above-knee popliteal artery stenting in 2015. In June 2017, ABI on right 0.97; left 0.91. Followed by vascular sx Dr Oneida Alar  Hypothyroidism - dx in Oct 2017 by Dr Nolon Rod. She was started on synthroid 78mg daily but does not recall repeat labs and unsure if she is taking medication now  MDD - stable on sertraline  OAB - stable on vesicare  Hyperlipidemia - diet controlled  Past  Medical History:  Diagnosis Date  . Anxiety    takes Valium daily  . Cancer ([place]    ovarian  . Cervical disc disease   . Chronic back pain   . Chronic kidney disease   . COPD (chronic obstructive pulmonary disease) ([place]    ProAir daily as needed  . CTS (carpal tunnel syndrome)   . Decreased GFR   . Dementia    takes aricept  . Depression    takes Zoloft daily  . DJD (degenerative joint disease) of cervical spine   . DVT (deep venous thrombosis) (HCC)    right leg  . Dyslipidemia    but doesn't take any meds   . Esophageal dysmotility   . GERD (gastroesophageal reflux disease)    takes pepcid daily  . Hypertension    takes Amlodipine and Atenolol daily  . Incontinence of urine   . Insomnia    takes Trazodone nightly  . Neuropathy ([place]   . Nocturia   . Osteoporosis    takes Vit D daily  . Ovarian cancer ([place]   . Peripheral neuropathy ([place]   . Peripheral vascular disease ([place]   . Pneumonia    hx of-15+yrs ago  . Radiculopathy   . Restless leg syndrome   .  Urinary frequency   . Zenker's diverticulum    small    Past Surgical History:  Procedure Laterality Date  . ABDOMINAL AORTAGRAM  Feb. 20, 2015  . ABDOMINAL AORTAGRAM N/A 05/02/2013   Procedure: ABDOMINAL Maxcine Ham;  Surgeon: Elam Dutch, MD;  Location: [facility]ATH LAB;  Service: Cardiovascular;  Laterality: N/A;  . ABDOMINAL HYSTERECTOMY    . BACK SURGERY    . cataract surgery Bilateral   . COLONOSCOPY    . POSTERIOR CERVICAL FUSION/FORAMINOTOMY N/A 01/28/2015   Procedure: POSTERIOR CERVICAL DECOMPRESSION FUSION LEVEL 5 WITH INSTRUMENTATION AND ALLOGRAFT;  Surgeon: Phylliss Bob, MD;  Location: [place];  Service: Orthopedics;  Laterality: N/A;  Cervical posterior decompression fusion, cervical 3-4, cervical 4-5, cervical 5-6, cervical 6-7, cervical 6-thoracic 1 with instrumentation and allograft.  . TONSILLECTOMY    . [place]    Patient Care Team: Wenda Low, MD as PCP - General (Internal  Medicine) [place], LCSW as [place] [employer]  Social History   Social History  . Marital status: Widowed    Spouse name: N/A  . Number of children: 3  . Years of education: N/A   Occupational History  .  Retired   Social History Main Topics  . Smoking status: Never Smoker  . Smokeless tobacco: Never Used  . Alcohol use No  . Drug use: No  . Sexual activity: No   Other Topics Concern  . Not on file   Social History Narrative   Diet? no      Do you drink/eat things with caffeine?  Coffee, 1 cup      Marital status?    widow                                What year were you married? 17      Do you live in a house, apartment, assisted living, condo, trailer, etc.? House      Is it one or more stories? 1      How many persons live in your home?       Do you have any pets in your home? (please list)  1      Current or past profession: hair dresser      Do you exercise?                yes                      Type & how often? Walk now in house      Do you have a living will? yes      Do you have a DNR form?                                  If not, do you want to discuss one?      Do you have signed POA/HPOA for forms?         reports that she has never smoked. She has never used smokeless tobacco. She reports that she does not drink alcohol or use drugs.  Family History  Problem Relation Age of Onset  . Hypertension Mother   . Stroke Mother   . Lung cancer Son     Deceased, 64  . Other Father     MVA  . Hypertension Brother   .  Stroke Brother   . Kidney disease Sister   . Stroke Brother   . Diabetes Brother     DM  . Stroke Brother   . Cancer Sister     throat  . Healthy Son    Family Status  Relation Status  . Mother Deceased at age 37  . Son Deceased at age 10  . Father Deceased at age 1  . Brother Deceased  . Sister Deceased  . Brother Deceased  . Brother Deceased  . Sister Deceased  . Sister Deceased  .  Brother Alive  . Son     Immunization History  Administered Date(s) Administered  . Influenza-Unspecified 12/10/2015  . Tdap 02/10/2015    Allergies  Allergen Reactions  . Aspirin Other (See Comments)    REACTION:  Choking sensation, possible throat swelling  . Formaldehyde Swelling    Tongue swelling  . Sodium Pentobarbital [Pentobarbital] Other (See Comments)    Memory Loss    Medications: Patient's Medications  New Prescriptions   No medications on file  Previous Medications   ACETAMINOPHEN-CODEINE 300-30 MG TABLET    Take 1 tablet by mouth 2 (two) times daily. Reported on 07/29/2015   AMLODIPINE (NORVASC) 5 MG TABLET    Take 5 mg by mouth 2 (two) times daily.    B COMPLEX-C (B-COMPLEX WITH VITAMIN C) TABLET    Take 1 tablet by mouth daily. Reported on 05/21/2015   CHOLECALCIFEROL (VITAMIN D) 1000 UNITS TABLET    Take 1,000 Units by mouth daily.   CLOPIDOGREL (PLAVIX) 75 MG TABLET    Take 75 mg by mouth daily.   CYCLOBENZAPRINE (FLEXERIL) 5 MG TABLET    Take 5 mg by mouth every 8 (eight) hours as needed for muscle spasms.   DIAZEPAM (VALIUM) 5 MG TABLET    Take 1 tablet (5 mg total) by mouth at bedtime.   GABAPENTIN (NEURONTIN) 300 MG CAPSULE    Take 1 capsule (300 mg total) by mouth at bedtime.   GABAPENTIN (NEURONTIN) 600 MG TABLET    Take 600 mg by mouth at bedtime.   LEVOTHYROXINE (SYNTHROID, LEVOTHROID) 25 MCG TABLET    Take 1 tablet (25 mcg total) by mouth daily before breakfast.   MELOXICAM (MOBIC) 15 MG TABLET    Take 15 mg by mouth every morning.    MELOXICAM (MOBIC) 7.5 MG TABLET    Take 7.5 mg by mouth daily as needed for pain.   METHOCARBAMOL (ROBAXIN) 500 MG TABLET    Take 500 mg by mouth 2 (two) times daily as needed for muscle spasms.    MISC NATURAL PRODUCTS (GLUCOSAMINE CHOND COMPLEX/MSM PO)    Take 1 tablet by mouth daily.   MISC NATURAL PRODUCTS (SINUS FORMULA) TABS    Take 1 tablet by mouth daily as needed.   MULTIPLE VITAMIN (MULTIVITAMIN WITH MINERALS)  TABS TABLET    Take 1 tablet by mouth daily.   NUTRITIONAL SUPPLEMENTS (NUTRITIONAL SUPPLEMENT PO)    Take 1 capsule by mouth 2 (two) times daily. Lipogen PS Plus   OMEGA 3 1000 MG CAPS    Take 1,000 mg by mouth daily.   SERTRALINE (ZOLOFT) 50 MG TABLET    Take 50 mg by mouth at bedtime.   SODIUM CHLORIDE (OCEAN) 0.65 % SOLN NASAL SPRAY    Place 1 spray into both nostrils at bedtime as needed for congestion. Reported on 07/16/2015   SOLIFENACIN (VESICARE) 5 MG TABLET    Take 5 mg by mouth daily.  TRAZODONE (DESYREL) 50 MG TABLET    Take 50 mg by mouth at bedtime.   UNABLE TO FIND    Med Name: Curell (supplement) Take 1 by mouth daily.  Modified Medications   No medications on file  Discontinued Medications   ALBUTEROL (PROVENTIL HFA;VENTOLIN HFA) 108 (90 BASE) MCG/ACT INHALER    Inhale 2 puffs into the lungs every 4 (four) hours as needed for wheezing or shortness of breath.   ATENOLOL (TENORMIN) 25 MG TABLET    Take 12.5 mg by mouth daily.   ATORVASTATIN (LIPITOR) 10 MG TABLET    Take 10 mg by mouth daily. Reported on 07/16/2015   DONEPEZIL (ARICEPT) 5 MG TABLET    Take 10 mg by mouth at bedtime.   FAMOTIDINE (PEPCID) 20 MG TABLET    Take 20 mg by mouth 2 (two) times daily.    GARLIC 2094 MG CAPS    Take 1 capsule by mouth daily.   HYDROCHLOROTHIAZIDE (HYDRODIURIL) 25 MG TABLET    Take 25 mg by mouth daily. Reported on 07/16/2015    Review of Systems  Constitutional: Positive for fatigue and unexpected weight change (gain).  HENT: Positive for dental problem (wears dentures), sinus pressure and trouble swallowing.   Eyes: Positive for visual disturbance (wears glasses).       Dry eyes  Respiratory: Positive for shortness of breath.   Gastrointestinal:       Gas  Genitourinary: Positive for difficulty urinating (weak stream) and frequency.  Musculoskeletal: Positive for arthralgias and myalgias.  Neurological: Positive for weakness and numbness (hand).       Loss of balance; memory loss    Hematological: Bruises/bleeds easily.  All other systems reviewed and are negative. taken from new patient packet  Vitals:   03/15/16 1100  BP: 139/80  Pulse: 60  Temp: 97.3 F (36.3 C)  TempSrc: Oral  SpO2: 98%  Weight: 149 lb (67.6 kg)  Height: '5\' 5"'  (1.651 m)   Body mass index is 24.79 kg/m.  Physical Exam  Constitutional: She appears well-developed and well-nourished.  HENT:  Mouth/Throat: Oropharynx is clear and moist. No oropharyngeal exudate.  Eyes: Pupils are equal, round, and reactive to light. No scleral icterus.  Neck: Neck supple. Muscular tenderness present. Carotid bruit is not present. Decreased range of motion present. No tracheal deviation present. No thyromegaly present.    Cardiovascular: Normal rate, regular rhythm and intact distal pulses.  Exam reveals no gallop and no friction rub.   Murmur (1/6 SEM) heard. No LE edema b/l. no calf TTP.   Pulmonary/Chest: Effort normal and breath sounds normal. No stridor. No respiratory distress. She has no wheezes. She has no rales.  Reduced BS at base b/l  Abdominal: Soft. Bowel sounds are normal. She exhibits no distension and no mass. There is no hepatomegaly. There is no tenderness. There is no rebound and no guarding.  Musculoskeletal: She exhibits edema (right knee with reduced ROM), tenderness and deformity (mild thoracic kyphosis).  Lymphadenopathy:    She has no cervical adenopathy.  Neurological: She is alert.  Skin: Skin is warm and dry. No rash noted.  Psychiatric: Her behavior is normal. Judgment and thought content normal. Her mood appears anxious. Cognition and memory are impaired.     Labs reviewed: Office Visit on 12/28/2015  Component Date Value Ref Range Status  . Sodium 12/28/2015 141  135 - 146 mmol/L Final  . Potassium 12/28/2015 3.9  3.5 - 5.3 mmol/L Final  . Chloride 12/28/2015 103  98 - 110 mmol/L Final  . CO2 12/28/2015 29  20 - 31 mmol/L Final  . Glucose, Bld 12/28/2015 124* 65 - 99  mg/dL Final  . BUN 12/28/2015 26* 7 - 25 mg/dL Final  . Creat 12/28/2015 1.28* 0.60 - 0.88 mg/dL Final   Comment:   For patients > or = 81 years of age: The upper reference limit for Creatinine is approximately 13% higher for people identified as African-American.     . Calcium 12/28/2015 9.3  8.6 - 10.4 mg/dL Final    No results found.   Assessment/Plan   ICD-9-CM ICD-10-CM   1. Chronic pain of right knee 719.46 M25.561 DG Knee Complete 4 Views Right   338.29 G89.29   2. Dementia without behavioral disturbance, unspecified dementia type 294.20 F03.90 CMP with eGFR     CBC with Differential/Platelets     TSH     Urinalysis with Reflex Microscopic  3. High risk medication use V58.69 Z79.899 CMP with eGFR     CBC with Differential/Platelets     TSH     Urinalysis with Reflex Microscopic  4. Anxiety state 300.00 F41.1 TSH     T4, Free  5. Recurrent major depressive disorder, remission status unspecified ([place]) 296.30 F33.9   6. Prediabetes 790.29 R73.03 Hemoglobin A1c  7. Hypothyroidism due to acquired atrophy of thyroid 244.8 E03.4 TSH   246.8  T4, Free  8. PAD (peripheral artery disease) (HCC) 443.9 I73.9   9. OAB (overactive bladder) 596.51 N32.81   10. Shortness of breath 786.05 R06.02    due to COPD  11. Mixed hyperlipidemia 272.2 E78.2 Lipid Panel  12. Pain in joint involving multiple sites 719.49 M25.50 acetaminophen-codeine (TYLENOL #3) 300-30 MG tablet   STOP ARICEPT/DONEPEZIL  START NAMZARIC TITRATION PACk for memory loss  NO DRIVING DUE TO MEMORY LOSS!  Continue other medications as ordered  Will call with lab results  Please have your son Dellis Filbert come with you to your next appt  Follow up in 1 month for dementia   Sonnia Strong S. Perlie Gold  [facility] [address] [place], [place] [address] [phone] Cell (Monday-Friday 8 AM - 5 PM) [phone] After 5 PM and follow prompts

## 2016-03-15 NOTE — Telephone Encounter (Signed)
Patient was here as a new patient to establish care and I noticed that patient was confused and not a good historian when updating information. I discussed this with the doctor prior to her seeing the patient.  Per Dr.Carter: concerned about patient driving, please call someone to see if they would be able to assist patient home.  I called patients son and Amy Lovena Le who are both on emergency contact list and left messages on both phones.  I called Son Merry Proud back and he stated that he was at work and was to far out and wanted me to call Melburn Popper to get her home. He also mentioned to call Elmyra Ricks but he did not have the number.   Will call Amy Lovena Le again to see if I get a answer.  Left another message for Amy Lovena Le

## 2016-03-15 NOTE — Patient Instructions (Addendum)
STOP ARICEPT/DONEPEZIL  START NAMZARIC TITRATION PACk for memory loss  NO DRIVING DUE TO MEMORY LOSS!  Continue other medications as ordered  Will call with lab results  Please have your son Dellis Filbert come with you to your next appt  Follow up in 1 month for dementia  Donepezil; Memantine extended-release capsule What is this medicine? DONEPEZIL; MEMANTINE (doe NEP e zil; MEM an teen) is used to treat dementia caused by Alzheimer's disease. COMMON BRAND NAME(S): Namzaric What should I tell my health care provider before I take this medicine? They need to know if you have any of these conditions: -difficulty passing urine -head injury -heart disease -irregular heartbeat -kidney disease -liver disease -lung or breathing disease, like asthma -seizures -stomach or intestinal disease, ulcers or stomach bleeding -an unusual or allergic reaction to donepezil, memantine, other medicines, foods, dyes, or preservatives -pregnant or trying to get pregnant -breast-feeding How should I use this medicine? Take this medicine by mouth with a glass of water. Follow the directions on the prescription label. You may take this medicine with or without food. Swallow the capsules whole. Do not chew or crush. If swallowing is difficult, you may open the capsules and sprinkle the entire contents on cool applesauce before swallowing. Take your doses at regular intervals. Do not take your medicine more often than directed. Continue to take your medicine even if you feel better. Do not stop taking except on the advice of your doctor or health care professional. Talk to your pediatrician regarding the use of this medicine in children. Special care may be needed. What if I miss a dose? If you miss a dose, take it as soon as you can. If it is almost time for your next dose, take only that dose. Do not take double or extra doses. If you do not take your medicine for several days, contact your health care  provider. Your dose may need to be changed. What may interact with this medicine? Do not take this medicine with any of the following medications: -certain medicines for fungal infections like itraconazole, fluconazole, posaconazole, and voriconazole -cisapride -dextromethorphan; quinidine -dofetilide -dronedarone -pimozide -quinidine -thioridazine -ziprasidone This medicine may also interact with the following medications: -acetazolamide -antihistamines for allergy, cough and cold -atropine -bethanechol -carbamazepine -certain medicines for bladder problems like oxybutynin, tolterodine -certain medicines for Parkinson's disease like benztropine, trihexyphenidyl -certain medicines for stomach problems like dicyclomine, hyoscyamine -certain medicines for travel sickness like scopolamine -cimetidine -dexamethasone -hydrochlorothiazide (HCTZ) -ketamine -ipratropium -metformin -methazolamide -nicotine -NSAIDs, medicines for pain and inflammation, like ibuprofen or naproxen -other medicines for Alzheimer's disease -other medicines that prolong the QT interval (cause an abnormal heart rhythm) -phenobarbital -phenytoin -ranitidine -rifampin, rifabutin or rifapentine -sodium bicarbonate -succinylcholine -triamterene What should I watch for while using this medicine? Visit your doctor or health care professional for regular checks on your progress. Check with your doctor or health care professional if there is no improvement in your symptoms or if they get worse. You may get drowsy or dizzy. Do not drive, use machinery, or do anything that needs mental alertness until you know how this drug affects you. Do not stand or sit up quickly, especially if you are an older patient. This reduces the risk of dizzy or fainting spells. Alcohol can make you more drowsy and dizzy. Avoid alcoholic drinks. If you are going to need surgery or other procedure, tell your doctor that you are using this  medicine. What side effects may I notice from receiving this medicine? Side  effects that you should report to your doctor or health care professional as soon as possible: -allergic reactions like skin rash, itching or hives, swelling of the face, lips, or tongue -changes in vision -depressed mood -feeling faint or lightheaded, falls -hallucinations -problems with balance -redness, blistering, peeling or loosening of the skin, including inside the mouth -seizures -slow heartbeat, or palpitations -stomach pain -unusual bleeding or bruising, red or purple spots on the skin -vomiting -weight loss Side effects that usually do not require medical attention (report to your doctor or health care professional if they continue or are bothersome): -diarrhea -dizziness -headache -indigestion or heartburn -loss of appetite -nausea Where should I keep my medicine? Keep out of the reach of children. Store at room temperature between 15 and 30 degrees C (59 and 86 degrees F). Throw away any unused medicine after the expiration date.  2017 Elsevier/Gold Standard (2015-04-01 11:45:49)

## 2016-03-15 NOTE — Telephone Encounter (Signed)
Spoke with Misty Patterson of [name] I informed her that Dr. Eulas Post had some concerns about her driving due to some confusing she had today in the office Misty stated that she was on the way to get her.

## 2016-03-16 LAB — COMPLETE METABOLIC PANEL WITH GFR
ALBUMIN: 4.5 g/dL (ref 3.6–5.1)
ALK PHOS: 53 U/L (ref 33–130)
ALT: 20 U/L (ref 6–29)
AST: 26 U/L (ref 10–35)
BUN: 29 mg/dL — AB (ref 7–25)
CHLORIDE: 103 mmol/L (ref 98–110)
CO2: 25 mmol/L (ref 20–31)
Calcium: 9.5 mg/dL (ref 8.6–10.4)
Creat: 1.3 mg/dL — ABNORMAL HIGH (ref 0.60–0.88)
GFR, EST NON AFRICAN AMERICAN: 37 mL/min — AB (ref >=60)
GFR, Est African American: 43 mL/min — ABNORMAL LOW (ref >=60)
GLUCOSE: 139 mg/dL — AB (ref 65–99)
POTASSIUM: 4 mmol/L (ref 3.5–5.3)
SODIUM: 142 mmol/L (ref 135–146)
Total Bilirubin: 0.4 mg/dL (ref 0.2–1.2)
Total Protein: 7.1 g/dL (ref 6.1–8.1)

## 2016-03-16 LAB — LIPID PANEL
CHOL/HDL RATIO: 3 ratio (ref <5.0)
Cholesterol: 188 mg/dL (ref <200)
HDL: 62 mg/dL (ref >50)
LDL CALC: 81 mg/dL (ref <100)
TRIGLYCERIDES: 225 mg/dL — AB (ref <150)
VLDL: 45 mg/dL — ABNORMAL HIGH (ref <30)

## 2016-03-16 LAB — URINALYSIS, ROUTINE W REFLEX MICROSCOPIC
Bilirubin Urine: NEGATIVE
Glucose, UA: NEGATIVE
Hgb urine dipstick: NEGATIVE
KETONES UR: NEGATIVE
Leukocytes, UA: NEGATIVE
NITRITE: NEGATIVE
Protein, ur: NEGATIVE
SPECIFIC GRAVITY, URINE: 1.023 (ref 1.001–1.035)
pH: 5 (ref 5.0–8.0)

## 2016-04-26 ENCOUNTER — Ambulatory Visit: Payer: Self-pay | Admitting: Internal Medicine

## 2016-05-10 ENCOUNTER — Telehealth: Payer: Self-pay | Admitting: *Deleted

## 2016-05-10 NOTE — Telephone Encounter (Signed)
noted 

## 2016-05-10 NOTE — Telephone Encounter (Signed)
Patient called and stated that Dr. Eulas Post wanted her son to be at her appointment on Friday and he won't be able to. He told her that he was not going to lose his job by bringing her to the Dr. Lenon Ahmadi that is it is raining he will be here but otherwise he will not be. Patient stated that she will try to have her daughter bring her but she had a heart transplant and Malita Ignasiak not be able to. She just wanted to call you and give you heads up.

## 2016-05-12 ENCOUNTER — Ambulatory Visit (INDEPENDENT_AMBULATORY_CARE_PROVIDER_SITE_OTHER): Payer: PPO | Admitting: Internal Medicine

## 2016-05-12 ENCOUNTER — Encounter: Payer: Self-pay | Admitting: Internal Medicine

## 2016-05-12 VITALS — BP 150/72 | HR 64 | Temp 97.4°F | Ht 65.0 in | Wt 148.8 lb

## 2016-05-12 DIAGNOSIS — I739 Peripheral vascular disease, unspecified: Secondary | ICD-10-CM | POA: Diagnosis not present

## 2016-05-12 DIAGNOSIS — Z79899 Other long term (current) drug therapy: Secondary | ICD-10-CM | POA: Diagnosis not present

## 2016-05-12 DIAGNOSIS — M79661 Pain in right lower leg: Secondary | ICD-10-CM

## 2016-05-12 DIAGNOSIS — G8929 Other chronic pain: Secondary | ICD-10-CM | POA: Diagnosis not present

## 2016-05-12 DIAGNOSIS — Z86718 Personal history of other venous thrombosis and embolism: Secondary | ICD-10-CM | POA: Diagnosis not present

## 2016-05-12 DIAGNOSIS — F0390 Unspecified dementia without behavioral disturbance: Secondary | ICD-10-CM

## 2016-05-12 DIAGNOSIS — M25561 Pain in right knee: Secondary | ICD-10-CM | POA: Diagnosis not present

## 2016-05-12 DIAGNOSIS — M2550 Pain in unspecified joint: Secondary | ICD-10-CM

## 2016-05-12 MED ORDER — ACETAMINOPHEN-CODEINE #3 300-30 MG PO TABS: 1.0000 | ORAL_TABLET | Freq: Three times a day (TID) | ORAL | 0 refills | Status: DC | PRN

## 2016-05-12 MED ORDER — MEMANTINE HCL-DONEPEZIL HCL ER 28-10 MG PO CP24: 1.0000 | ORAL_CAPSULE | Freq: Every day | ORAL | 6 refills | Status: DC

## 2016-05-12 NOTE — Progress Notes (Signed)
Patient ID: Misty Patterson, female   DOB: 1926-08-11, 83 y.o.   MRN: [id number]    Location:  [facility] Place of Service: OFFICE  Chief Complaint  Patient presents with  . Medical Management of Chronic Issues    1 month follow up for dementia here with Deloris Meekins neighbor  . Medication Management    patient unsure if she is taking both Meloxicam doses, she states she finished Namzaric titration pack    HPI:  83 yo female seen today for f/u dementia. She completed namzaric titration last week. No diarrhea, nausea or nightmares. She does "freeze to death" at times. Labs last month revealed no anemia, thyroid stable. BS prediabetic with A1c 5.7%.  She has RLE pain at times. Her son did not accompany her to her appt today. Neighbor is present instead. She is a poor historian due to dementia. Hx obtained from chart   She fell at [facility] flat onto her face several years ago. Since fall, she has paresthesias in hands and pain in RLE and base of neck. She has had neck and back sx by Dr Lynann Bologna in the past. She saw Mills Koller in Nov 2017 for trapezius spasms. No injections given at that time  Anxiety - She takes valium qhs prn  She reports increased SOB since her fall several yrs ago in [place].   Chronic back/neck pain - due to cervical spinal stenosis, peripheral neuropathy, radiculopathy and RLE. Followed by Ortho Dr Sharol Given and Dr Lynann Bologna  Dementia - tried namzaric without ADRs. MMSE 18/30 in Jan 2018.  Prediabetes - A1c 5.7% in Mar 2017  Hx ovarian CA - tx >30 yrs ago but does not recall exact tx. In remission  PAD - has RLE pain. Takes plavix. She is s/p right above-knee popliteal artery stenting in 2015. In June 2017, ABI on right 0.97; left 0.91. Followed by vascular sx Dr Oneida Alar  Hypothyroidism - dx in Oct 2017 by Dr Nolon Rod. She was started on synthroid 4mg daily but does not recall repeat labs and unsure if she is taking medication now  MDD - stable on sertraline  OAB - stable on  vesicare  Hyperlipidemia - diet controlled   Past Medical History:  Diagnosis Date  . Anxiety    takes Valium daily  . Cancer ([facility]    ovarian  . Cervical disc disease   . Chronic back pain   . Chronic kidney disease   . COPD (chronic obstructive pulmonary disease) ([facility]    ProAir daily as needed  . CTS (carpal tunnel syndrome)   . Decreased GFR   . Dementia    takes aricept  . Depression    takes Zoloft daily  . DJD (degenerative joint disease) of cervical spine   . DVT (deep venous thrombosis) (HCC)    right leg  . Dyslipidemia    but doesn't take any meds   . Esophageal dysmotility   . GERD (gastroesophageal reflux disease)    takes pepcid daily  . Hypertension    takes Amlodipine and Atenolol daily  . Incontinence of urine   . Insomnia    takes Trazodone nightly  . Neuropathy ([facility]   . Nocturia   . Osteoporosis    takes Vit D daily  . Ovarian cancer ([facility]   . Peripheral neuropathy ([facility]   . Peripheral vascular disease ([facility]   . Pneumonia    hx of-15+yrs ago  . Radiculopathy   . Restless leg syndrome   . Urinary frequency   .  Zenker's diverticulum    small    Past Surgical History:  Procedure Laterality Date  . ABDOMINAL AORTAGRAM  Feb. 20, 2015  . ABDOMINAL AORTAGRAM N/A 05/02/2013   Procedure: ABDOMINAL Maxcine Ham;  Surgeon: Elam Dutch, MD;  Location: [facility] CATH LAB;  Service: Cardiovascular;  Laterality: N/A;  . ABDOMINAL HYSTERECTOMY    . BACK SURGERY    . cataract surgery Bilateral   . COLONOSCOPY    . POSTERIOR CERVICAL FUSION/FORAMINOTOMY N/A 01/28/2015   Procedure: POSTERIOR CERVICAL DECOMPRESSION FUSION LEVEL 5 WITH INSTRUMENTATION AND ALLOGRAFT;  Surgeon: Phylliss Bob, MD;  Location: [facility];  Service: Orthopedics;  Laterality: N/A;  Cervical posterior decompression fusion, cervical 3-4, cervical 4-5, cervical 5-6, cervical 6-7, cervical 6-thoracic 1 with instrumentation and allograft.  . TONSILLECTOMY    . [place]    Patient  Care Team: Gildardo Cranker, DO as PCP - General (Internal Medicine) [place], LCSW as [facility]  Social History   Social History  . Marital status: Widowed    Spouse name: N/A  . Number of children: 3  . Years of education: N/A   Occupational History  .  Retired   Social History Main Topics  . Smoking status: Never Smoker  . Smokeless tobacco: Never Used  . Alcohol use No  . Drug use: No  . Sexual activity: No   Other Topics Concern  . Not on file   Social History Narrative   Diet? no      Do you drink/eat things with caffeine?  Coffee, 1 cup      Marital status?    widow                                What year were you married? 17      Do you live in a house, apartment, assisted living, condo, trailer, etc.? House      Is it one or more stories? 1      How many persons live in your home?       Do you have any pets in your home? (please list)  1      Current or past profession: hair dresser      Do you exercise?                yes                      Type & how often? Walk now in house      Do you have a living will? yes      Do you have a DNR form?                                  If not, do you want to discuss one?      Do you have signed POA/HPOA for forms?         reports that she has never smoked. She has never used smokeless tobacco. She reports that she does not drink alcohol or use drugs.  Family History  Problem Relation Age of Onset  . Hypertension Mother   . Stroke Mother   . Lung cancer Son     Deceased, 32  . Other Father     MVA  . Hypertension Brother   . Stroke Brother   .  Kidney disease Sister   . Stroke Brother   . Diabetes Brother     DM  . Stroke Brother   . Cancer Sister     throat  . Healthy Son    Family Status  Relation Status  . Mother Deceased at age 64  . Son Deceased at age 55  . Father Deceased at age 25  . Brother Deceased  . Sister Deceased  . Brother Deceased  .  Brother Deceased  . Sister Deceased  . Sister Deceased  . Brother Alive  . Son      Allergies  Allergen Reactions  . Aspirin Other (See Comments)    REACTION:  Choking sensation, possible throat swelling  . Formaldehyde Swelling    Tongue swelling  . Sodium Pentobarbital [Pentobarbital] Other (See Comments)    Memory Loss    Medications: Patient's Medications  New Prescriptions   No medications on file  Previous Medications   ACETAMINOPHEN-CODEINE (TYLENOL #3) 300-30 MG TABLET    Take 1 tablet by mouth every 8 (eight) hours as needed for moderate pain.   ACETAMINOPHEN-CODEINE 300-30 MG TABLET    Take 1 tablet by mouth 2 (two) times daily. Reported on 07/29/2015   AMLODIPINE (NORVASC) 5 MG TABLET    Take 5 mg by mouth 2 (two) times daily.    B COMPLEX-C (B-COMPLEX WITH VITAMIN C) TABLET    Take 1 tablet by mouth daily. Reported on 05/21/2015   CHOLECALCIFEROL (VITAMIN D) 1000 UNITS TABLET    Take 1,000 Units by mouth daily.   CLOPIDOGREL (PLAVIX) 75 MG TABLET    Take 75 mg by mouth daily.   CYCLOBENZAPRINE (FLEXERIL) 5 MG TABLET    Take 5 mg by mouth every 8 (eight) hours as needed for muscle spasms.   DIAZEPAM (VALIUM) 5 MG TABLET    Take 1 tablet (5 mg total) by mouth at bedtime.   GABAPENTIN (NEURONTIN) 600 MG TABLET    Take 600 mg by mouth at bedtime.   LEVOTHYROXINE (SYNTHROID, LEVOTHROID) 25 MCG TABLET    Take 1 tablet (25 mcg total) by mouth daily before breakfast.   MELOXICAM (MOBIC) 15 MG TABLET    Take 15 mg by mouth every morning.    MELOXICAM (MOBIC) 7.5 MG TABLET    Take 7.5 mg by mouth daily as needed for pain.   METHOCARBAMOL (ROBAXIN) 500 MG TABLET    Take 500 mg by mouth 2 (two) times daily as needed for muscle spasms.    MISC NATURAL PRODUCTS (GLUCOSAMINE CHOND COMPLEX/MSM PO)    Take 1 tablet by mouth daily.   MISC NATURAL PRODUCTS (SINUS FORMULA) TABS    Take 1 tablet by mouth daily as needed.   MULTIPLE VITAMIN (MULTIVITAMIN WITH MINERALS) TABS TABLET    Take 1  tablet by mouth daily.   NUTRITIONAL SUPPLEMENTS (NUTRITIONAL SUPPLEMENT PO)    Take 1 capsule by mouth 2 (two) times daily. Lipogen PS Plus   OMEGA 3 1000 MG CAPS    Take 1,000 mg by mouth daily.   SERTRALINE (ZOLOFT) 50 MG TABLET    Take 50 mg by mouth at bedtime.   SODIUM CHLORIDE (OCEAN) 0.65 % SOLN NASAL SPRAY    Place 1 spray into both nostrils at bedtime as needed for congestion. Reported on 07/16/2015   SOLIFENACIN (VESICARE) 5 MG TABLET    Take 5 mg by mouth daily.   TRAZODONE (DESYREL) 50 MG TABLET    Take 50 mg by mouth at bedtime.  UNABLE TO FIND    Med Name: Curell (supplement) Take 1 by mouth daily.  Modified Medications   No medications on file  Discontinued Medications   GABAPENTIN (NEURONTIN) 300 MG CAPSULE    Take 1 capsule (300 mg total) by mouth at bedtime.    Review of Systems  Unable to perform ROS: Dementia    Vitals:   05/12/16 0940  BP: (!) 150/72  Pulse: 64  Temp: 97.4 F (36.3 C)  TempSrc: Oral  SpO2: 97%  Weight: 148 lb 12.8 oz (67.5 kg)  Height: '5\' 5"'  (1.651 m)   Body mass index is 24.76 kg/m.  Physical Exam  Constitutional: She appears well-developed and well-nourished.  HENT:  Mouth/Throat: Oropharynx is clear and moist. No oropharyngeal exudate.  Eyes: Pupils are equal, round, and reactive to light. No scleral icterus.  Neck: Neck supple. Muscular tenderness present. Carotid bruit is not present. Decreased range of motion present. No tracheal deviation present. No thyromegaly present.    Cardiovascular: Normal rate, regular rhythm and intact distal pulses.  Exam reveals no gallop and no friction rub.   Murmur (1/6 SEM) heard. No LE edema b/l. no calf TTP.   Pulmonary/Chest: Effort normal and breath sounds normal. No stridor. No respiratory distress. She has no wheezes. She has no rales.  Reduced BS at base b/l  Abdominal: Soft. Bowel sounds are normal. She exhibits no distension and no mass. There is no hepatomegaly. There is no tenderness.  There is no rebound and no guarding.  Musculoskeletal: She exhibits edema (right knee with reduced ROM), tenderness and deformity (mild thoracic kyphosis).  Lymphadenopathy:    She has no cervical adenopathy.  Neurological: She is alert.  Skin: Skin is warm and dry. No rash noted.  Psychiatric: Her behavior is normal. Judgment and thought content normal. Her mood appears anxious. Cognition and memory are impaired.     Labs reviewed: Office Visit on 03/15/2016  Component Date Value Ref Range Status  . Sodium 03/15/2016 142  135 - 146 mmol/L Final  . Potassium 03/15/2016 4.0  3.5 - 5.3 mmol/L Final  . Chloride 03/15/2016 103  98 - 110 mmol/L Final  . CO2 03/15/2016 25  20 - 31 mmol/L Final  . Glucose, Bld 03/15/2016 139* 65 - 99 mg/dL Final  . BUN 03/15/2016 29* 7 - 25 mg/dL Final  . Creat 03/15/2016 1.30* 0.60 - 0.88 mg/dL Final   Comment:   For patients > or = 81 years of age: The upper reference limit for Creatinine is approximately 13% higher for people identified as African-American.     . Total Bilirubin 03/15/2016 0.4  0.2 - 1.2 mg/dL Final  . Alkaline Phosphatase 03/15/2016 53  33 - 130 U/L Final  . AST 03/15/2016 26  10 - 35 U/L Final  . ALT 03/15/2016 20  6 - 29 U/L Final  . Total Protein 03/15/2016 7.1  6.1 - 8.1 g/dL Final  . Albumin 03/15/2016 4.5  3.6 - 5.1 g/dL Final  . Calcium 03/15/2016 9.5  8.6 - 10.4 mg/dL Final  . GFR, Est African American 03/15/2016 43* >=60 mL/min Final  . GFR, Est Non African American 03/15/2016 37* >=60 mL/min Final  . WBC 03/15/2016 6.4  3.8 - 10.8 K/uL Final  . RBC 03/15/2016 4.71  3.80 - 5.10 MIL/uL Final  . Hemoglobin 03/15/2016 13.6  11.7 - 15.5 g/dL Final  . HCT 03/15/2016 42.6  35.0 - 45.0 % Final  . MCV 03/15/2016 90.4  80.0 - 100.0  fL Final  . MCH 03/15/2016 28.9  27.0 - 33.0 pg Final  . MCHC 03/15/2016 31.9* 32.0 - 36.0 g/dL Final  . RDW 03/15/2016 14.0  11.0 - 15.0 % Final  . Platelets 03/15/2016 219  140 - 400 K/uL Final    . MPV 03/15/2016 9.0  7.5 - 12.5 fL Final  . Neutro Abs 03/15/2016 3200  1,500 - 7,800 cells/uL Final  . Lymphs Abs 03/15/2016 2368  850 - 3,900 cells/uL Final  . Monocytes Absolute 03/15/2016 640  200 - 950 cells/uL Final  . Eosinophils Absolute 03/15/2016 128  15 - 500 cells/uL Final  . Basophils Absolute 03/15/2016 64  0 - 200 cells/uL Final  . Neutrophils Relative % 03/15/2016 50  % Final  . Lymphocytes Relative 03/15/2016 37  % Final  . Monocytes Relative 03/15/2016 10  % Final  . Eosinophils Relative 03/15/2016 2  % Final  . Basophils Relative 03/15/2016 1  % Final  . Smear Review 03/15/2016 Criteria for review not met   Final  . TSH 03/15/2016 3.89  mIU/L Final   Comment:   Reference Range   > or = 20 Years  0.40-4.50   Pregnancy Range First trimester  0.26-2.66 Second trimester 0.55-2.73 Third trimester  0.43-2.91     . Free T4 03/15/2016 1.2  0.8 - 1.8 ng/dL Final  . Cholesterol 03/15/2016 188  <200 mg/dL Final  . Triglycerides 03/15/2016 225* <150 mg/dL Final  . HDL 03/15/2016 62  >50 mg/dL Final  . Total CHOL/HDL Ratio 03/15/2016 3.0  <5.0 Ratio Final  . VLDL 03/15/2016 45* <30 mg/dL Final  . LDL Cholesterol 03/15/2016 81  <100 mg/dL Final  . Hgb A1c MFr Bld 03/15/2016 5.7* <5.7 % Final   Comment:   For someone without known diabetes, a hemoglobin A1c value between 5.7% and 6.4% is consistent with prediabetes and should be confirmed with a follow-up test.   For someone with known diabetes, a value <7% indicates that their diabetes is well controlled. A1c targets should be individualized based on duration of diabetes, age, co-morbid conditions and other considerations.   This assay result is consistent with an increased risk of diabetes.   Currently, no consensus exists regarding use of hemoglobin A1c for diagnosis of diabetes in children.     . Mean Plasma Glucose 03/15/2016 117  mg/dL Final  . Color, Urine 03/15/2016 YELLOW  YELLOW Final  . APPearance  03/15/2016 CLEAR  CLEAR Final  . Specific Gravity, Urine 03/15/2016 1.023  1.001 - 1.035 Final  . pH 03/15/2016 5.0  5.0 - 8.0 Final  . Glucose, UA 03/15/2016 NEGATIVE  NEGATIVE Final  . Bilirubin Urine 03/15/2016 NEGATIVE  NEGATIVE Final  . Ketones, ur 03/15/2016 NEGATIVE  NEGATIVE Final  . Hgb urine dipstick 03/15/2016 NEGATIVE  NEGATIVE Final  . Protein, ur 03/15/2016 NEGATIVE  NEGATIVE Final  . Nitrite 03/15/2016 NEGATIVE  NEGATIVE Final  . Leukocytes, UA 03/15/2016 NEGATIVE  NEGATIVE Final    No results found.   Assessment/Plan   ICD-9-CM ICD-10-CM   1. Dementia without behavioral disturbance, unspecified dementia type 294.20 F03.90 Memantine HCl-Donepezil HCl (NAMZARIC) 28-10 MG CP24  2. Right calf pain 729.5 M79.661 UE VENOUS DUPLEX  3. Pain in joint involving multiple sites 719.49 M25.50 acetaminophen-codeine (TYLENOL #3) 300-30 MG tablet  4. High risk medication use V58.69 Z79.899   5. Chronic pain of right knee 719.46 M25.561    338.29 G89.29   6. PAD (peripheral artery disease) (HCC) 443.9 I73.9   7.  History of deep vein thrombosis (DVT) of lower extremity V12.51 Z86.718 UE VENOUS DUPLEX    Pt discouraged from driving due to memory loss  STOP YOUTHFUL MEDICINE supplement for memory  CONTINUE [facility] daily for memory  Will call with knee xray results and [place] appt  STOP MELOXICAM due to aspirin reaction of throat swelling  Continue all other medications as ordered  Follow up in 3 mos for memory loss    Charletha Dalpe S. Perlie Gold  [facility] [address] [place], [place] [address] [phone] Cell (Monday-Friday 8 AM - 5 PM) [phone] After 5 PM and follow prompts

## 2016-05-12 NOTE — Patient Instructions (Addendum)
STOP YOUTHFUL MEDICINE supplement for memory  CONTINUE [facility] daily for memory  Will call with knee xray results and [place] appt  STOP MELOXICAM due to aspirin reaction of throat swelling  Continue all other medications as ordered  Follow up in 3 mos for memory loss

## 2016-05-15 ENCOUNTER — Telehealth: Payer: Self-pay | Admitting: *Deleted

## 2016-05-15 NOTE — Telephone Encounter (Signed)
Addendum to OV note to correct Order to Lower Extremity.

## 2016-05-15 NOTE — Addendum Note (Signed)
Addended by: Rafael Bihari A on: 05/15/2016 12:48 PM   Modules accepted: Orders

## 2016-05-15 NOTE — Telephone Encounter (Signed)
Misty Patterson with CV Imaging at [facility] called and stated that Dr. Eulas Post ordered a LE Venous and the soonest they have for an appointment is 05/29/16 at 8:00am. I had her go ahead and schedule this. Is this ok, this is the soonest they have. Please Advise.

## 2016-05-15 NOTE — Telephone Encounter (Signed)
Please proceed as scheduled. Thanks

## 2016-05-15 NOTE — Telephone Encounter (Signed)
Misty Patterson with CV Imaging at [facility] called back and stated that since the Dx is Calf Pain the Order has to be edited for Lower Extremity instead of Upper.

## 2016-05-29 ENCOUNTER — Inpatient Hospital Stay (HOSPITAL_COMMUNITY)
Admission: RE | Admit: 2016-05-29 | Discharge: 2016-05-29 | Disposition: A | Discharge status: Home / Self Care | Payer: PPO | Source: Ambulatory Visit | Attending: Internal Medicine | Admitting: Internal Medicine

## 2016-05-29 DIAGNOSIS — M79661 Pain in right lower leg: Secondary | ICD-10-CM

## 2016-05-29 DIAGNOSIS — Z86718 Personal history of other venous thrombosis and embolism: Secondary | ICD-10-CM

## 2016-07-02 ENCOUNTER — Other Ambulatory Visit: Payer: Self-pay | Admitting: Family Medicine

## 2016-07-02 DIAGNOSIS — E039 Hypothyroidism, unspecified: Secondary | ICD-10-CM

## 2016-07-14 ENCOUNTER — Other Ambulatory Visit: Payer: Self-pay | Admitting: Neurology

## 2016-07-14 NOTE — Telephone Encounter (Signed)
Rx sent.  No refills until after next appointment.  

## 2016-07-18 ENCOUNTER — Telehealth: Payer: Self-pay | Admitting: Internal Medicine

## 2016-07-18 NOTE — Telephone Encounter (Signed)
I called the patient to schedule her AWV-I and follow up visit.  She mentioned possibly needing transportation so I provided her with the number to [employer] [phone]. VDM (DD)

## 2016-07-25 ENCOUNTER — Other Ambulatory Visit: Payer: Self-pay | Admitting: *Deleted

## 2016-07-25 DIAGNOSIS — M2550 Pain in unspecified joint: Secondary | ICD-10-CM

## 2016-07-25 MED ORDER — ACETAMINOPHEN-CODEINE #3 300-30 MG PO TABS: 1.0000 | ORAL_TABLET | Freq: Three times a day (TID) | ORAL | 0 refills | Status: DC | PRN

## 2016-07-25 NOTE — Telephone Encounter (Signed)
[employer] [address]

## 2016-07-29 ENCOUNTER — Other Ambulatory Visit: Payer: Self-pay | Admitting: Family Medicine

## 2016-09-07 ENCOUNTER — Ambulatory Visit: Payer: PPO | Admitting: Vascular Surgery

## 2016-09-07 ENCOUNTER — Encounter (HOSPITAL_COMMUNITY): Payer: PPO

## 2016-09-07 ENCOUNTER — Other Ambulatory Visit (HOSPITAL_COMMUNITY): Payer: PPO

## 2016-09-08 ENCOUNTER — Ambulatory Visit (INDEPENDENT_AMBULATORY_CARE_PROVIDER_SITE_OTHER): Payer: PPO | Admitting: Internal Medicine

## 2016-09-08 ENCOUNTER — Encounter: Payer: Self-pay | Admitting: Internal Medicine

## 2016-09-08 ENCOUNTER — Ambulatory Visit (INDEPENDENT_AMBULATORY_CARE_PROVIDER_SITE_OTHER): Payer: PPO

## 2016-09-08 VITALS — BP 140/64 | HR 78 | Temp 98.0°F | Ht 65.0 in | Wt 155.0 lb

## 2016-09-08 VITALS — BP 140/64 | HR 78 | Temp 98.3°F | Ht 65.0 in | Wt 155.0 lb

## 2016-09-08 DIAGNOSIS — N3281 Overactive bladder: Secondary | ICD-10-CM | POA: Diagnosis not present

## 2016-09-08 DIAGNOSIS — M2550 Pain in unspecified joint: Secondary | ICD-10-CM

## 2016-09-08 DIAGNOSIS — R7303 Prediabetes: Secondary | ICD-10-CM

## 2016-09-08 DIAGNOSIS — F329 Major depressive disorder, single episode, unspecified: Secondary | ICD-10-CM | POA: Diagnosis not present

## 2016-09-08 DIAGNOSIS — Z79899 Other long term (current) drug therapy: Secondary | ICD-10-CM | POA: Diagnosis not present

## 2016-09-08 DIAGNOSIS — Z0000 Encounter for general adult medical examination without abnormal findings: Secondary | ICD-10-CM

## 2016-09-08 DIAGNOSIS — F0390 Unspecified dementia without behavioral disturbance: Secondary | ICD-10-CM | POA: Diagnosis not present

## 2016-09-08 DIAGNOSIS — I739 Peripheral vascular disease, unspecified: Secondary | ICD-10-CM

## 2016-09-08 DIAGNOSIS — G4700 Insomnia, unspecified: Secondary | ICD-10-CM | POA: Diagnosis not present

## 2016-09-08 DIAGNOSIS — F339 Major depressive disorder, recurrent, unspecified: Secondary | ICD-10-CM | POA: Diagnosis not present

## 2016-09-08 DIAGNOSIS — E039 Hypothyroidism, unspecified: Secondary | ICD-10-CM | POA: Diagnosis not present

## 2016-09-08 DIAGNOSIS — E782 Mixed hyperlipidemia: Secondary | ICD-10-CM | POA: Diagnosis not present

## 2016-09-08 MED ORDER — MEMANTINE HCL-DONEPEZIL HCL ER 28-10 MG PO CP24: 1.0000 | ORAL_CAPSULE | Freq: Every day | ORAL | 6 refills | Status: DC

## 2016-09-08 MED ORDER — TRAZODONE HCL 100 MG PO TABS: 100.0000 mg | ORAL_TABLET | Freq: Every day | ORAL | 3 refills | Status: DC

## 2016-09-08 MED ORDER — LEVOTHYROXINE SODIUM 25 MCG PO TABS: 25.0000 ug | ORAL_TABLET | Freq: Every day | ORAL | 3 refills | Status: DC

## 2016-09-08 NOTE — Patient Instructions (Signed)
Misty Patterson , Thank you for taking time to come for your Medicare Wellness Visit. I appreciate your ongoing commitment to your health goals. Please review the following plan we discussed and let me know if I can assist you in the future.   Screening recommendations/referrals: Colonoscopy up to date, pt over age 81 Mammogram up to date, pt over age  Bone Density up to date Recommended yearly ophthalmology/optometry visit for glaucoma screening and checkup Recommended yearly dental visit for hygiene and checkup  Vaccinations: Influenza vaccine up to date. Due 12/09/16 Pneumococcal vaccine up to date Tdap vaccine up to date. Due 02/09/25 Shingles vaccine due, if you decide you want this vaccine let us know and we will send prescription to your pharmacy  Advanced directives: Need a copy for chart  Conditions/risks identified: None  Next appointment: Dr. Eulas Post 09/08/16 @11 :30am   Preventive Care 65 Years and Older, Female Preventive care refers to lifestyle choices and visits with your health care provider that can promote health and wellness. What does preventive care include?  A yearly physical exam. This is also called an annual well check.  Dental exams once or twice a year.  Routine eye exams. Ask your health care provider how often you should have your eyes checked.  Personal lifestyle choices, including:  Daily care of your teeth and gums.  Regular physical activity.  Eating a healthy diet.  Avoiding tobacco and drug use.  Limiting alcohol use.  Practicing safe sex.  Taking low-dose aspirin every day.  Taking vitamin and mineral supplements as recommended by your health care provider. What happens during an annual well check? The services and screenings done by your health care provider during your annual well check will depend on your age, overall health, lifestyle risk factors, and family history of disease. Counseling  Your health care provider may ask you  questions about your:  Alcohol use.  Tobacco use.  Drug use.  Emotional well-being.  Home and relationship well-being.  Sexual activity.  Eating habits.  History of falls.  Memory and ability to understand (cognition).  Work and work Statistician.  Reproductive health. Screening  You may have the following tests or measurements:  Height, weight, and BMI.  Blood pressure.  Lipid and cholesterol levels. These may be checked every 5 years, or more frequently if you are over 72 years old.  Skin check.  Lung cancer screening. You may have this screening every year starting at age 42 if you have a 30-pack-year history of smoking and currently smoke or have quit within the past 15 years.  Fecal occult blood test (FOBT) of the stool. You may have this test every year starting at age 14.  Flexible sigmoidoscopy or colonoscopy. You may have a sigmoidoscopy every 5 years or a colonoscopy every 10 years starting at age 30.  Hepatitis C blood test.  Hepatitis B blood test.  Sexually transmitted disease (STD) testing.  Diabetes screening. This is done by checking your blood sugar (glucose) after you have not eaten for a while (fasting). You may have this done every 1-3 years.  Bone density scan. This is done to screen for osteoporosis. You may have this done starting at age 62.  Mammogram. This may be done every 1-2 years. Talk to your health care provider about how often you should have regular mammograms. Talk with your health care provider about your test results, treatment options, and if necessary, the need for more tests. Vaccines  Your health care provider may recommend  certain vaccines, such as:  Influenza vaccine. This is recommended every year.  Tetanus, diphtheria, and acellular pertussis (Tdap, Td) vaccine. You may need a Td booster every 10 years.  Zoster vaccine. You may need this after age 84.  Pneumococcal 13-valent conjugate (PCV13) vaccine. One dose is  recommended after age 109.  Pneumococcal polysaccharide (PPSV23) vaccine. One dose is recommended after age 11. Talk to your health care provider about which screenings and vaccines you need and how often you need them. This information is not intended to replace advice given to you by your health care provider. Make sure you discuss any questions you have with your health care provider. Document Released: 03/26/2015 Document Revised: 11/17/2015 Document Reviewed: 12/29/2014 Elsevier Interactive Patient Education  2017 [place] Prevention in the Home Falls can cause injuries. They can happen to people of all ages. There are many things you can do to make your home safe and to help prevent falls. What can I do on the outside of my home?  Regularly fix the edges of walkways and driveways and fix any cracks.  Remove anything that might make you trip as you walk through a door, such as a raised step or threshold.  Trim any bushes or trees on the path to your home.  Use bright outdoor lighting.  Clear any walking paths of anything that might make someone trip, such as rocks or tools.  Regularly check to see if handrails are loose or broken. Make sure that both sides of any steps have handrails.  Any raised decks and porches should have guardrails on the edges.  Have any leaves, snow, or ice cleared regularly.  Use sand or salt on walking paths during winter.  Clean up any spills in your garage right away. This includes oil or grease spills. What can I do in the bathroom?  Use night lights.  Install grab bars by the toilet and in the tub and shower. Do not use towel bars as grab bars.  Use non-skid mats or decals in the tub or shower.  If you need to sit down in the shower, use a plastic, non-slip stool.  Keep the floor dry. Clean up any water that spills on the floor as soon as it happens.  Remove soap buildup in the tub or shower regularly.  Attach bath mats  securely with double-sided non-slip rug tape.  Do not have throw rugs and other things on the floor that can make you trip. What can I do in the bedroom?  Use night lights.  Make sure that you have a light by your bed that is easy to reach.  Do not use any sheets or blankets that are too big for your bed. They should not hang down onto the floor.  Have a firm chair that has side arms. You can use this for support while you get dressed.  Do not have throw rugs and other things on the floor that can make you trip. What can I do in the kitchen?  Clean up any spills right away.  Avoid walking on wet floors.  Keep items that you use a lot in easy-to-reach places.  If you need to reach something above you, use a strong step stool that has a grab bar.  Keep electrical cords out of the way.  Do not use floor polish or wax that makes floors slippery. If you must use wax, use non-skid floor wax.  Do not have throw rugs and other  things on the floor that can make you trip. What can I do with my stairs?  Do not leave any items on the stairs.  Make sure that there are handrails on both sides of the stairs and use them. Fix handrails that are broken or loose. Make sure that handrails are as long as the stairways.  Check any carpeting to make sure that it is firmly attached to the stairs. Fix any carpet that is loose or worn.  Avoid having throw rugs at the top or bottom of the stairs. If you do have throw rugs, attach them to the floor with carpet tape.  Make sure that you have a light switch at the top of the stairs and the bottom of the stairs. If you do not have them, ask someone to add them for you. What else can I do to help prevent falls?  Wear shoes that:  Do not have high heels.  Have rubber bottoms.  Are comfortable and fit you well.  Are closed at the toe. Do not wear sandals.  If you use a stepladder:  Make sure that it is fully opened. Do not climb a closed  stepladder.  Make sure that both sides of the stepladder are locked into place.  Ask someone to hold it for you, if possible.  Clearly mark and make sure that you can see:  Any grab bars or handrails.  First and last steps.  Where the edge of each step is.  Use tools that help you move around (mobility aids) if they are needed. These include:  Canes.  Walkers.  Scooters.  Crutches.  Turn on the lights when you go into a dark area. Replace any light bulbs as soon as they burn out.  Set up your furniture so you have a clear path. Avoid moving your furniture around.  If any of your floors are uneven, fix them.  If there are any pets around you, be aware of where they are.  Review your medicines with your doctor. Some medicines can make you feel dizzy. This can increase your chance of falling. Ask your doctor what other things that you can do to help prevent falls. This information is not intended to replace advice given to you by your health care provider. Make sure you discuss any questions you have with your health care provider. Document Released: 12/24/2008 Document Revised: 08/05/2015 Document Reviewed: 04/03/2014 Elsevier Interactive Patient Education  2017 [employer].

## 2016-09-08 NOTE — Progress Notes (Signed)
Patient ID: Misty Patterson, female   DOB: Mar 08, 1927, 83 y.o.   MRN: [id number]    Location:  [facility] Place of Service: OFFICE  Chief Complaint  Patient presents with  . Follow-up    Follow up on memory  . MMSE    25/30 did not pass clock drawing    HPI:  83 yo female seen today for f/u. She reports feeling well overall. She no longer drives. She has insomnia and takes trazodone 60m qhs and 344mmelatonin. She wakes up at 130AM and unable to fall back to sleep.   Her son did not accompany her to her appt today. Neighbor is present instead. She is a poor historian due to dementia. Hx obtained from chart  [facility]. Since fall, she has paresthesias in hands and pain in RLE and base of neck. She has had neck and back sx by Dr DuLynann Bolognan the past. She saw Dr DuSharol Givenn Nov 2017 for trapezius spasms. No injections given at that time  Anxiety - She takes valium qhs prn  She reports increased SOB since [place]  Chronic back/neck pain - due to cervical spinal stenosis, peripheral neuropathy, radiculopathy and RLE. Followed by Ortho Dr DuSharol Givennd Dr DuLynann BolognaDementia - improved on namzaric without ADRs. After reviewing chart, she is also taking 1041mricept. MMSE 25/30 (prev 18/30) in Jun 2018. Weight up 7 lbs  Prediabetes - A1c 5.7%  Hx ovarian CA - tx >30 yrs ago but does not recall exact tx. In remission  PAD - has RLE pain. Takes plavix. She is s/p right above-knee popliteal artery stenting in 2015. In June 2017, ABI on right 0.97; left 0.91. Followed by vascular sx Dr FieOneida Alarypothyroidism - dx in Oct 2017 by Dr StaNolon Rodhe was started on synthroid 26m53maily but does not recall repeat labs and unsure if she is taking medication now; free T4 level 1.2; TSH 3.89  MDD/insomnia - stable on sertraline. She takes trazodone to help sleep along with melatonin  OAB - stable on vesicare  Hyperlipidemia - diet controlled.  LDL 81   Past Medical History:  Diagnosis Date  . Anxiety    takes Valium daily  . Cancer (HCC)[place] ovarian  . Cervical disc disease   . Chronic back pain   . Chronic kidney disease   . COPD (chronic obstructive pulmonary disease) (HCC)[place] ProAir daily as needed  . CTS (carpal tunnel syndrome)   . Decreased GFR   . Dementia    takes aricept  . Depression    takes Zoloft daily  . DJD (degenerative joint disease) of cervical spine   . DVT (deep venous thrombosis) (HCC)    right leg  . Dyslipidemia    but doesn't take any meds   . Esophageal dysmotility   . GERD (gastroesophageal reflux disease)    takes pepcid daily  . Hypertension    takes Amlodipine and Atenolol daily  . Incontinence of urine   . Insomnia    takes Trazodone nightly  . Neuropathy   . Nocturia   . Osteoporosis    takes Vit D daily  . Ovarian cancer (HCC)[place]. Peripheral neuropathy   . Peripheral vascular disease (HCC)[place]. Pneumonia    hx of-15+yrs ago  . Radiculopathy   . Restless leg syndrome   . Urinary frequency   .  Zenker's diverticulum    small    Past Surgical History:  Procedure Laterality Date  . ABDOMINAL AORTAGRAM  Feb. 20, 2015  . ABDOMINAL AORTAGRAM N/A 05/02/2013   Procedure: ABDOMINAL Maxcine Ham;  Surgeon: Elam Dutch, MD;  Location: [facility] CATH LAB;  Service: Cardiovascular;  Laterality: N/A;  . ABDOMINAL HYSTERECTOMY    . BACK SURGERY    . cataract surgery Bilateral   . COLONOSCOPY    . POSTERIOR CERVICAL FUSION/FORAMINOTOMY N/A 01/28/2015   Procedure: POSTERIOR CERVICAL DECOMPRESSION FUSION LEVEL 5 WITH INSTRUMENTATION AND ALLOGRAFT;  Surgeon: Phylliss Bob, MD;  Location: [facility];  Service: Orthopedics;  Laterality: N/A;  Cervical posterior decompression fusion, cervical 3-4, cervical 4-5, cervical 5-6, cervical 6-7, cervical 6-thoracic 1 with instrumentation and allograft.  . TONSILLECTOMY    . [place]    Patient Care Team: Gildardo Cranker, DO as PCP - General  (Internal Medicine) Vern Claude, [place] as [place] History  . Marital status: Widowed    Spouse name: N/A  . Number of children: 3  . Years of education: N/A   Occupational History  .  Retired   Social History Main Topics  . Smoking status: Never Smoker  . Smokeless tobacco: Never Used  . Alcohol use No  . Drug use: No  . Sexual activity: No   Other Topics Concern  . Not on file   Social History Narrative   Diet? no      Do you drink/eat things with caffeine?  Coffee, 1 cup      Marital status?    widow                                What year were you married? 17      Do you live in a house, apartment, assisted living, condo, trailer, etc.? House      Is it one or more stories? 1      How many persons live in your home?       Do you have any pets in your home? (please list)  1      Current or past profession: hair dresser      Do you exercise?                yes                      Type & how often? Walk now in house      Do you have a living will? yes      Do you have a DNR form?                                  If not, do you want to discuss one?      Do you have signed POA/HPOA for forms?         reports that she has never smoked. She has never used smokeless tobacco. She reports that she does not drink alcohol or use drugs.  Family History  Problem Relation Age of Onset  . Hypertension Mother   . Stroke Mother   . Lung cancer Son        Deceased, 60  . Other Father        MVA  . Hypertension Brother   .  Stroke Brother   . Kidney disease Sister   . Stroke Brother   . Diabetes Brother        DM  . Stroke Brother   . Cancer Sister        throat  . Healthy Son    Family Status  Relation Status  . Mother Deceased at age 43  . Son Deceased at age 73  . Father Deceased at age 41  . Brother Deceased  . Sister Deceased  . Brother Deceased  . Brother Deceased  . Sister Deceased    . Sister Deceased  . Brother Alive  . Son (Not Specified)     Allergies  Allergen Reactions  . Aspirin Other (See Comments)    REACTION:  Choking sensation, possible throat swelling  . Formaldehyde Swelling    Tongue swelling  . Sodium Pentobarbital [Pentobarbital] Other (See Comments)    Memory Loss    Medications: Patient's Medications  New Prescriptions   No medications on file  Previous Medications   ACETAMINOPHEN-CODEINE (TYLENOL #3) 300-30 MG TABLET    Take 1 tablet by mouth every 8 (eight) hours as needed for moderate pain.   ACETAMINOPHEN-CODEINE 300-30 MG TABLET    Take 1 tablet by mouth 2 (two) times daily. Reported on 07/29/2015   AMLODIPINE (NORVASC) 5 MG TABLET    Take 5 mg by mouth 2 (two) times daily.    B COMPLEX-C (B-COMPLEX WITH VITAMIN C) TABLET    Take 1 tablet by mouth daily. Reported on 05/21/2015   CHOLECALCIFEROL (VITAMIN D) 1000 UNITS TABLET    Take 1,000 Units by mouth daily.   CLOPIDOGREL (PLAVIX) 75 MG TABLET    Take 75 mg by mouth daily.   CYCLOBENZAPRINE (FLEXERIL) 5 MG TABLET    Take 5 mg by mouth every 8 (eight) hours as needed for muscle spasms.   DIAZEPAM (VALIUM) 5 MG TABLET    Take 1 tablet (5 mg total) by mouth at bedtime.   DONEPEZIL (ARICEPT) 10 MG TABLET    Take 1 tablet by mouth daily.   GABAPENTIN (NEURONTIN) 600 MG TABLET    Take 600 mg by mouth at bedtime.   LEVOTHYROXINE (SYNTHROID, LEVOTHROID) 25 MCG TABLET    TAKE 1 TABLET(25 MCG) BY MOUTH DAILY BEFORE BREAKFAST   METHOCARBAMOL (ROBAXIN) 500 MG TABLET    Take 500 mg by mouth 2 (two) times daily as needed for muscle spasms.    MISC NATURAL PRODUCTS (GLUCOSAMINE CHOND COMPLEX/MSM PO)    Take 1 tablet by mouth daily.   MISC NATURAL PRODUCTS (SINUS FORMULA) TABS    Take 1 tablet by mouth daily as needed.   MULTIPLE VITAMIN (MULTIVITAMIN WITH MINERALS) TABS TABLET    Take 1 tablet by mouth daily.   NUTRITIONAL SUPPLEMENTS (NUTRITIONAL SUPPLEMENT PO)    Take 1 capsule by mouth 2 (two) times  daily. Lipogen PS Plus   OMEGA 3 1000 MG CAPS    Take 1,000 mg by mouth daily.   PROVENTIL HFA 108 (90 BASE) MCG/ACT INHALER    INHALE 2 PUFFS INTO THE LUNGS EVERY 4 HOURS AS NEEDED FOR WHEEZING OR SHORTNESS OF BREATH   SERTRALINE (ZOLOFT) 50 MG TABLET    Take 50 mg by mouth at bedtime.   SODIUM CHLORIDE (OCEAN) 0.65 % SOLN NASAL SPRAY    Place 1 spray into both nostrils at bedtime as needed for congestion. Reported on 07/16/2015   SOLIFENACIN (VESICARE) 5 MG TABLET    Take 5 mg by mouth  daily.   TRAZODONE (DESYREL) 50 MG TABLET    Take 50 mg by mouth at bedtime.   UNABLE TO FIND    Med Name: Curell (supplement) Take 1 by mouth daily.  Modified Medications   Modified Medication Previous Medication   MEMANTINE HCL-DONEPEZIL HCL (NAMZARIC) 28-10 MG CP24 Memantine HCl-Donepezil HCl (NAMZARIC) 28-10 MG CP24      Take 1 capsule by mouth daily.    Take 1 capsule by mouth daily.  Discontinued Medications   No medications on file    Review of Systems  Unable to perform ROS: Dementia    Vitals:   09/08/16 1132  BP: 140/64  Pulse: 78  Temp: 98 F (36.7 C)  TempSrc: Oral  SpO2: 98%  Weight: 155 lb (70.3 kg)  Height: '5\' 5"'  (1.651 m)   Body mass index is 25.79 kg/m.  Physical Exam  Constitutional: She appears well-developed and well-nourished.  HENT:  Mouth/Throat: Oropharynx is clear and moist. No oropharyngeal exudate.  Eyes: Pupils are equal, round, and reactive to light. No scleral icterus.  Neck: Neck supple. Muscular tenderness present. Carotid bruit is not present. Decreased range of motion present. No tracheal deviation present. No thyromegaly present.    Cardiovascular: Normal rate, regular rhythm and intact distal pulses.  Exam reveals no gallop and no friction rub.   Murmur (1/6 SEM) heard. No LE edema b/l. no calf TTP.   Pulmonary/Chest: Effort normal and breath sounds normal. No stridor. No respiratory distress. She has no wheezes. She has no rales.  Reduced BS at base b/l   Abdominal: Soft. Bowel sounds are normal. She exhibits no distension and no mass. There is no hepatomegaly. There is no tenderness. There is no rebound and no guarding.  Musculoskeletal: She exhibits edema (right knee with reduced ROM), tenderness and deformity (mild thoracic kyphosis).  Lymphadenopathy:    She has no cervical adenopathy.  Neurological: She is alert.  Skin: Skin is warm and dry. No rash noted.  Psychiatric: Her behavior is normal. Thought content normal. Her mood appears anxious. Cognition and memory are impaired.     Labs reviewed: No visits with results within 3 Month(s) from this visit.  Latest known visit with results is:  Office Visit on 03/15/2016  Component Date Value Ref Range Status  . Sodium 03/15/2016 142  135 - 146 mmol/L Final  . Potassium 03/15/2016 4.0  3.5 - 5.3 mmol/L Final  . Chloride 03/15/2016 103  98 - 110 mmol/L Final  . CO2 03/15/2016 25  20 - 31 mmol/L Final  . Glucose, Bld 03/15/2016 139* 65 - 99 mg/dL Final  . BUN 03/15/2016 29* 7 - 25 mg/dL Final  . Creat 03/15/2016 1.30* 0.60 - 0.88 mg/dL Final   Comment:   For patients > or = 81 years of age: The upper reference limit for Creatinine is approximately 13% higher for people identified as African-American.     . Total Bilirubin 03/15/2016 0.4  0.2 - 1.2 mg/dL Final  . Alkaline Phosphatase 03/15/2016 53  33 - 130 U/L Final  . AST 03/15/2016 26  10 - 35 U/L Final  . ALT 03/15/2016 20  6 - 29 U/L Final  . Total Protein 03/15/2016 7.1  6.1 - 8.1 g/dL Final  . Albumin 03/15/2016 4.5  3.6 - 5.1 g/dL Final  . Calcium 03/15/2016 9.5  8.6 - 10.4 mg/dL Final  . GFR, Est African American 03/15/2016 43* >=60 mL/min Final  . GFR, Est Non African American 03/15/2016 37* >=60  mL/min Final  . WBC 03/15/2016 6.4  3.8 - 10.8 K/uL Final  . RBC 03/15/2016 4.71  3.80 - 5.10 MIL/uL Final  . Hemoglobin 03/15/2016 13.6  11.7 - 15.5 g/dL Final  . HCT 03/15/2016 42.6  35.0 - 45.0 % Final  . MCV 03/15/2016  90.4  80.0 - 100.0 fL Final  . MCH 03/15/2016 28.9  27.0 - 33.0 pg Final  . MCHC 03/15/2016 31.9* 32.0 - 36.0 g/dL Final  . RDW 03/15/2016 14.0  11.0 - 15.0 % Final  . Platelets 03/15/2016 219  140 - 400 K/uL Final  . MPV 03/15/2016 9.0  7.5 - 12.5 fL Final  . Neutro Abs 03/15/2016 3200  1,500 - 7,800 cells/uL Final  . Lymphs Abs 03/15/2016 2368  850 - 3,900 cells/uL Final  . Monocytes Absolute 03/15/2016 640  200 - 950 cells/uL Final  . Eosinophils Absolute 03/15/2016 128  15 - 500 cells/uL Final  . Basophils Absolute 03/15/2016 64  0 - 200 cells/uL Final  . Neutrophils Relative % 03/15/2016 50  % Final  . Lymphocytes Relative 03/15/2016 37  % Final  . Monocytes Relative 03/15/2016 10  % Final  . Eosinophils Relative 03/15/2016 2  % Final  . Basophils Relative 03/15/2016 1  % Final  . Smear Review 03/15/2016 Criteria for review not met   Final  . TSH 03/15/2016 3.89  mIU/L Final   Comment:   Reference Range   > or = 20 Years  0.40-4.50   Pregnancy Range First trimester  0.26-2.66 Second trimester 0.55-2.73 Third trimester  0.43-2.91     . Free T4 03/15/2016 1.2  0.8 - 1.8 ng/dL Final  . Cholesterol 03/15/2016 188  <200 mg/dL Final  . Triglycerides 03/15/2016 225* <150 mg/dL Final  . HDL 03/15/2016 62  >50 mg/dL Final  . Total CHOL/HDL Ratio 03/15/2016 3.0  <5.0 Ratio Final  . VLDL 03/15/2016 45* <30 mg/dL Final  . LDL Cholesterol 03/15/2016 81  <100 mg/dL Final  . Hgb A1c MFr Bld 03/15/2016 5.7* <5.7 % Final   Comment:   For someone without known diabetes, a hemoglobin A1c value between 5.7% and 6.4% is consistent with prediabetes and should be confirmed with a follow-up test.   For someone with known diabetes, a value <7% indicates that their diabetes is well controlled. A1c targets should be individualized based on duration of diabetes, age, co-morbid conditions and other considerations.   This assay result is consistent with an increased risk of diabetes.     Currently, no consensus exists regarding use of hemoglobin A1c for diagnosis of diabetes in children.     . Mean Plasma Glucose 03/15/2016 117  mg/dL Final  . Color, Urine 03/15/2016 YELLOW  YELLOW Final  . APPearance 03/15/2016 CLEAR  CLEAR Final  . Specific Gravity, Urine 03/15/2016 1.023  1.001 - 1.035 Final  . pH 03/15/2016 5.0  5.0 - 8.0 Final  . Glucose, UA 03/15/2016 NEGATIVE  NEGATIVE Final  . Bilirubin Urine 03/15/2016 NEGATIVE  NEGATIVE Final  . Ketones, ur 03/15/2016 NEGATIVE  NEGATIVE Final  . Hgb urine dipstick 03/15/2016 NEGATIVE  NEGATIVE Final  . Protein, ur 03/15/2016 NEGATIVE  NEGATIVE Final  . Nitrite 03/15/2016 NEGATIVE  NEGATIVE Final  . Leukocytes, UA 03/15/2016 NEGATIVE  NEGATIVE Final    No results found.   Assessment/Plan   ICD-10-CM   1. Acquired hypothyroidism E03.9 levothyroxine (SYNTHROID, LEVOTHROID) 25 MCG tablet    TSH  2. Dementia without behavioral disturbance, unspecified dementia type F03.90 Memantine  HCl-Donepezil HCl (NAMZARIC) 28-10 MG CP24  3. Pain in joint involving multiple sites M25.50   4. PAD (peripheral artery disease) (HCC) I73.9   5. Recurrent major depressive disorder, remission status unspecified (HCC) F33.9   6. OAB (overactive bladder) N32.81   7. Prediabetes R73.03   8. Insomnia, unspecified type G47.00 traZODone (DESYREL) 100 MG tablet  9. Major depressive disorder with single episode, remission status unspecified F32.9 BMP with eGFR    ALT  10. High risk medication use Z79.899   11. Mixed hyperlipidemia E78.2 Lipid Panel   Increase trazodone to 178m at bedtime to help sleep  Continue melatonin 1 tablet 344mat bedtime  Continue all other medications as ordered  Follow up with specialists as scheduled  Follow up in 3 mos for memory, insomnia and PAD   Shanikqua Zarzycki S. CaPerlie GoldPiGreenwood Regional Rehabilitation Hospitalnd Adult Medicine [address]rK-[place] Ranch[place] [id number] (Monday-Friday 8  AM - 5 PM) ([phone] 5 PM and follow prompts

## 2016-09-08 NOTE — Progress Notes (Signed)
Subjective:   Misty Patterson is a 83 y.o. female who presents for Medicare Annual (Subsequent) preventive examination.     Objective:     Vitals: BP 140/64 (BP Location: Left Arm, Patient Position: Sitting)   Pulse 78   Temp 98.3 F (36.8 C) (Oral)   Ht 5\' 5"  (1.651 m)   Wt 155 lb (70.3 kg)   SpO2 98%   BMI 25.79 kg/m   Body mass index is 25.79 kg/m.   Tobacco History  Smoking Status  . Never Smoker  Smokeless Tobacco  . Never Used     Counseling given: Not Answered   Past Medical History:  Diagnosis Date  . Anxiety    takes Valium daily  . Cancer ([place])    ovarian  . Cervical disc disease   . Chronic back pain   . Chronic kidney disease   . COPD (chronic obstructive pulmonary disease) ([place])    ProAir daily as needed  . CTS (carpal tunnel syndrome)   . Decreased GFR   . Dementia    takes aricept  . Depression    takes Zoloft daily  . DJD (degenerative joint disease) of cervical spine   . DVT (deep venous thrombosis) ([facility])    right leg  . Dyslipidemia    but doesn't take any meds   . Esophageal dysmotility   . GERD (gastroesophageal reflux disease)    takes pepcid daily  . Hypertension    takes Amlodipine and Atenolol daily  . Incontinence of urine   . Insomnia    takes Trazodone nightly  . Neuropathy   . Nocturia   . Osteoporosis    takes Vit D daily  . Ovarian cancer ([place])   . Peripheral neuropathy   . Peripheral vascular disease ([place])   . Pneumonia    hx of-15+yrs ago  . Radiculopathy   . Restless leg syndrome   . Urinary frequency   . Zenker's diverticulum    small   Past Surgical History:  Procedure Laterality Date  . ABDOMINAL AORTAGRAM  Feb. 20, 2015  . ABDOMINAL AORTAGRAM N/A 05/02/2013   Procedure: ABDOMINAL Maxcine Ham;  Surgeon: Elam Dutch, MD;  Location: [facility] CATH LAB;  Service: Cardiovascular;  Laterality: N/A;  . ABDOMINAL HYSTERECTOMY    . BACK SURGERY    . cataract surgery Bilateral   . COLONOSCOPY    . POSTERIOR  CERVICAL FUSION/FORAMINOTOMY N/A 01/28/2015   Procedure: POSTERIOR CERVICAL DECOMPRESSION FUSION LEVEL 5 WITH INSTRUMENTATION AND ALLOGRAFT;  Surgeon: Phylliss Bob, MD;  Location: [facility];  Service: Orthopedics;  Laterality: N/A;  Cervical posterior decompression fusion, cervical 3-4, cervical 4-5, cervical 5-6, cervical 6-7, cervical 6-thoracic 1 with instrumentation and allograft.  . TONSILLECTOMY    . VEIN SURGERY  1950   Family History  Problem Relation Age of Onset  . Hypertension Mother   . Stroke Mother   . Lung cancer Son        Deceased, 68  . Other Father        MVA  . Hypertension Brother   . Stroke Brother   . Kidney disease Sister   . Stroke Brother   . Diabetes Brother        DM  . Stroke Brother   . Cancer Sister        throat  . Healthy Son    History  Sexual Activity  . Sexual activity: No    Outpatient Encounter Prescriptions as of 09/08/2016  Medication Sig  .  acetaminophen-codeine (TYLENOL #3) 300-30 MG tablet Take 1 tablet by mouth every 8 (eight) hours as needed for moderate pain.  . Acetaminophen-Codeine 300-30 MG tablet Take 1 tablet by mouth 2 (two) times daily. Reported on 07/29/2015  . amLODipine (NORVASC) 5 MG tablet Take 5 mg by mouth 2 (two) times daily.   . B Complex-C (B-COMPLEX WITH VITAMIN C) tablet Take 1 tablet by mouth daily. Reported on 05/21/2015  . cholecalciferol (VITAMIN D) 1000 units tablet Take 1,000 Units by mouth daily.  . clopidogrel (PLAVIX) 75 MG tablet Take 75 mg by mouth daily.  . cyclobenzaprine (FLEXERIL) 5 MG tablet Take 5 mg by mouth every 8 (eight) hours as needed for muscle spasms.  . diazepam (VALIUM) 5 MG tablet Take 1 tablet (5 mg total) by mouth at bedtime.  . donepezil (ARICEPT) 10 MG tablet Take 1 tablet by mouth daily.  Marland Kitchen gabapentin (NEURONTIN) 600 MG tablet Take 600 mg by mouth at bedtime.  Marland Kitchen levothyroxine (SYNTHROID, LEVOTHROID) 25 MCG tablet TAKE 1 TABLET(25 MCG) BY MOUTH DAILY BEFORE BREAKFAST  . Memantine  HCl-Donepezil HCl (NAMZARIC) 28-10 MG CP24 Take 1 capsule by mouth daily.  . methocarbamol (ROBAXIN) 500 MG tablet Take 500 mg by mouth 2 (two) times daily as needed for muscle spasms.   . Misc Natural Products (GLUCOSAMINE CHOND COMPLEX/MSM PO) Take 1 tablet by mouth daily.  . Misc Natural Products (SINUS FORMULA) TABS Take 1 tablet by mouth daily as needed.  . Multiple Vitamin (MULTIVITAMIN WITH MINERALS) TABS tablet Take 1 tablet by mouth daily.  . Nutritional Supplements (NUTRITIONAL SUPPLEMENT PO) Take 1 capsule by mouth 2 (two) times daily. Lipogen PS Plus  . Omega 3 1000 MG CAPS Take 1,000 mg by mouth daily.  Marland Kitchen PROVENTIL HFA 108 (90 Base) MCG/ACT inhaler INHALE 2 PUFFS INTO THE LUNGS EVERY 4 HOURS AS NEEDED FOR WHEEZING OR SHORTNESS OF BREATH  . sertraline (ZOLOFT) 50 MG tablet Take 50 mg by mouth at bedtime.  . sodium chloride (OCEAN) 0.65 % SOLN nasal spray Place 1 spray into both nostrils at bedtime as needed for congestion. Reported on 07/16/2015  . solifenacin (VESICARE) 5 MG tablet Take 5 mg by mouth daily.  . traZODone (DESYREL) 50 MG tablet Take 50 mg by mouth at bedtime.  Marland Kitchen UNABLE TO FIND Med Name: Curell (supplement) Take 1 by mouth daily.  . [DISCONTINUED] Memantine HCl-Donepezil HCl (NAMZARIC) 28-10 MG CP24 Take 1 capsule by mouth daily.   No facility-administered encounter medications on file as of 09/08/2016.     Activities of Daily Living In your present state of health, do you have any difficulty performing the following activities: 09/08/2016  Hearing? N  Vision? N  Difficulty concentrating or making decisions? Y  Walking or climbing stairs? Y  Dressing or bathing? N  Doing errands, shopping? Y  Preparing Food and eating ? Y  Using the Toilet? Y  In the past six months, have you accidently leaked urine? Y  Do you have problems with loss of bowel control? N  Managing your Medications? Y  Managing your Finances? Y  Housekeeping or managing your Housekeeping? Y  Some  recent data might be hidden    Patient Care Team: Gildardo Cranker, DO as PCP - General (Internal Medicine) Vern Claude, [place] as [facility]    Assessment:    Exercise Activities and Dietary recommendations Current Exercise Habits: Home exercise routine, Type of exercise: walking, Time (Minutes): 20, Frequency (Times/Week): 7, Weekly Exercise (Minutes/Week):  140, Intensity: Mild, Exercise limited by: orthopedic condition(s);respiratory conditions(s)  Goals    . Hobby and resting          Patient will start sewing again and resting/being still 10-15 minutes a day.      Fall Risk Fall Risk  09/08/2016 05/12/2016 03/15/2016 12/28/2015 12/14/2015  Falls in the past year? No Yes Yes No No  Number falls in past yr: - 2 or more 2 or more - -  Injury with Fall? - Yes Yes - -  Risk for fall due to : - - - - -  Risk for fall due to (comments): - - - - -  Follow up - - - - -   Depression Screen PHQ 2/9 Scores 09/08/2016 12/28/2015 12/14/2015 06/04/2015  PHQ - 2 Score 2 0 0 0  PHQ- 9 Score 14 - - -     Cognitive Function MMSE - Mini Mental State Exam 09/08/2016 03/15/2016  Orientation to time 5 4  Orientation to Place 5 5  Registration 3 3  Attention/ Calculation 4 0  Recall 1 0  Language- name 2 objects 2 2  Language- repeat 1 1  Language- follow 3 step command 2 1  Language- read & follow direction 1 1  Write a sentence 1 1  Copy design 0 0  Total score 25 18   Montreal Cognitive Assessment  07/02/2014  Visuospatial/ Executive (0/5) 1  Naming (0/3) 2  Attention: Read list of digits (0/2) 2  Attention: Read list of letters (0/1) 0  Attention: Serial 7 subtraction starting at 100 (0/3) 0  Language: Repeat phrase (0/2) 2  Language : Fluency (0/1) 0  Abstraction (0/2) 0  Delayed Recall (0/5) 2  Orientation (0/6) 5  Total 14  Adjusted Score (based on education) 15      Immunization History  Administered Date(s) Administered  .  Influenza-Unspecified 12/10/2015  . Tdap 02/10/2015   Screening Tests Health Maintenance  Topic Date Due  . INFLUENZA VACCINE  10/11/2016  . TETANUS/TDAP  02/09/2025  . DEXA SCAN  Completed  . PNA vac Low Risk Adult  Completed      Plan:    I have personally reviewed and addressed the Medicare Annual Wellness questionnaire and have noted the following in the patient's chart:  A. Medical and social history B. Use of alcohol, tobacco or illicit drugs  C. Current medications and supplements D. Functional ability and status E.  Nutritional status F.  Physical activity G. Advance directives H. List of other physicians I.  Hospitalizations, surgeries, and ER visits in previous 12 months J.  [place] to include hearing, vision, cognitive, depression L. Referrals and appointments - none  In addition, I have reviewed and discussed with patient certain preventive protocols, quality metrics, and best practice recommendations. A written personalized care plan for preventive services as well as general preventive health recommendations were provided to patient.  See attached scanned questionnaire for additional information.   Signed,   Rich Reining, RN Nurse Health Advisor   Quick Notes   Health Maintenance: Up to date. Refill sent in for mementine     Abnormal Screen: PHQ-14 MMSE 25/30. Did not pass clock     Patient Concerns: None     Nurse Concerns: Pt c/o incontinence every night.  Pt was unsure of medications taken/not taken because they come in pill packs. Discussed eating habits with her. Numb hands R foot +1 pitting edema

## 2016-09-08 NOTE — Patient Instructions (Addendum)
Increase trazodone to 100mg  at bedtime to help sleep  Continue melatonin 1 tablet 3mg  at bedtime  Continue all other medications as ordered  Follow up with specialists as scheduled  Follow up in 3 mos for memory, insomnia and PAD

## 2016-09-09 LAB — BASIC METABOLIC PANEL WITH GFR
BUN: 29 mg/dL — ABNORMAL HIGH (ref 7–25)
CALCIUM: 9.5 mg/dL (ref 8.6–10.4)
CHLORIDE: 102 mmol/L (ref 98–110)
CO2: 25 mmol/L (ref 20–31)
Creat: 1.57 mg/dL — ABNORMAL HIGH (ref 0.60–0.88)
GFR, Est African American: 34 mL/min — ABNORMAL LOW (ref >=60)
GFR, Est Non African American: 29 mL/min — ABNORMAL LOW (ref >=60)
Glucose, Bld: 114 mg/dL — ABNORMAL HIGH (ref 65–99)
POTASSIUM: 4.2 mmol/L (ref 3.5–5.3)
SODIUM: 141 mmol/L (ref 135–146)

## 2016-09-09 LAB — ALT: ALT: 19 U/L (ref 6–29)

## 2016-09-09 LAB — LIPID PANEL
CHOL/HDL RATIO: 3.3 ratio (ref <5.0)
CHOLESTEROL: 182 mg/dL (ref <200)
HDL: 56 mg/dL (ref >50)
LDL Cholesterol: 61 mg/dL (ref <100)
Triglycerides: 325 mg/dL — ABNORMAL HIGH (ref <150)
VLDL: 65 mg/dL — AB (ref <30)

## 2016-09-09 LAB — TSH: TSH: 3.83 mIU/L

## 2016-09-24 ENCOUNTER — Other Ambulatory Visit: Payer: Self-pay | Admitting: Nurse Practitioner

## 2016-09-24 DIAGNOSIS — M2550 Pain in unspecified joint: Secondary | ICD-10-CM

## 2016-10-02 ENCOUNTER — Encounter: Payer: Self-pay | Admitting: Vascular Surgery

## 2016-10-12 ENCOUNTER — Ambulatory Visit (INDEPENDENT_AMBULATORY_CARE_PROVIDER_SITE_OTHER): Payer: PPO | Admitting: Vascular Surgery

## 2016-10-12 ENCOUNTER — Encounter: Payer: Self-pay | Admitting: Vascular Surgery

## 2016-10-12 ENCOUNTER — Ambulatory Visit (INDEPENDENT_AMBULATORY_CARE_PROVIDER_SITE_OTHER)
Admission: RE | Admit: 2016-10-12 | Discharge: 2016-10-12 | Disposition: A | Discharge status: Home / Self Care | Payer: PPO | Source: Ambulatory Visit | Attending: Vascular Surgery | Admitting: Vascular Surgery

## 2016-10-12 ENCOUNTER — Ambulatory Visit (HOSPITAL_COMMUNITY)
Admission: RE | Admit: 2016-10-12 | Discharge: 2016-10-12 | Disposition: A | Discharge status: Home / Self Care | Payer: PPO | Source: Ambulatory Visit | Attending: Vascular Surgery | Admitting: Vascular Surgery

## 2016-10-12 VITALS — BP 153/79 | HR 61 | Temp 97.2°F | Resp 18 | Ht 65.0 in | Wt 154.0 lb

## 2016-10-12 DIAGNOSIS — Z95820 Peripheral vascular angioplasty status with implants and grafts: Secondary | ICD-10-CM | POA: Insufficient documentation

## 2016-10-12 DIAGNOSIS — I739 Peripheral vascular disease, unspecified: Secondary | ICD-10-CM

## 2016-10-12 DIAGNOSIS — R9439 Abnormal result of other cardiovascular function study: Secondary | ICD-10-CM | POA: Insufficient documentation

## 2016-10-12 NOTE — Progress Notes (Signed)
[facility] [place]  Patient name: Misty Patterson MRN: [id number] DOB: 27-Sep-1926 Sex: female  REASON FOR VISIT: Follow-up  HPI:    Misty Patterson is a 83 y.o. female who presents for follow-up of her right above-knee popliteal stent was placed in 2015 by Dr. Oneida Alar. This was done for right leg claudication. The patient denies claudication however she still has some pain in her right leg. This started happening after she fell at [employer] 3 years ago and had some plastic wrap around her leg. She reports that her right leg is also cold all the time.  She reports some shortness of breath with minor activity such as getting up and out of a chair. However, she denies any shortness of breath with walking around the store. She previously had multiple level cervical fusion but still has some bilateral hand numbness.  Overall, her medical conditions are stable. These include past history of ovarian cancer, hypertension, hyperlipidemia, peripheral neuropathy, mild dementia.  PAST MEDICAL HISTORY:   Past Medical History:  Diagnosis Date  . Anxiety    takes Valium daily  . Cancer ([place])    ovarian  . Cervical disc disease   . Chronic back pain   . Chronic kidney disease   . COPD (chronic obstructive pulmonary disease) ([place])    ProAir daily as needed  . CTS (carpal tunnel syndrome)   . Decreased GFR   . Dementia    takes aricept  . Depression    takes Zoloft daily  . DJD (degenerative joint disease) of cervical spine   . DVT (deep venous thrombosis) ([facility])    right leg  . Dyslipidemia    but doesn't take any meds   . Esophageal dysmotility   . GERD (gastroesophageal reflux disease)    takes pepcid daily  . Hypertension    takes Amlodipine and Atenolol daily  . Incontinence of urine   . Insomnia    takes Trazodone nightly  . Neuropathy   . Nocturia   . Osteoporosis    takes Vit D daily  . Ovarian cancer ([place])   . Peripheral neuropathy   . Peripheral vascular  disease ([place])   . Pneumonia    hx of-15+yrs ago  . Radiculopathy   . Restless leg syndrome   . Urinary frequency   . Zenker's diverticulum    small    Family History  Problem Relation Age of Onset  . Hypertension Mother   . Stroke Mother   . Lung cancer Son        Deceased, 66  . Other Father        MVA  . Hypertension Brother   . Stroke Brother   . Kidney disease Sister   . Stroke Brother   . Diabetes Brother        DM  . Stroke Brother   . Cancer Sister        throat  . Healthy Son     Social History  Substance Use Topics  . Smoking status: Never Smoker  . Smokeless tobacco: Never Used  . Alcohol use No    Allergies  Allergen Reactions  . Aspirin Other (See Comments)    REACTION:  Choking sensation, possible throat swelling  . Formaldehyde Swelling    Tongue swelling  . Sodium Pentobarbital [Pentobarbital] Other (See Comments)    Memory Loss    MEDICATIONS:   Current Outpatient Prescriptions  Medication Sig Dispense Refill  . acetaminophen-codeine (TYLENOL #3)  300-30 MG tablet TAKE 1 TABLET BY MOUTH EVERY 8 HOURS AS NEEDED FOR PAIN 90 tablet 0  . Acetaminophen-Codeine 300-30 MG tablet Take 1 tablet by mouth 2 (two) times daily. Reported on 07/29/2015    . amLODipine (NORVASC) 5 MG tablet Take 5 mg by mouth 2 (two) times daily.     . B Complex-C (B-COMPLEX WITH VITAMIN C) tablet Take 1 tablet by mouth daily. Reported on 05/21/2015    . cholecalciferol (VITAMIN D) 1000 units tablet Take 1,000 Units by mouth daily.    . clopidogrel (PLAVIX) 75 MG tablet Take 75 mg by mouth daily.    . cyclobenzaprine (FLEXERIL) 5 MG tablet Take 5 mg by mouth every 8 (eight) hours as needed for muscle spasms.    . diazepam (VALIUM) 5 MG tablet Take 1 tablet (5 mg total) by mouth at bedtime. 3 tablet 0  . gabapentin (NEURONTIN) 600 MG tablet Take 600 mg by mouth at bedtime.    Marland Kitchen levothyroxine (SYNTHROID, LEVOTHROID) 25 MCG tablet Take 1 tablet (25 mcg total) by mouth daily  before breakfast. 90 tablet 3  . Memantine HCl-Donepezil HCl (NAMZARIC) 28-10 MG CP24 Take 1 capsule by mouth daily. 30 capsule 6  . methocarbamol (ROBAXIN) 500 MG tablet Take 500 mg by mouth 2 (two) times daily as needed for muscle spasms.     . Misc Natural Products (GLUCOSAMINE CHOND COMPLEX/MSM PO) Take 1 tablet by mouth daily.    . Misc Natural Products (SINUS FORMULA) TABS Take 1 tablet by mouth daily as needed.    . Multiple Vitamin (MULTIVITAMIN WITH MINERALS) TABS tablet Take 1 tablet by mouth daily.    . Nutritional Supplements (NUTRITIONAL SUPPLEMENT PO) Take 1 capsule by mouth 2 (two) times daily. Lipogen PS Plus    . Omega 3 1000 MG CAPS Take 1,000 mg by mouth daily.    Marland Kitchen PROVENTIL HFA 108 (90 Base) MCG/ACT inhaler INHALE 2 PUFFS INTO THE LUNGS EVERY 4 HOURS AS NEEDED FOR WHEEZING OR SHORTNESS OF BREATH 6.7 g 0  . sertraline (ZOLOFT) 50 MG tablet Take 50 mg by mouth at bedtime.    . sodium chloride (OCEAN) 0.65 % SOLN nasal spray Place 1 spray into both nostrils at bedtime as needed for congestion. Reported on 07/16/2015    . solifenacin (VESICARE) 5 MG tablet Take 5 mg by mouth daily.    . traZODone (DESYREL) 100 MG tablet Take 1 tablet (100 mg total) by mouth at bedtime. 90 tablet 3  . UNABLE TO FIND Med Name: Curell (supplement) Take 1 by mouth daily.     No current facility-administered medications for this visit.     REVIEW OF SYSTEMS:   REVIEW OF SYSTEMS (negative unless checked):   Cardiac:  []  Chest pain or chest pressure? [x]  Shortness of breath upon activity? []  Shortness of breath when lying flat? []  Irregular heart rhythm?  Vascular:  [x]  Pain in calf, thigh, or hip brought on by walking? []  Pain in feet at night that wakes you up from your sleep? []  Blood clot in your veins? []  Leg swelling?  Pulmonary:  []  Oxygen at home? []  Productive cough? []  Wheezing?  Neurologic:  []  Sudden weakness in arms or legs? []  Sudden numbness in arms or legs? []  Sudden  onset of difficult speaking or slurred speech? []  Temporary loss of vision in one eye? [x]  Problems with dizziness?  Gastrointestinal:  []  Blood in stool? []  Vomited blood?  Genitourinary:  []  Burning when urinating? []  Blood  in urine?  Psychiatric:  [x]  Major depression  Hematologic:  []  Bleeding problems? []  Problems with blood clotting?  Dermatologic:  []  Rashes or ulcers?  Constitutional:  []  Fever or chills?  Ear/Nose/Throat:  []  Change in hearing? []  Nose bleeds? []  Sore throat?  Musculoskeletal:  []  Back pain? []  Joint pain? []  Muscle pain?  PHYSICAL EXAM:   Vitals:   10/12/16 1350  BP: (!) 153/79  Pulse: 61  Resp: 18  Temp: (!) 97.2 F (36.2 C)  TempSrc: Oral  SpO2: 97%  Weight: 154 lb (69.9 kg)  Height: 5\' 5"  (1.651 m)    GENERAL: The patient is a well-nourished, pleasantly confused female, in no acute distress. The vital signs are documented above. HEENT: normocephalic, atraumatic. No abnormalities noted.  CARDIAC: There is a regular rate and rhythm.  VASCULAR: 2+ radial pulses bilaterally, 2+ right dorsalis pedis pulse, nonpalpable left pedal pulses PULMONARY: Normal respiratory effort. Lungs clear to auscultation bilaterally. MUSCULOSKELETAL: There are no major deformities or cyanosis. NEUROLOGIC: No focal weakness or paresthesias are detected. SKIN: There are no ulcers or rashes noted. PSYCHIATRIC: The patient has a normal affect.  DATA:    Right lower extremity arterial duplex and ABIs 10/13/2011  Right above-knee popliteal stent is patent. Right ABI 0.9, left ABI 0.71  ASSESSMENT/PLAN:   Right leg pain S/p right above knee popliteal stent (2015)  Right above knee popliteal stent is patent. Her leg pain is not related to arterial insufficiency but rather neurogenic versus arthritis. Follow up in one year with repeat stent duplex and ABIs.   Virgina Jock, PA-C [facility] MD:  [facility]  Patient with a palpable pulse in the right leg. The patient has overall been stable for some time now. Most of her aches and pains in her right leg are most likely degenerative arthritis are neurologic in nature. Patient was reassured today.  She will follow-up in one year with a repeat arterial duplex and ABIs.  Ruta Hinds, MD [facility] [place] Office: [phone] Pager: [phone]

## 2016-10-16 NOTE — Addendum Note (Signed)
Addended by: Lianne Cure A on: 10/16/2016 03:52 PM   Modules accepted: Orders

## 2016-10-19 ENCOUNTER — Telehealth: Payer: Self-pay

## 2016-10-19 MED ORDER — DONEPEZIL HCL 10 MG PO TABS: 10.0000 mg | ORAL_TABLET | Freq: Every day | ORAL | 3 refills | Status: DC

## 2016-10-19 MED ORDER — MEMANTINE HCL ER 28 MG PO CP24: 28.0000 mg | ORAL_CAPSULE | Freq: Every day | ORAL | 3 refills | Status: DC

## 2016-10-19 NOTE — Telephone Encounter (Signed)
Correction: prescriptions were sent to pharmacy for namenda XR 28 mg daily and donepezil 10 mg qhs.

## 2016-10-19 NOTE — Telephone Encounter (Signed)
yes

## 2016-10-19 NOTE — Telephone Encounter (Signed)
The pharmacist called and stated that the namzaric will cost the patient $151 but patient cannot afford this. The pharmacy would like to know if there is an alternative that may cost less?   Please advise.

## 2016-10-19 NOTE — Telephone Encounter (Signed)
The pharmacy tech stated that there was no way to find out the price of a medication without running the prescription.   Do you want prescriptions for namzeric ER 28 mg daily and donepezil 10 mg qhs sent to pharmacy?   Please advise.

## 2016-10-19 NOTE — Telephone Encounter (Signed)
What would the cost be if we change med to namzeric er 28mg  daily and donepizil 10mg  qhs?

## 2016-11-21 ENCOUNTER — Other Ambulatory Visit: Payer: Self-pay | Admitting: *Deleted

## 2016-11-21 DIAGNOSIS — M2550 Pain in unspecified joint: Secondary | ICD-10-CM

## 2016-11-21 MED ORDER — ACETAMINOPHEN-CODEINE #3 300-30 MG PO TABS: 1.0000 | ORAL_TABLET | Freq: Three times a day (TID) | ORAL | 0 refills | Status: DC | PRN

## 2016-11-21 NOTE — Telephone Encounter (Signed)
Patient requested 

## 2016-12-04 ENCOUNTER — Other Ambulatory Visit: Payer: Self-pay | Admitting: Internal Medicine

## 2016-12-13 ENCOUNTER — Encounter: Payer: Self-pay | Admitting: Internal Medicine

## 2016-12-13 ENCOUNTER — Ambulatory Visit (INDEPENDENT_AMBULATORY_CARE_PROVIDER_SITE_OTHER): Payer: PPO | Admitting: Internal Medicine

## 2016-12-13 VITALS — BP 130/76 | HR 60 | Temp 98.0°F | Resp 14 | Ht 65.0 in | Wt 154.4 lb

## 2016-12-13 DIAGNOSIS — Z23 Encounter for immunization: Secondary | ICD-10-CM

## 2016-12-13 DIAGNOSIS — N3281 Overactive bladder: Secondary | ICD-10-CM

## 2016-12-13 DIAGNOSIS — E039 Hypothyroidism, unspecified: Secondary | ICD-10-CM | POA: Diagnosis not present

## 2016-12-13 DIAGNOSIS — E782 Mixed hyperlipidemia: Secondary | ICD-10-CM | POA: Insufficient documentation

## 2016-12-13 DIAGNOSIS — L989 Disorder of the skin and subcutaneous tissue, unspecified: Secondary | ICD-10-CM | POA: Diagnosis not present

## 2016-12-13 DIAGNOSIS — J449 Chronic obstructive pulmonary disease, unspecified: Secondary | ICD-10-CM

## 2016-12-13 DIAGNOSIS — Z79899 Other long term (current) drug therapy: Secondary | ICD-10-CM | POA: Diagnosis not present

## 2016-12-13 DIAGNOSIS — I739 Peripheral vascular disease, unspecified: Secondary | ICD-10-CM

## 2016-12-13 DIAGNOSIS — F339 Major depressive disorder, recurrent, unspecified: Secondary | ICD-10-CM | POA: Insufficient documentation

## 2016-12-13 MED ORDER — ALBUTEROL SULFATE HFA 108 (90 BASE) MCG/ACT IN AERS: 2.0000 | INHALATION_SPRAY | RESPIRATORY_TRACT | 6 refills | Status: DC | PRN

## 2016-12-13 MED ORDER — DIAZEPAM 5 MG PO TABS: 5.0000 mg | ORAL_TABLET | Freq: Every day | ORAL | 0 refills | Status: DC

## 2016-12-13 NOTE — Progress Notes (Signed)
Patient ID: Misty Patterson, female   DOB: [birth year], 83 y.o.   MRN: [id number]   Location:  [facility] clinic  Provider:   Code Status:  Goals of Care:  Advanced Directives 12/13/2016  Does Patient Have a Medical Advance Directive? Yes  Type of Advance Directive Living will  Does patient want to make changes to medical advance directive? -  Copy of [place] in Chart? -  Would patient like information on creating a medical advance directive? -  Pre-existing out of facility DNR order (yellow form or pink MOST form) -     Chief Complaint  Patient presents with  . Medical Management of Chronic Issues    3 month follow up; flu shot; has been taking meds that were DC'd in Jan 2018; also wants a place on skin checked    HPI: Patient is a 83 y.o. female seen today for f/u. She states she is here for follow up of her hypothyroidism. She presents in office today with her neighbor. The patient states she does not know what medication she takes. She takes what is packaged up for her. She says she eats cholate wafers all day long because she states it has no cholesterol in them. She admits to a loss of appetite and only wants to eat chocolate wafers. The patient also complains of sinus congestion and tightness that stated a few days ago. She denies fever chills, and cough.  Anxiety - She takes valium qhs prn  She reports increased SOB since her fall several yrs ago in [place].   HTN - Controled on amlodipine and HCTZ. Patient states she still takes these two medications, but doe not think she is supposed to take them.  Hyperlipidemia - Need fasting labs.  Chronic back/neck pain - due to cervical spinal stenosis, peripheral neuropathy, radiculopathy and RLE. Followed by Ortho Dr Sharol Given and Dr Lynann Bologna  Dementia - improved on namzaric without ADRs. After reviewing chart, she is also taking 10mg  aricept. MMSE 25/30 (prev 18/30) in Jun 2018. Weight up 7 lbs  Hx ovarian CA - tx >30 yrs  ago but does not recall exact tx. In remission  PAD - has RLE pain. Takes plavix. She is s/p right above-knee popliteal artery stenting in 2015. In June 2017, ABI on right 0.97; left 0.91. Followed by vascular sx Dr Oneida Alar  Hypothyroidism - dx in Oct 2017 by Dr Nolon Rod. She was started on synthroid 45mcg daily but does not recall repeat labs and unsure if she is taking medication now; free T4 level 1.2; TSH 3.89.  MDD/insomnia - stable on sertraline. She takes trazodone to help sleep along with melatonin  OAB - stable on vesicare    Past Medical History:  Diagnosis Date  . Anxiety    takes Valium daily  . Cancer ([place])    ovarian  . Cervical disc disease   . Chronic back pain   . Chronic kidney disease   . COPD (chronic obstructive pulmonary disease) ([place])    ProAir daily as needed  . CTS (carpal tunnel syndrome)   . Decreased GFR   . Dementia    takes aricept  . Depression    takes Zoloft daily  . DJD (degenerative joint disease) of cervical spine   . DVT (deep venous thrombosis) ([facility])    right leg  . Dyslipidemia    but doesn't take any meds   . Esophageal dysmotility   . GERD (gastroesophageal reflux disease)    takes pepcid  daily  . Hypertension    takes Amlodipine and Atenolol daily  . Incontinence of urine   . Insomnia    takes Trazodone nightly  . Neuropathy   . Nocturia   . Osteoporosis    takes Vit D daily  . Ovarian cancer ([place])   . Peripheral neuropathy   . Peripheral vascular disease ([place])   . Pneumonia    hx of-15+yrs ago  . Radiculopathy   . Restless leg syndrome   . Urinary frequency   . Zenker's diverticulum    small    Past Surgical History:  Procedure Laterality Date  . ABDOMINAL AORTAGRAM  Feb. 20, 2015  . ABDOMINAL AORTAGRAM N/A 05/02/2013   Procedure: ABDOMINAL Maxcine Ham;  Surgeon: Elam Dutch, MD;  Location[facility] CATH LAB;  Service: Cardiovascular;  Laterality: N/A;  . ABDOMINAL HYSTERECTOMY    . BACK SURGERY    . cataract  surgery Bilateral   . COLONOSCOPY    . POSTERIOR CERVICAL FUSION/FORAMINOTOMY N/A 01/28/2015   Procedure: POSTERIOR CERVICAL DECOMPRESSION FUSION LEVEL 5 WITH INSTRUMENTATION AND ALLOGRAFT;  Surgeon: Phylliss Bob, MD;  Location: [place];  Service: Orthopedics;  Laterality: N/A;  Cervical posterior decompression fusion, cervical 3-4, cervical 4-5, cervical 5-6, cervical 6-7, cervical 6-thoracic 1 with instrumentation and allograft.  . TONSILLECTOMY    . VEIN SURGERY  1950    Allergies  Allergen Reactions  . Aspirin Other (See Comments)    REACTION:  Choking sensation, possible throat swelling  . Formaldehyde Swelling    Tongue swelling  . Sodium Pentobarbital [Pentobarbital] Other (See Comments)    Memory Loss    Outpatient Encounter Prescriptions as of 12/13/2016  Medication Sig  . acetaminophen-codeine (TYLENOL #3) 300-30 MG tablet Take 1 tablet by mouth every 8 (eight) hours as needed. for pain  . amLODipine (NORVASC) 5 MG tablet Take 5 mg by mouth 2 (two) times daily.   . B Complex-C (B-COMPLEX WITH VITAMIN C) tablet Take 1 tablet by mouth daily. Reported on 05/21/2015  . cholecalciferol (VITAMIN D) 1000 units tablet Take 1,000 Units by mouth daily.  . clopidogrel (PLAVIX) 75 MG tablet Take 75 mg by mouth daily.  Marland Kitchen donepezil (ARICEPT) 10 MG tablet Take 1 tablet (10 mg total) by mouth at bedtime.  . gabapentin (NEURONTIN) 600 MG tablet Take 600 mg by mouth at bedtime.  Marland Kitchen levothyroxine (SYNTHROID, LEVOTHROID) 25 MCG tablet Take 1 tablet (25 mcg total) by mouth daily before breakfast.  . memantine (NAMENDA XR) 28 MG CP24 24 hr capsule Take 1 capsule (28 mg total) by mouth daily.  . Misc Natural Products (GLUCOSAMINE CHOND COMPLEX/MSM PO) Take 1 tablet by mouth daily.  . Misc Natural Products (SINUS FORMULA) TABS Take 1 tablet by mouth daily as needed.  . Multiple Vitamin (MULTIVITAMIN WITH MINERALS) TABS tablet Take 1 tablet by mouth daily.  Marland Kitchen PROVENTIL HFA 108 (90 Base) MCG/ACT inhaler  INHALE 2 PUFFS INTO THE LUNGS EVERY 4 HOURS AS NEEDED FOR WHEEZING OR SHORTNESS OF BREATH  . sertraline (ZOLOFT) 50 MG tablet Take 50 mg by mouth at bedtime.  . sodium chloride (OCEAN) 0.65 % SOLN nasal spray Place 1 spray into both nostrils at bedtime as needed for congestion. Reported on 07/16/2015  . solifenacin (VESICARE) 5 MG tablet Take 5 mg by mouth daily.  . traZODone (DESYREL) 100 MG tablet Take 1 tablet (100 mg total) by mouth at bedtime.  . cyclobenzaprine (FLEXERIL) 5 MG tablet Take 5 mg by mouth every 8 (eight) hours as  needed for muscle spasms.  . diazepam (VALIUM) 5 MG tablet Take 1 tablet (5 mg total) by mouth at bedtime. (Patient not taking: Reported on 12/13/2016)  . methocarbamol (ROBAXIN) 500 MG tablet Take 500 mg by mouth 2 (two) times daily as needed for muscle spasms.   . Nutritional Supplements (NUTRITIONAL SUPPLEMENT PO) Take 1 capsule by mouth 2 (two) times daily. Lipogen PS Plus  . Omega 3 1000 MG CAPS Take 1,000 mg by mouth daily.  Marland Kitchen UNABLE TO FIND Med Name: Curell (supplement) Take 1 by mouth daily.  . [DISCONTINUED] acetaminophen-codeine (TYLENOL #3) 300-30 MG tablet Take 1 tablet by mouth every 8 (eight) hours as needed. for pain   No facility-administered encounter medications on file as of 12/13/2016.     Review of Systems:  Review of Systems  Constitutional: Positive for appetite change (Patient states she only eats twice a day. She admits to eating mostly sweets.). Negative for activity change, chills, diaphoresis, fatigue, fever and unexpected weight change.  HENT: Positive for postnasal drip. Negative for congestion, dental problem, drooling, ear discharge, ear pain, facial swelling, hearing loss, mouth sores, nosebleeds, rhinorrhea, sinus pain, sinus pressure, sneezing, sore throat, tinnitus, trouble swallowing and voice change.   Eyes: Negative for photophobia, pain, discharge, redness, itching and visual disturbance.  Respiratory: Negative for apnea, cough,  choking, chest tightness, shortness of breath, wheezing and stridor.   Cardiovascular: Negative for chest pain, palpitations and leg swelling.  Gastrointestinal: Negative for abdominal distention, abdominal pain, anal bleeding, blood in stool, constipation, diarrhea, nausea, rectal pain and vomiting.  Endocrine: Negative for cold intolerance, heat intolerance, polydipsia, polyphagia and polyuria.  Genitourinary: Negative for decreased urine volume, difficulty urinating, dyspareunia, dysuria, enuresis, flank pain, frequency, genital sores, hematuria, pelvic pain, urgency and vaginal pain.  Musculoskeletal: Positive for arthralgias (Left knee pain while standing). Negative for back pain, gait problem, joint swelling, myalgias, neck pain and neck stiffness.  Skin: Negative for color change, pallor, rash and wound.  Allergic/Immunologic: Negative for environmental allergies, food allergies and immunocompromised state.  Neurological: Negative for dizziness, tremors, seizures, syncope, facial asymmetry, speech difficulty, weakness, light-headedness, numbness and headaches.  Hematological: Negative for adenopathy. Does not bruise/bleed easily.  Psychiatric/Behavioral: Negative for agitation, behavioral problems, confusion, decreased concentration, dysphoric mood, hallucinations, self-injury, sleep disturbance and suicidal ideas. The patient is not nervous/anxious and is not hyperactive.     Health Maintenance  Topic Date Due  . INFLUENZA VACCINE  10/11/2016  . TETANUS/TDAP  02/09/2025  . DEXA SCAN  Completed  . PNA vac Low Risk Adult  Completed    Physical Exam: Vitals:   12/13/16 1034  BP: 130/76  Pulse: 60  Resp: 14  Temp: 98 F (36.7 C)  TempSrc: Oral  SpO2: 98%  Weight: 154 lb 6.4 oz (70 kg)  Height: 5\' 5"  (1.651 m)   Body mass index is 25.69 kg/m. Physical Exam  Constitutional: She is oriented to person, place, and time. She appears well-developed and well-nourished. No distress.    HENT:  Head: Normocephalic and atraumatic.  Right Ear: External ear normal.  Left Ear: External ear normal.  Nose: Nose normal.  Mouth/Throat: Oropharynx is clear and moist. No oropharyngeal exudate.  Eyes: Pupils are equal, round, and reactive to light. Conjunctivae and EOM are normal. Right eye exhibits no discharge. Left eye exhibits no discharge. No scleral icterus.  Neck: Normal range of motion. Neck supple. No JVD present. No tracheal deviation present. No thyromegaly present.  Cardiovascular: Normal rate, regular rhythm and intact  distal pulses.   Murmur (1/6 SEM) heard. Pulmonary/Chest: Effort normal and breath sounds normal. No stridor. No respiratory distress. She has no wheezes. She has no rales. She exhibits no tenderness.  Abdominal: Soft. Bowel sounds are normal. She exhibits no distension and no mass. There is no tenderness. There is no rebound and no guarding.  Musculoskeletal: Normal range of motion. She exhibits no edema, tenderness or deformity.  Lymphadenopathy:    She has no cervical adenopathy.  Neurological: She is alert and oriented to person, place, and time. No cranial nerve deficit. Coordination normal.  Skin: Skin is warm and dry. No rash noted. She is not diaphoretic. No erythema. No pallor.  Psychiatric: She has a normal mood and affect. Her behavior is normal. Judgment and thought content normal.    Labs reviewed: Basic Metabolic Panel:  Recent Labs  12/14/15 1120 12/28/15 1053 03/15/16 1240 09/08/16 1235  NA 140 141 142 141  K 4.3 3.9 4.0 4.2  CL 102 103 103 102  CO2 30 29 25 25   GLUCOSE 95 124* 139* 114*  BUN 32* 26* 29* 29*  CREATININE 1.45* 1.28* 1.30* 1.57*  CALCIUM 9.3 9.3 9.5 9.5  TSH 6.13*  --  3.89 3.83   Liver Function Tests:  Recent Labs  12/14/15 1120 03/15/16 1240 09/08/16 1235  AST 31 26  --   ALT 17 20 19   ALKPHOS 52 53  --   BILITOT 0.4 0.4  --   PROT 7.1 7.1  --   ALBUMIN 4.4 4.5  --    No results for input(s):  LIPASE, AMYLASE in the last 8760 hours. No results for input(s): AMMONIA in the last 8760 hours. CBC:  Recent Labs  12/14/15 1120 03/15/16 1240  WBC 5.8 6.4  NEUTROABS  --  3,200  HGB 13.0 13.6  HCT 40.5 42.6  MCV 88.2 90.4  PLT 201 219   Lipid Panel:  Recent Labs  03/15/16 1240 09/08/16 1235  CHOL 188 182  HDL 62 56  LDLCALC 81 61  TRIG 225* 325*  CHOLHDL 3.0 3.3   Lab Results  Component Value Date   HGBA1C 5.7 (H) 03/15/2016    Procedures since last visit: No results found.  Assessment/Plan  Fasting labs in one week: TSH, LIPID, BMP, CBCw/diff.  Call with results.  Continue with ordered medications.  Counciling given about diet and nutrition. Recommend patient to add protein drink to diet such as BOOST.  Return in 3 months for f/u of HTN, thyroid, hyperlipidemia, Dementia, and diet.   Labs/tests ordered:   Next appt:  Visit date not found

## 2016-12-13 NOTE — Progress Notes (Signed)
Patient ID: Misty Patterson, female   DOB: August 11, 1926, 83 y.o.   MRN: [id number]    Location:  [facility] Place of Service: OFFICE  Chief Complaint  Patient presents with  . Medical Management of Chronic Issues    3 month follow up; flu shot; has been taking meds that were DC'd in Jan 2018; also wants a place on skin checked    HPI:  83 yo female seen today for f/u. She has left hand skin lesion x "many moons" that is bleeding. She has a few other places on left face and right arm also. She has a bad cold - nasal congestion, dry cough, sinus pressure, dizziness (chronic). No f/c, sore throat, CP, SOB. She is a poor historian due to dementia. Hx obtained from chart.  Anxiety - She takes valium qhs prn  She reports increased SOB since her fall several yrs ago in [place].   Chronic back/neck pain - due to cervical spinal stenosis, peripheral neuropathy, radiculopathy and RLE. Followed by Ortho Dr Sharol Given and Dr Lynann Bologna  Dementia - improved on namzaric without ADRs. After reviewing chart, she is also taking 109m aricept. MMSE 25/30 (prev 18/30) in Jun 2018. Weight down 1 lbs  Prediabetes - stable. A1c 5.7%  Hx ovarian CA - tx >30 yrs ago but does not recall exact tx. In remission  PAD - has RLE pain. Takes plavix. She is s/p right above-knee popliteal artery stenting in 2015. In June 2017, ABI on right 0.97; left 0.91. Followed by vascular sx Dr FOneida Alar Hypothyroidism - dx in Oct 2017 by Dr SNolon Rod She was started on synthroid 247m daily but does not recall repeat labs and unsure if she is taking medication now; free T4 level 1.2; TSH 3.83  MDD/insomnia - mood stable on sertraline. She takes trazodone to help sleep along with melatonin  OAB - stable on vesicare  Hyperlipidemia - diet controlled. LDL 61  Past Medical History:  Diagnosis Date  . Anxiety    takes Valium daily  . Cancer ([facility]   ovarian  . Cervical disc disease   . Chronic back pain   . Chronic kidney disease   . COPD  (chronic obstructive pulmonary disease) ([facility] [place]   ProAir daily as needed  . CTS (carpal tunnel syndrome)   . Decreased GFR   . Dementia    takes aricept  . Depression    takes Zoloft daily  . DJD (degenerative joint disease) of cervical spine   . DVT (deep venous thrombosis) (HCC)    right leg  . Dyslipidemia    but doesn't take any meds   . Esophageal dysmotility   . GERD (gastroesophageal reflux disease)    takes pepcid daily  . Hypertension    takes Amlodipine and Atenolol daily  . Incontinence of urine   . Insomnia    takes Trazodone nightly  . Neuropathy   . Nocturia   . Osteoporosis    takes Vit D daily  . Ovarian cancer ([facility] [place]  . Peripheral neuropathy   . Peripheral vascular disease ([facility]  . Pneumonia    hx of-15+yrs ago  . Radiculopathy   . Restless leg syndrome   . Urinary frequency   . Zenker's diverticulum    small    Past Surgical History:  Procedure Laterality Date  . ABDOMINAL AORTAGRAM  Feb. 20, 2015  . ABDOMINAL AORTAGRAM N/A 05/02/2013   Procedure: ABDOMINAL AOMaxcine Ham Surgeon: ChElam DutchMD;  [facility];  Service: Cardiovascular;  Laterality: N/A;  . ABDOMINAL HYSTERECTOMY    . BACK SURGERY    . cataract surgery Bilateral   . COLONOSCOPY    . POSTERIOR CERVICAL FUSION/FORAMINOTOMY N/A 01/28/2015   Procedure: POSTERIOR CERVICAL DECOMPRESSION FUSION LEVEL 5 WITH INSTRUMENTATION AND ALLOGRAFT;  Surgeon: Phylliss Bob, MD;  Location: [place];  Service: Orthopedics;  Laterality: N/A;  Cervical posterior decompression fusion, cervical 3-4, cervical 4-5, cervical 5-6, cervical 6-7, cervical 6-thoracic 1 with instrumentation and allograft.  . TONSILLECTOMY    . [place]    Patient Care Team: Gildardo Cranker, DO as PCP - General (Internal Medicine)  Social History   Social History  . Marital status: Widowed    Spouse name: N/A  . Number of children: 3  . Years of education: N/A   Occupational History  .  Retired    Social History Main Topics  . Smoking status: Never Smoker  . Smokeless tobacco: Never Used  . Alcohol use No  . Drug use: No  . Sexual activity: No   Other Topics Concern  . Not on file   Social History Narrative   Diet? no      Do you drink/eat things with caffeine?  Coffee, 1 cup      Marital status?    widow                                What year were you married? 17      Do you live in a house, apartment, assisted living, condo, trailer, etc.? House      Is it one or more stories? 1      How many persons live in your home?       Do you have any pets in your home? (please list)  1      Current or past profession: hair dresser      Do you exercise?                yes                      Type & how often? Walk now in house      Do you have a living will? yes      Do you have a DNR form?                                  If not, do you want to discuss one?      Do you have signed POA/HPOA for forms?         reports that she has never smoked. She has never used smokeless tobacco. She reports that she does not drink alcohol or use drugs.  Family History  Problem Relation Age of Onset  . Hypertension Mother   . Stroke Mother   . Lung cancer Son        Deceased, 34  . Other Father        MVA  . Hypertension Brother   . Stroke Brother   . Kidney disease Sister   . Stroke Brother   . Diabetes Brother        DM  . Stroke Brother   . Cancer Sister        throat  . Healthy Son    Family  Status  Relation Status  . Mother Deceased at age 59  . Son Deceased at age 42  . Father Deceased at age 63  . Brother Deceased  . Sister Deceased  . Brother Deceased  . Brother Deceased  . Sister Deceased  . Sister Deceased  . Brother Alive  . Son (Not Specified)     Allergies  Allergen Reactions  . Aspirin Other (See Comments)    REACTION:  Choking sensation, possible throat swelling  . Formaldehyde Swelling    Tongue swelling  . Sodium Pentobarbital  [Pentobarbital] Other (See Comments)    Memory Loss    Medications: Patient's Medications  New Prescriptions   No medications on file  Previous Medications   ACETAMINOPHEN-CODEINE (TYLENOL #3) 300-30 MG TABLET    Take 1 tablet by mouth every 8 (eight) hours as needed. for pain   AMLODIPINE (NORVASC) 5 MG TABLET    Take 5 mg by mouth 2 (two) times daily.    B COMPLEX-C (B-COMPLEX WITH VITAMIN C) TABLET    Take 1 tablet by mouth daily. Reported on 05/21/2015   CHOLECALCIFEROL (VITAMIN D) 1000 UNITS TABLET    Take 1,000 Units by mouth daily.   CLOPIDOGREL (PLAVIX) 75 MG TABLET    Take 75 mg by mouth daily.   CYCLOBENZAPRINE (FLEXERIL) 5 MG TABLET    Take 5 mg by mouth every 8 (eight) hours as needed for muscle spasms.   DIAZEPAM (VALIUM) 5 MG TABLET    Take 1 tablet (5 mg total) by mouth at bedtime.   DONEPEZIL (ARICEPT) 10 MG TABLET    Take 1 tablet (10 mg total) by mouth at bedtime.   GABAPENTIN (NEURONTIN) 600 MG TABLET    Take 600 mg by mouth at bedtime.   LEVOTHYROXINE (SYNTHROID, LEVOTHROID) 25 MCG TABLET    Take 1 tablet (25 mcg total) by mouth daily before breakfast.   MEMANTINE (NAMENDA XR) 28 MG CP24 24 HR CAPSULE    Take 1 capsule (28 mg total) by mouth daily.   METHOCARBAMOL (ROBAXIN) 500 MG TABLET    Take 500 mg by mouth 2 (two) times daily as needed for muscle spasms.    MISC NATURAL PRODUCTS (GLUCOSAMINE CHOND COMPLEX/MSM PO)    Take 1 tablet by mouth daily.   MISC NATURAL PRODUCTS (SINUS FORMULA) TABS    Take 1 tablet by mouth daily as needed.   MULTIPLE VITAMIN (MULTIVITAMIN WITH MINERALS) TABS TABLET    Take 1 tablet by mouth daily.   NUTRITIONAL SUPPLEMENTS (NUTRITIONAL SUPPLEMENT PO)    Take 1 capsule by mouth 2 (two) times daily. Lipogen PS Plus   OMEGA 3 1000 MG CAPS    Take 1,000 mg by mouth daily.   PROVENTIL HFA 108 (90 BASE) MCG/ACT INHALER    INHALE 2 PUFFS INTO THE LUNGS EVERY 4 HOURS AS NEEDED FOR WHEEZING OR SHORTNESS OF BREATH   SERTRALINE (ZOLOFT) 50 MG TABLET     Take 50 mg by mouth at bedtime.   SODIUM CHLORIDE (OCEAN) 0.65 % SOLN NASAL SPRAY    Place 1 spray into both nostrils at bedtime as needed for congestion. Reported on 07/16/2015   SOLIFENACIN (VESICARE) 5 MG TABLET    Take 5 mg by mouth daily.   TRAZODONE (DESYREL) 100 MG TABLET    Take 1 tablet (100 mg total) by mouth at bedtime.   UNABLE TO FIND    Med Name: Curell (supplement) Take 1 by mouth daily.  Modified Medications   No medications  on file  Discontinued Medications   ACETAMINOPHEN-CODEINE (TYLENOL #3) 300-30 MG TABLET    Take 1 tablet by mouth every 8 (eight) hours as needed. for pain    Review of Systems  Unable to perform ROS: Dementia (memory loss)    Vitals:   12/13/16 1034  BP: 130/76  Pulse: 60  Resp: 14  Temp: 98 F (36.7 C)  TempSrc: Oral  SpO2: 98%  Weight: 154 lb 6.4 oz (70 kg)  Height: '5\' 5"'  (1.651 m)   Body mass index is 25.69 kg/m.  Physical Exam  Constitutional: She appears well-developed and well-nourished.  HENT:  Mouth/Throat: Oropharynx is clear and moist. No oropharyngeal exudate.  MMM; no oral thrush  Eyes: Pupils are equal, round, and reactive to light. No scleral icterus.  Neck: Neck supple. Carotid bruit is not present. No tracheal deviation present. No thyromegaly present.  Cardiovascular: Normal rate, regular rhythm, normal heart sounds and intact distal pulses.  Exam reveals no gallop and no friction rub.   No murmur heard. No LE edema b/l. no calf TTP.   Pulmonary/Chest: Effort normal and breath sounds normal. No stridor. No respiratory distress. She has no wheezes. She has no rales.  Abdominal: Soft. Normal appearance and bowel sounds are normal. She exhibits no distension and no mass. There is no hepatomegaly. There is no tenderness. There is no rigidity, no rebound and no guarding. No hernia.  Musculoskeletal: She exhibits edema.  Lymphadenopathy:    She has no cervical adenopathy.  Neurological: She is alert.  Skin: Skin is warm and  dry. Lesion (ulcerating bleeding) noted. No rash noted.     Psychiatric: She has a normal mood and affect. Her behavior is normal.     Labs reviewed: No visits with results within 3 Month(s) from this visit.  Latest known visit with results is:  Office Visit on 09/08/2016  Component Date Value Ref Range Status  . Sodium 09/08/2016 141  135 - 146 mmol/L Final  . Potassium 09/08/2016 4.2  3.5 - 5.3 mmol/L Final  . Chloride 09/08/2016 102  98 - 110 mmol/L Final  . CO2 09/08/2016 25  20 - 31 mmol/L Final  . Glucose, Bld 09/08/2016 114* 65 - 99 mg/dL Final  . BUN 09/08/2016 29* 7 - 25 mg/dL Final  . Creat 09/08/2016 1.57* 0.60 - 0.88 mg/dL Final   Comment:   For patients > or = 81 years of age: The upper reference limit for Creatinine is approximately 13% higher for people identified as African-American.     . Calcium 09/08/2016 9.5  8.6 - 10.4 mg/dL Final  . GFR, Est African American 09/08/2016 34* >=60 mL/min Final  . GFR, Est Non African American 09/08/2016 29* >=60 mL/min Final  . ALT 09/08/2016 19  6 - 29 U/L Final  . Cholesterol 09/08/2016 182  <200 mg/dL Final  . Triglycerides 09/08/2016 325* <150 mg/dL Final  . HDL 09/08/2016 56  >50 mg/dL Final  . Total CHOL/HDL Ratio 09/08/2016 3.3  <5.0 Ratio Final  . VLDL 09/08/2016 65* <30 mg/dL Final  . LDL Cholesterol 09/08/2016 61  <100 mg/dL Final  . TSH 09/08/2016 3.83  mIU/L Final   Comment:   Reference Range   > or = 20 Years  0.40-4.50   Pregnancy Range First trimester  0.26-2.66 Second trimester 0.55-2.73 Third trimester  0.43-2.91       No results found.   Assessment/Plan   ICD-10-CM   1. Chronic obstructive pulmonary disease, unspecified COPD type ([place])  J44.9 albuterol (PROAIR HFA) 108 (90 Base) MCG/ACT inhaler  2. Skin lesion L98.9 Ambulatory referral to Dermatology  3. Acquired hypothyroidism E03.9 TSH  4. Recurrent major depressive disorder, remission status unspecified (HCC) F33.9   5. OAB (overactive  bladder) N32.81   6. PAD (peripheral artery disease) (HCC) I73.9   7. High risk medication use Z79.899 BMP with eGFR    ALT    CBC with Differential/Platelets  8. Mixed hyperlipidemia E78.2 Lipid Panel  9. Need for immunization against influenza Z23 Flu Vaccine QUAD 36+ mos IM   Will call pill pack company to check medications  Continue meds for now as ordered  Will call with lab results  Follow up in 3 mos for dementia, PAD, hypothyroidism    Margues Filippini S. Perlie Gold  [facility] [address] [place], [place] [address] [phone] Cell (Monday-Friday 8 AM - 5 PM) [phone] After 5 PM and follow prompts

## 2016-12-13 NOTE — Patient Instructions (Addendum)
Will call pill pack company to check medications  Continue meds for now as ordered  Will call with lab results  Follow up in 3 mos for dementia, PAD, hypothyroidism

## 2016-12-14 LAB — LIPID PANEL
CHOL/HDL RATIO: 3.2 (calc) (ref <5.0)
CHOLESTEROL: 180 mg/dL (ref <200)
HDL: 57 mg/dL (ref >50)
LDL CHOLESTEROL (CALC): 90 mg/dL
NON-HDL CHOLESTEROL (CALC): 123 mg/dL (ref <130)
TRIGLYCERIDES: 248 mg/dL — AB (ref <150)

## 2016-12-14 LAB — BASIC METABOLIC PANEL WITH GFR
BUN/Creatinine Ratio: 17 (calc) (ref 6–22)
BUN: 24 mg/dL (ref 7–25)
CALCIUM: 9.6 mg/dL (ref 8.6–10.4)
CHLORIDE: 99 mmol/L (ref 98–110)
CO2: 32 mmol/L (ref 20–32)
Creat: 1.4 mg/dL — ABNORMAL HIGH (ref 0.60–0.88)
GFR, Est African American: 39 mL/min/{1.73_m2} — ABNORMAL LOW (ref >=60)
GFR, Est Non African American: 33 mL/min/{1.73_m2} — ABNORMAL LOW (ref >=60)
GLUCOSE: 109 mg/dL (ref 65–139)
Potassium: 3.7 mmol/L (ref 3.5–5.3)
Sodium: 140 mmol/L (ref 135–146)

## 2016-12-14 LAB — CBC WITH DIFFERENTIAL/PLATELET
BASOS PCT: 0.7 %
Basophils Absolute: 50 cells/uL (ref 0–200)
EOS ABS: 149 {cells}/uL (ref 15–500)
Eosinophils Relative: 2.1 %
HCT: 43.7 % (ref 35.0–45.0)
Hemoglobin: 14.3 g/dL (ref 11.7–15.5)
Lymphs Abs: 2748 cells/uL (ref 850–3900)
MCH: 28.1 pg (ref 27.0–33.0)
MCHC: 32.7 g/dL (ref 32.0–36.0)
MCV: 85.9 fL (ref 80.0–100.0)
MONOS PCT: 8.7 %
MPV: 9.3 fL (ref 7.5–12.5)
Neutro Abs: 3536 cells/uL (ref 1500–7800)
Neutrophils Relative %: 49.8 %
Platelets: 253 10*3/uL (ref 140–400)
RBC: 5.09 10*6/uL (ref 3.80–5.10)
RDW: 12.9 % (ref 11.0–15.0)
TOTAL LYMPHOCYTE: 38.7 %
WBC mixed population: 618 cells/uL (ref 200–950)
WBC: 7.1 10*3/uL (ref 3.8–10.8)

## 2016-12-14 LAB — ALT: ALT: 20 U/L (ref 6–29)

## 2016-12-14 LAB — TSH: TSH: 2.58 mIU/L (ref 0.40–4.50)

## 2016-12-18 ENCOUNTER — Telehealth: Payer: Self-pay | Admitting: Family Medicine

## 2016-12-18 NOTE — Telephone Encounter (Signed)
Pill pack pharmacy is calling to request a refill of pt's gabapentin 300mg .   [phone]

## 2016-12-19 NOTE — Telephone Encounter (Signed)
Is it ok to fill this?  Please advise. Dgaddy, CMA

## 2016-12-24 MED ORDER — GABAPENTIN 600 MG PO TABS: 600.0000 mg | ORAL_TABLET | Freq: Every day | ORAL | 1 refills | Status: DC

## 2016-12-24 NOTE — Telephone Encounter (Signed)
Gabapentin sent in.

## 2017-01-09 ENCOUNTER — Other Ambulatory Visit: Payer: Self-pay | Admitting: Internal Medicine

## 2017-01-10 NOTE — Telephone Encounter (Signed)
Ok to refill? Last filled 12/13/16 #30 0RF

## 2017-01-11 NOTE — Telephone Encounter (Signed)
Ok x 1

## 2017-02-02 ENCOUNTER — Other Ambulatory Visit: Payer: Self-pay | Admitting: Internal Medicine

## 2017-02-02 DIAGNOSIS — M2550 Pain in unspecified joint: Secondary | ICD-10-CM

## 2017-02-05 ENCOUNTER — Other Ambulatory Visit: Payer: Self-pay | Admitting: Internal Medicine

## 2017-02-05 DIAGNOSIS — M2550 Pain in unspecified joint: Secondary | ICD-10-CM

## 2017-02-05 NOTE — Telephone Encounter (Signed)
rx printed and ready to be signed by provider, controlled substance database confirmed.

## 2017-02-13 ENCOUNTER — Other Ambulatory Visit: Payer: Self-pay | Admitting: Dermatology

## 2017-02-13 DIAGNOSIS — L821 Other seborrheic keratosis: Secondary | ICD-10-CM | POA: Diagnosis not present

## 2017-02-13 DIAGNOSIS — D229 Melanocytic nevi, unspecified: Secondary | ICD-10-CM | POA: Diagnosis not present

## 2017-02-13 DIAGNOSIS — C44629 Squamous cell carcinoma of skin of left upper limb, including shoulder: Secondary | ICD-10-CM | POA: Diagnosis not present

## 2017-02-13 DIAGNOSIS — L570 Actinic keratosis: Secondary | ICD-10-CM | POA: Diagnosis not present

## 2017-02-17 ENCOUNTER — Other Ambulatory Visit: Payer: Self-pay | Admitting: Internal Medicine

## 2017-02-28 ENCOUNTER — Other Ambulatory Visit: Payer: Self-pay | Admitting: Internal Medicine

## 2017-02-28 NOTE — Telephone Encounter (Signed)
A refill request was received from pharmacy for diazepam 5 mg tablets. Rx was called in to pharmacy after verifying last fill date, provider, and quantity on PMP [place].

## 2017-03-21 ENCOUNTER — Ambulatory Visit (INDEPENDENT_AMBULATORY_CARE_PROVIDER_SITE_OTHER): Payer: PPO | Admitting: Internal Medicine

## 2017-03-21 ENCOUNTER — Encounter: Payer: Self-pay | Admitting: Internal Medicine

## 2017-03-21 VITALS — BP 142/80 | HR 63 | Temp 97.5°F | Ht 65.0 in | Wt 160.4 lb

## 2017-03-21 DIAGNOSIS — E782 Mixed hyperlipidemia: Secondary | ICD-10-CM

## 2017-03-21 DIAGNOSIS — M2550 Pain in unspecified joint: Secondary | ICD-10-CM

## 2017-03-21 DIAGNOSIS — F0390 Unspecified dementia without behavioral disturbance: Secondary | ICD-10-CM | POA: Diagnosis not present

## 2017-03-21 DIAGNOSIS — G4700 Insomnia, unspecified: Secondary | ICD-10-CM

## 2017-03-21 DIAGNOSIS — R7303 Prediabetes: Secondary | ICD-10-CM

## 2017-03-21 DIAGNOSIS — E039 Hypothyroidism, unspecified: Secondary | ICD-10-CM | POA: Diagnosis not present

## 2017-03-21 MED ORDER — ACETAMINOPHEN-CODEINE #3 300-30 MG PO TABS: 1.0000 | ORAL_TABLET | Freq: Three times a day (TID) | ORAL | 0 refills | Status: DC | PRN

## 2017-03-21 MED ORDER — MEMANTINE HCL ER 28 MG PO CP24: 28.0000 mg | ORAL_CAPSULE | Freq: Every day | ORAL | 6 refills | Status: DC

## 2017-03-21 MED ORDER — TRAZODONE HCL 50 MG PO TABS: 50.0000 mg | ORAL_TABLET | Freq: Every day | ORAL | 6 refills | Status: DC

## 2017-03-21 NOTE — Patient Instructions (Addendum)
RESTART TRAZODONE 50MG  AT BEDTIME TO HELP SLEEP - will be in next pill pak delivery  Continue other medications as ordered  Will call with lab results   Follow up in 3 mos for dementia, insomnia, MDD, chronic back pain

## 2017-03-21 NOTE — Progress Notes (Signed)
Patient ID: Misty Patterson, female   DOB: [birth year], 84 y.o.   MRN: [id number]   Location:  [facility] OFFICE  Provider: DR Arletha Grippe  Code Status:  Goals of Care:  Advanced Directives 12/13/2016  Does Patient Have a Medical Advance Directive? Yes  Type of Advance Directive Living will  Does patient want to make changes to medical advance directive? -  Copy of [place] in Chart? -  Would patient like information on creating a medical advance directive? -  Pre-existing out of facility DNR order (yellow form or pink MOST form) -     Chief Complaint  Patient presents with  . Medical Management of Chronic Issues    3 month follow-up, patient states "I have not felt good all week." Patient would like right leg examined (injured 3-4 years ago)   . Medication Refill    Tylenol #3, Memantine  . Medication Management    Discuss discontinuing some medications, patient states this was a topic at last appointment     HPI: Patient is a 84 y.o. female seen today for medical management of chronic diseases.  She is c/a right groin pain that is worse since weather cooler. She rarely takes Tylenol #3. She is a poor historian due to dementia. Hx obtained from chart. 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 Followed by derm Dr Allyson Sabal. Next appt at end of Jan 2019  Anxiety - She takes valium qhs prn  Chronic back/neck pain - due to cervical spinal stenosis, peripheral neuropathy, radiculopathy and RLE. Followed by Ortho Dr Sharol Given and Dr Lynann Bologna. She takes Tylenol #3 prn.  Dementia - stable on namenda xr and aricept without ADRs. MMSE 25/30 (prev 18/30) in Jun 2018. Weight up 6 lbs  Prediabetes - stable. A1c 5.7%  Hx ovarian CA - tx >30 yrs ago but does not recall exact tx. In remission  PAD - has RLE pain. Takes plavix. She is s/p right above-knee popliteal artery stenting in 2015. In June 2017, ABI on right 0.97; left  0.91. Followed by vascular sx Dr Oneida Alar  Hypothyroidism - dx in Oct 2017 by Dr Nolon Rod. She was started on synthroid 50mg daily but does not recall repeat labs and unsure if she is taking medication now; free T4 level 1.2; TSH 2.58  MDD/insomnia - insomnia uncontrolled off trazodone. Mood stable on sertraline  OAB - controlled on vesicare  Hyperlipidemia - diet controlled. LDL 90; TG 248 (prev 325)  COPD - she feels SOB. She rarely uses HFA  CKD - stage 3. Cr 1.4  Past Medical History:  Diagnosis Date  . Anxiety    takes Valium daily  . Cancer ([place]    ovarian  . Cervical disc disease   . Chronic back pain   . Chronic kidney disease   . COPD (ch[place] obstructive pulmonary disease) ([place]    ProAir daily as needed  . CTS (carpal tunnel syndrome)   . Decreased GFR   . Dementia    takes aricept  . Depression    takes Zoloft daily  . DJD (degenerative joint disease) of cervical spine   . DVT (deep venous thrombosis) ([facility])    right leg  . Dyslipidemia    but doesn't take any meds   . Esophageal dysmotility   . GERD (gastroesophageal reflux disease)    takes pepcid daily  . Hypertension    takes Amlodipine and Atenolol daily  . Incontinence  of urine   . Insomnia    takes Trazodone nightly  . Neuropathy   . Nocturia   . Osteoporosis    takes Vit D daily  . Ovarian cancer ([place])   . Peripheral neuropathy   . Peripheral vascular disease ([place])   . Pneumonia    hx of-15+yrs ago  . Radiculopathy   . Restless leg syndrome   . Urinary frequency   . Zenker's diverticulum    small    Past Surgical History:  Procedure Laterality Date  . ABDOMINAL AORTAGRAM  Feb. 20, 2015  . ABDOMINAL AORTAGRAM N/A 05/02/2013   Procedure: ABDOMINAL Maxcine Ham;  Surgeon: Elam Dutch, MD;  Location: [facility] CATH LAB;  Service: Cardiovascular;  Laterality: N/A;  . ABDOMINAL HYSTERECTOMY    . BACK SURGERY    . cataract surgery Bilateral   . COLONOSCOPY    . POSTERIOR CERVICAL  FUSION/FORAMINOTOMY N/A 01/28/2015   Procedure: POSTERIOR CERVICAL DECOMPRESSION FUSION LEVEL 5 WITH INSTRUMENTATION AND ALLOGRAFT;  Surgeon: Phylliss Bob, MD;  Location: [place];  Service: Orthopedics;  Laterality: N/A;  Cervical posterior decompression fusion, cervical 3-4, cervical 4-5, cervical 5-6, cervical 6-7, cervical 6-thoracic 1 with instrumentation and allograft.  . TONSILLECTOMY    . [place]     reports that  has never smoked. she has never used smokeless tobacco. She reports that she does not drink alcohol or use drugs. Social History   Socioeconomic History  . Marital status: Widowed    Spouse name: Not on file  . Number of children: 3  . Years of education: Not on file  . Highest education level: Not on file  Social Needs  . Financial resource strain: Not on file  . Food insecurity - worry: Not on file  . Food insecurity - inability: Not on file  . Transportation needs - medical: Not on file  . Transportation needs - non-medical: Not on file  Occupational History    Employer: RETIRED  Tobacco Use  . Smoking status: Never Smoker  . Smokeless tobacco: Never Used  Substance and Sexual Activity  . Alcohol use: No    Alcohol/week: 0.0 oz  . Drug use: No  . Sexual activity: No    Birth control/protection: Surgical  Other Topics Concern  . Not on file  Social History Narrative   Diet? no      Do you drink/eat things with caffeine?  Coffee, 1 cup      Marital status?    widow                                What year were you married? 17      Do you live in a house, apartment, assisted living, condo, trailer, etc.? House      Is it one or more stories? 1      How many persons live in your home?       Do you have any pets in your home? (please list)  1      Current or past profession: hair dresser      Do you exercise?                yes                      Type & how often? Walk now in house      Do you have a living  will? yes      Do you have a  DNR form?                                  If not, do you want to discuss one?      Do you have signed POA/HPOA for forms?     Family History  Problem Relation Age of Onset  . Hypertension Mother   . Stroke Mother   . Lung cancer Son        Deceased, 46  . Other Father        MVA  . Hypertension Brother   . Stroke Brother   . Kidney disease Sister   . Stroke Brother   . Diabetes Brother        DM  . Stroke Brother   . Cancer Sister        throat  . Healthy Son   . Heart disease Daughter     Allergies  Allergen Reactions  . Aspirin Other (See Comments)    REACTION:  Choking sensation, possible throat swelling  . Formaldehyde Swelling    Tongue swelling  . Sodium Pentobarbital [Pentobarbital] Other (See Comments)    Memory Loss    Outpatient Encounter Medications as of 03/21/2017  Medication Sig  . acetaminophen-codeine (TYLENOL #3) 300-30 MG tablet TAKE 1 TABLET BY MOUTH EVERY 8 HOURS AS NEEDED FOR PAIN  . albuterol (PROAIR HFA) 108 (90 Base) MCG/ACT inhaler Inhale 2 puffs into the lungs every 4 (four) hours as needed for wheezing or shortness of breath.  Marland Kitchen amLODipine (NORVASC) 5 MG tablet Take 5 mg by mouth 2 (two) times daily.   . B Complex-C (B-COMPLEX WITH VITAMIN C) tablet Take 1 tablet by mouth daily. Reported on 05/21/2015  . cholecalciferol (VITAMIN D) 1000 units tablet Take 1,000 Units by mouth daily.  . clopidogrel (PLAVIX) 75 MG tablet Take 75 mg by mouth daily.  . cyclobenzaprine (FLEXERIL) 5 MG tablet Take 5 mg by mouth every 8 (eight) hours as needed for muscle spasms.  . diazepam (VALIUM) 5 MG tablet TAKE 1 TABLET BY MOUTH AT BEDTIME  . donepezil (ARICEPT) 10 MG tablet TAKE 1 TABLET BY MOUTH EVERY NIGHT AT BEDTIME  . gabapentin (NEURONTIN) 600 MG tablet Take 1 tablet (600 mg total) by mouth at bedtime.  Marland Kitchen levothyroxine (SYNTHROID, LEVOTHROID) 25 MCG tablet Take 1 tablet (25 mcg total) by mouth daily before breakfast.  . memantine (NAMENDA XR) 28 MG CP24 24  hr capsule Take 1 capsule (28 mg total) by mouth daily.  . methocarbamol (ROBAXIN) 500 MG tablet Take 500 mg by mouth 2 (two) times daily as needed for muscle spasms.   . Misc Natural Products (GLUCOSAMINE CHOND COMPLEX/MSM PO) Take 1 tablet by mouth daily.  . Misc Natural Products (SINUS FORMULA) TABS Take 1 tablet by mouth daily as needed.  . Multiple Vitamin (MULTIVITAMIN WITH MINERALS) TABS tablet Take 1 tablet by mouth daily.  . Nutritional Supplements (NUTRITIONAL SUPPLEMENT PO) Take 1 capsule by mouth 2 (two) times daily. Lipogen PS Plus  . Omega 3 1000 MG CAPS Take 1,000 mg by mouth daily.  . sertraline (ZOLOFT) 50 MG tablet Take 50 mg by mouth at bedtime.  . sodium chloride (OCEAN) 0.65 % SOLN nasal spray Place 1 spray into both nostrils at bedtime as needed for congestion. Reported on 07/16/2015  . solifenacin (VESICARE) 5 MG tablet Take 5 mg  by mouth daily.  . traZODone (DESYREL) 100 MG tablet Take 1 tablet (100 mg total) by mouth at bedtime.  Marland Kitchen UNABLE TO FIND Med Name: Curell (supplement) Take 1 by mouth daily.   No facility-administered encounter medications on file as of 03/21/2017.     Review of Systems:  Review of Systems  Unable to perform ROS: Dementia    Health Maintenance  Topic Date Due  . TETANUS/TDAP  02/09/2025  . INFLUENZA VACCINE  Completed  . DEXA SCAN  Completed  . PNA vac Low Risk Adult  Completed    Physical Exam: Vitals:   03/21/17 1016  BP: (!) 142/80  Pulse: 63  Temp: (!) 97.5 F (36.4 C)  TempSrc: Oral  SpO2: 97%  Weight: 160 lb 6.4 oz (72.8 kg)  Height: '5\' 5"'  (1.651 m)   Body mass index is 26.69 kg/m. Physical Exam  Constitutional: She is oriented to person, place, and time. She appears well-developed and well-nourished.  HENT:  Mouth/Throat: Oropharynx is clear and moist. No oropharyngeal exudate.  MMM; no oral thrush  Eyes: Pupils are equal, round, and reactive to light. No scleral icterus.  Neck: Neck supple. Carotid bruit is not  present. No tracheal deviation present. No thyromegaly present.  Cardiovascular: Normal rate, regular rhythm and intact distal pulses. Exam reveals no gallop and no friction rub.  Murmur (1/6 SEM) heard. No LE edema b/l. no calf TTP.   Pulmonary/Chest: Effort normal and breath sounds normal. No stridor. No respiratory distress. She has no wheezes. She has no rales.  Abdominal: Soft. Normal appearance and bowel sounds are normal. She exhibits no distension and no mass. There is no hepatomegaly. There is no tenderness. There is no rigidity, no rebound and no guarding. No hernia.  Musculoskeletal: She exhibits edema and tenderness.  Left short leg; right ASIS TTP  Lymphadenopathy:    She has no cervical adenopathy.  Neurological: She is alert and oriented to person, place, and time. Gait abnormal. Abnormal coordination: antalgic.  Skin: Skin is warm and dry. No rash noted.  Psychiatric: She has a normal mood and affect. Her behavior is normal. Judgment and thought content normal.    Labs reviewed: Basic Metabolic Panel: Recent Labs    09/08/16 1235 12/13/16 1236  NA 141 140  K 4.2 3.7  CL 102 99  CO2 25 32  GLUCOSE 114* 109  BUN 29* 24  CREATININE 1.57* 1.40*  CALCIUM 9.5 9.6  TSH 3.83 2.58   Liver Function Tests: Recent Labs    09/08/16 1235 12/13/16 1236  ALT 19 20   No results for input(s): LIPASE, AMYLASE in the last 8760 hours. No results for input(s): AMMONIA in the last 8760 hours. CBC: Recent Labs    12/13/16 1236  WBC 7.1  NEUTROABS 3,536  HGB 14.3  HCT 43.7  MCV 85.9  PLT 253   Lipid Panel: Recent Labs    09/08/16 1235 12/13/16 1236  CHOL 182 180  HDL 56 57  LDLCALC 61  --   TRIG 325* 248*  CHOLHDL 3.3 3.2   Lab Results  Component Value Date   HGBA1C 5.7 (H) 03/15/2016    Procedures since last visit: No results found.  Assessment/Plan   ICD-10-CM   1. Pain in joint involving multiple sites M25.50 acetaminophen-codeine (TYLENOL #3) 300-30  MG tablet    DISCONTINUED: acetaminophen-codeine (TYLENOL #3) 300-30 MG tablet  2. Acquired hypothyroidism E03.9 TSH  3. Dementia without behavioral disturbance, unspecified dementia type F03.90 memantine (NAMENDA XR)  28 MG CP24 24 hr capsule    CMP with eGFR  4. Mixed hyperlipidemia E78.2 Lipid Panel  5. Prediabetes R73.03 Hemoglobin A1c  6. Insomnia, unspecified type G47.00 traZODone (DESYREL) 50 MG tablet   RESTART TRAZODONE 50MG AT BEDTIME TO HELP SLEEP - will be in next pill pak delivery  Continue other medications as ordered  Will call with lab results   Follow up in 3 mos for dementia, insomnia, MDD, chronic back pain     Kodie Pick S. Perlie Gold  [facility] [address] [place], [place] [address] [phone] Cell (Monday-Friday 8 AM - 5 PM) [phone] After 5 PM and follow prompts

## 2017-03-22 LAB — HEMOGLOBIN A1C
EAG (MMOL/L): 6.8 (calc)
Hgb A1c MFr Bld: 5.9 % of total Hgb — ABNORMAL HIGH (ref <5.7)
Mean Plasma Glucose: 123 (calc)

## 2017-03-22 LAB — COMPLETE METABOLIC PANEL WITH GFR
AG Ratio: 1.7 (calc) (ref 1.0–2.5)
ALT: 20 U/L (ref 6–29)
AST: 28 U/L (ref 10–35)
Albumin: 4.3 g/dL (ref 3.6–5.1)
Alkaline phosphatase (APISO): 57 U/L (ref 33–130)
BUN/Creatinine Ratio: 16 (calc) (ref 6–22)
BUN: 21 mg/dL (ref 7–25)
CALCIUM: 9.3 mg/dL (ref 8.6–10.4)
CHLORIDE: 103 mmol/L (ref 98–110)
CO2: 31 mmol/L (ref 20–32)
Creat: 1.31 mg/dL — ABNORMAL HIGH (ref 0.60–0.88)
GFR, EST AFRICAN AMERICAN: 42 mL/min/{1.73_m2} — AB (ref >=60)
GFR, EST NON AFRICAN AMERICAN: 36 mL/min/{1.73_m2} — AB (ref >=60)
GLUCOSE: 135 mg/dL — AB (ref 65–99)
Globulin: 2.6 g/dL (calc) (ref 1.9–3.7)
Potassium: 4.1 mmol/L (ref 3.5–5.3)
Sodium: 142 mmol/L (ref 135–146)
TOTAL PROTEIN: 6.9 g/dL (ref 6.1–8.1)
Total Bilirubin: 0.3 mg/dL (ref 0.2–1.2)

## 2017-03-22 LAB — TSH: TSH: 4.67 mIU/L — ABNORMAL HIGH (ref 0.40–4.50)

## 2017-03-22 LAB — LIPID PANEL
Cholesterol: 214 mg/dL — ABNORMAL HIGH (ref <200)
HDL: 54 mg/dL (ref >50)
LDL CHOLESTEROL (CALC): 116 mg/dL — AB
Non-HDL Cholesterol (Calc): 160 mg/dL (calc) — ABNORMAL HIGH (ref <130)
TRIGLYCERIDES: 288 mg/dL — AB (ref <150)
Total CHOL/HDL Ratio: 4 (calc) (ref <5.0)

## 2017-03-28 ENCOUNTER — Telehealth: Payer: Self-pay

## 2017-03-28 DIAGNOSIS — M79661 Pain in right lower leg: Secondary | ICD-10-CM

## 2017-03-28 DIAGNOSIS — M2550 Pain in unspecified joint: Secondary | ICD-10-CM

## 2017-03-28 DIAGNOSIS — E039 Hypothyroidism, unspecified: Secondary | ICD-10-CM

## 2017-03-28 DIAGNOSIS — M4802 Spinal stenosis, cervical region: Secondary | ICD-10-CM

## 2017-03-28 DIAGNOSIS — M5417 Radiculopathy, lumbosacral region: Secondary | ICD-10-CM

## 2017-03-28 DIAGNOSIS — I739 Peripheral vascular disease, unspecified: Secondary | ICD-10-CM

## 2017-03-28 MED ORDER — LEVOTHYROXINE SODIUM 50 MCG PO TABS: 50.0000 ug | ORAL_TABLET | Freq: Every day | ORAL | 3 refills | Status: DC

## 2017-03-28 NOTE — Telephone Encounter (Signed)
Discussed results with patient, patient verbalized understanding of results.   RX sent to pharmacy for increase dose of Synthroid  Scheduled appointment to recheck labs in 4 weeks, order placed.  Copy of labs and pending lab appointment mailed to patient.   Patient stated she is having a problem with walking and is scared that she may fall. Patient also thinks she would benefit from home health services and is requesting a home health referral that includes PT   Referral order pending, please complete if you agree with patients request

## 2017-03-28 NOTE — Telephone Encounter (Signed)
-----   Message from [place], [place] sent at 03/27/2017  8:42 AM EST ----- Cholesterol elevated so watch fatty foods; reduced kidney function but stable overall; thyroid is underactive - increase levothyroxine 80mcg #30 take 1 tab po daily with 3 RF; repeat TSH in 4 weeks; blood sugar in increasing so watch complex carbs; other labs stable; continue other medications and follow up as scheduled

## 2017-03-29 NOTE — Telephone Encounter (Signed)
done

## 2017-03-30 ENCOUNTER — Other Ambulatory Visit: Payer: Self-pay | Admitting: Internal Medicine

## 2017-04-01 ENCOUNTER — Other Ambulatory Visit: Payer: Self-pay | Admitting: Nurse Practitioner

## 2017-04-01 DIAGNOSIS — M2550 Pain in unspecified joint: Secondary | ICD-10-CM

## 2017-04-02 NOTE — Telephone Encounter (Signed)
Last filled 03/21/17, Im not sure if this is a duplicate message, it came back to the Clinical Intake Pool   I am unable to check your inbasket to see if it came through the first time  Please advise

## 2017-04-10 ENCOUNTER — Other Ambulatory Visit: Payer: Self-pay | Admitting: Internal Medicine

## 2017-04-10 DIAGNOSIS — M2550 Pain in unspecified joint: Secondary | ICD-10-CM

## 2017-04-11 DIAGNOSIS — L905 Scar conditions and fibrosis of skin: Secondary | ICD-10-CM | POA: Diagnosis not present

## 2017-04-11 DIAGNOSIS — L820 Inflamed seborrheic keratosis: Secondary | ICD-10-CM | POA: Diagnosis not present

## 2017-04-11 DIAGNOSIS — L570 Actinic keratosis: Secondary | ICD-10-CM | POA: Diagnosis not present

## 2017-04-11 DIAGNOSIS — Z85828 Personal history of other malignant neoplasm of skin: Secondary | ICD-10-CM | POA: Diagnosis not present

## 2017-04-13 ENCOUNTER — Telehealth: Payer: Self-pay | Admitting: *Deleted

## 2017-04-13 ENCOUNTER — Other Ambulatory Visit: Payer: Self-pay | Admitting: Internal Medicine

## 2017-04-13 DIAGNOSIS — J449 Chronic obstructive pulmonary disease, unspecified: Secondary | ICD-10-CM | POA: Diagnosis not present

## 2017-04-13 DIAGNOSIS — F0390 Unspecified dementia without behavioral disturbance: Secondary | ICD-10-CM | POA: Diagnosis not present

## 2017-04-13 DIAGNOSIS — I739 Peripheral vascular disease, unspecified: Secondary | ICD-10-CM | POA: Diagnosis not present

## 2017-04-13 DIAGNOSIS — M25551 Pain in right hip: Secondary | ICD-10-CM | POA: Diagnosis not present

## 2017-04-13 DIAGNOSIS — M5417 Radiculopathy, lumbosacral region: Secondary | ICD-10-CM | POA: Diagnosis not present

## 2017-04-13 DIAGNOSIS — I129 Hypertensive chronic kidney disease with stage 1 through stage 4 chronic kidney disease, or unspecified chronic kidney disease: Secondary | ICD-10-CM | POA: Diagnosis not present

## 2017-04-13 DIAGNOSIS — Z9181 History of falling: Secondary | ICD-10-CM | POA: Diagnosis not present

## 2017-04-13 DIAGNOSIS — R7303 Prediabetes: Secondary | ICD-10-CM | POA: Diagnosis not present

## 2017-04-13 DIAGNOSIS — F339 Major depressive disorder, recurrent, unspecified: Secondary | ICD-10-CM | POA: Diagnosis not present

## 2017-04-13 DIAGNOSIS — N183 Chronic kidney disease, stage 3 (moderate): Secondary | ICD-10-CM | POA: Diagnosis not present

## 2017-04-13 DIAGNOSIS — M2550 Pain in unspecified joint: Secondary | ICD-10-CM

## 2017-04-13 DIAGNOSIS — G629 Polyneuropathy, unspecified: Secondary | ICD-10-CM | POA: Diagnosis not present

## 2017-04-13 DIAGNOSIS — M4802 Spinal stenosis, cervical region: Secondary | ICD-10-CM | POA: Diagnosis not present

## 2017-04-13 DIAGNOSIS — Z7902 Long term (current) use of antithrombotics/antiplatelets: Secondary | ICD-10-CM | POA: Diagnosis not present

## 2017-04-13 DIAGNOSIS — M1990 Unspecified osteoarthritis, unspecified site: Secondary | ICD-10-CM | POA: Diagnosis not present

## 2017-04-13 NOTE — Telephone Encounter (Signed)
Patient called and stated that she needed a refill on her Tylenol with Codeine. Last refill on 03/21/17. Patient stated that she has had to use alittle more that prescribed and wants to know if you can go ahead and refill it. Please Advise.   Pharmacy confirmed.  Pended Rx and sent to Dr. Eulas Post for approval.

## 2017-04-13 NOTE — Telephone Encounter (Signed)
It is still too early

## 2017-04-13 NOTE — Telephone Encounter (Signed)
Will refill on Friday 04/20/17

## 2017-04-14 ENCOUNTER — Other Ambulatory Visit: Payer: Self-pay | Admitting: Internal Medicine

## 2017-04-14 DIAGNOSIS — M2550 Pain in unspecified joint: Secondary | ICD-10-CM

## 2017-04-25 ENCOUNTER — Other Ambulatory Visit: Payer: PPO

## 2017-04-27 ENCOUNTER — Telehealth: Payer: Self-pay

## 2017-04-27 NOTE — Telephone Encounter (Signed)
Misty Patterson with [facility] called to inform Dr.Carter that pulse is outside of parameters. Current pulse 52, B/P 162/68  Patient is asymptomatic  Misty Patterson also requested verbal orders for RN- Med management, Occupational Therapy, and Education officer, museum.  Per [employer] standing order, verbal order given. Message will be sent to patient's provider as a FYI.

## 2017-04-27 NOTE — Telephone Encounter (Signed)
Noted  

## 2017-05-03 ENCOUNTER — Telehealth: Payer: Self-pay | Admitting: Internal Medicine

## 2017-05-03 NOTE — Telephone Encounter (Signed)
noted 

## 2017-05-03 NOTE — Telephone Encounter (Signed)
Misty Patterson called from [facility] referring to patient's Occupation Therapy being delayed to next week

## 2017-05-04 ENCOUNTER — Telehealth: Payer: Self-pay

## 2017-05-04 NOTE — Telephone Encounter (Signed)
Clinical Social Worker Geni Bers with [facility] called to request verbal orders for social work once weekly for 2 weeks to assist with community resources.  Per [employer] standing order, verbal order given. Message will be sent to patient's provider as a FYI.   Routed to [place], [place], DO

## 2017-05-08 ENCOUNTER — Telehealth: Payer: Self-pay | Admitting: *Deleted

## 2017-05-08 NOTE — Telephone Encounter (Signed)
Misty Patterson with [facility] called and requested verbal order for OT 2X4weeks and 1X1week. Verbal order given.

## 2017-05-11 DIAGNOSIS — R7303 Prediabetes: Secondary | ICD-10-CM | POA: Diagnosis not present

## 2017-05-11 DIAGNOSIS — Z7902 Long term (current) use of antithrombotics/antiplatelets: Secondary | ICD-10-CM | POA: Diagnosis not present

## 2017-05-11 DIAGNOSIS — Z9181 History of falling: Secondary | ICD-10-CM | POA: Diagnosis not present

## 2017-05-11 DIAGNOSIS — M4802 Spinal stenosis, cervical region: Secondary | ICD-10-CM | POA: Diagnosis not present

## 2017-05-11 DIAGNOSIS — N183 Chronic kidney disease, stage 3 (moderate): Secondary | ICD-10-CM | POA: Diagnosis not present

## 2017-05-11 DIAGNOSIS — I129 Hypertensive chronic kidney disease with stage 1 through stage 4 chronic kidney disease, or unspecified chronic kidney disease: Secondary | ICD-10-CM | POA: Diagnosis not present

## 2017-05-11 DIAGNOSIS — M5417 Radiculopathy, lumbosacral region: Secondary | ICD-10-CM | POA: Diagnosis not present

## 2017-05-11 DIAGNOSIS — I739 Peripheral vascular disease, unspecified: Secondary | ICD-10-CM | POA: Diagnosis not present

## 2017-05-11 DIAGNOSIS — G629 Polyneuropathy, unspecified: Secondary | ICD-10-CM | POA: Diagnosis not present

## 2017-05-11 DIAGNOSIS — F339 Major depressive disorder, recurrent, unspecified: Secondary | ICD-10-CM | POA: Diagnosis not present

## 2017-05-11 DIAGNOSIS — M25551 Pain in right hip: Secondary | ICD-10-CM | POA: Diagnosis not present

## 2017-05-11 DIAGNOSIS — F0390 Unspecified dementia without behavioral disturbance: Secondary | ICD-10-CM | POA: Diagnosis not present

## 2017-05-11 DIAGNOSIS — J449 Chronic obstructive pulmonary disease, unspecified: Secondary | ICD-10-CM | POA: Diagnosis not present

## 2017-05-11 DIAGNOSIS — M1990 Unspecified osteoarthritis, unspecified site: Secondary | ICD-10-CM | POA: Diagnosis not present

## 2017-05-14 ENCOUNTER — Other Ambulatory Visit: Payer: Self-pay | Admitting: Internal Medicine

## 2017-05-14 NOTE — Telephone Encounter (Signed)
A medication refill was received from pharmacy for diazepam 5 mg. Rx was called in to pharmacy after verifying last fill date, provider, and quantity on [place] database.   

## 2017-05-18 ENCOUNTER — Telehealth: Payer: Self-pay | Admitting: *Deleted

## 2017-05-18 NOTE — Telephone Encounter (Signed)
[facility] home [facility] calling stating that pt will be discharged from nursing today 05/18/17, pt will still be receiving OT and PT, just a FYI.

## 2017-05-21 ENCOUNTER — Other Ambulatory Visit: Payer: Self-pay | Admitting: Internal Medicine

## 2017-05-27 ENCOUNTER — Other Ambulatory Visit: Payer: Self-pay | Admitting: Internal Medicine

## 2017-05-28 NOTE — Telephone Encounter (Signed)
A refill request was received for metoprolol 25 mg. This medication is not on patient's current medication list. Rx request has been pended to provider for review and approval.

## 2017-06-03 ENCOUNTER — Other Ambulatory Visit: Payer: Self-pay | Admitting: Internal Medicine

## 2017-06-03 DIAGNOSIS — M2550 Pain in unspecified joint: Secondary | ICD-10-CM

## 2017-06-12 ENCOUNTER — Other Ambulatory Visit: Payer: Self-pay | Admitting: Internal Medicine

## 2017-06-12 DIAGNOSIS — M2550 Pain in unspecified joint: Secondary | ICD-10-CM

## 2017-06-19 ENCOUNTER — Encounter: Payer: Self-pay | Admitting: Internal Medicine

## 2017-06-19 ENCOUNTER — Telehealth: Payer: Self-pay

## 2017-06-19 ENCOUNTER — Ambulatory Visit (INDEPENDENT_AMBULATORY_CARE_PROVIDER_SITE_OTHER): Payer: PPO | Admitting: Internal Medicine

## 2017-06-19 VITALS — BP 140/72 | HR 71 | Temp 97.7°F | Ht 65.0 in | Wt 148.0 lb

## 2017-06-19 DIAGNOSIS — R197 Diarrhea, unspecified: Secondary | ICD-10-CM

## 2017-06-19 DIAGNOSIS — L989 Disorder of the skin and subcutaneous tissue, unspecified: Secondary | ICD-10-CM

## 2017-06-19 DIAGNOSIS — E039 Hypothyroidism, unspecified: Secondary | ICD-10-CM | POA: Diagnosis not present

## 2017-06-19 DIAGNOSIS — N3281 Overactive bladder: Secondary | ICD-10-CM

## 2017-06-19 DIAGNOSIS — M2550 Pain in unspecified joint: Secondary | ICD-10-CM | POA: Diagnosis not present

## 2017-06-19 DIAGNOSIS — R7303 Prediabetes: Secondary | ICD-10-CM | POA: Diagnosis not present

## 2017-06-19 DIAGNOSIS — Z79899 Other long term (current) drug therapy: Secondary | ICD-10-CM | POA: Diagnosis not present

## 2017-06-19 DIAGNOSIS — E782 Mixed hyperlipidemia: Secondary | ICD-10-CM

## 2017-06-19 DIAGNOSIS — I739 Peripheral vascular disease, unspecified: Secondary | ICD-10-CM | POA: Diagnosis not present

## 2017-06-19 DIAGNOSIS — J449 Chronic obstructive pulmonary disease, unspecified: Secondary | ICD-10-CM

## 2017-06-19 DIAGNOSIS — F0390 Unspecified dementia without behavioral disturbance: Secondary | ICD-10-CM

## 2017-06-19 LAB — COMPLETE METABOLIC PANEL WITH GFR
AG RATIO: 2 (calc) (ref 1.0–2.5)
ALT: 22 U/L (ref 6–29)
AST: 31 U/L (ref 10–35)
Albumin: 4.5 g/dL (ref 3.6–5.1)
Alkaline phosphatase (APISO): 59 U/L (ref 33–130)
BUN/Creatinine Ratio: 9 (calc) (ref 6–22)
BUN: 10 mg/dL (ref 7–25)
CHLORIDE: 98 mmol/L (ref 98–110)
CO2: 28 mmol/L (ref 20–32)
Calcium: 9.3 mg/dL (ref 8.6–10.4)
Creat: 1.07 mg/dL — ABNORMAL HIGH (ref 0.60–0.88)
GFR, EST NON AFRICAN AMERICAN: 46 mL/min/{1.73_m2} — AB (ref >=60)
GFR, Est African American: 54 mL/min/{1.73_m2} — ABNORMAL LOW (ref >=60)
Globulin: 2.3 g/dL (calc) (ref 1.9–3.7)
Glucose, Bld: 106 mg/dL (ref 65–139)
POTASSIUM: 4.1 mmol/L (ref 3.5–5.3)
Sodium: 134 mmol/L — ABNORMAL LOW (ref 135–146)
Total Bilirubin: 0.6 mg/dL (ref 0.2–1.2)
Total Protein: 6.8 g/dL (ref 6.1–8.1)

## 2017-06-19 LAB — LIPID PANEL
Cholesterol: 192 mg/dL (ref <200)
HDL: 48 mg/dL — AB (ref >50)
LDL Cholesterol (Calc): 115 mg/dL (calc) — ABNORMAL HIGH
NON-HDL CHOLESTEROL (CALC): 144 mg/dL — AB (ref <130)
TRIGLYCERIDES: 176 mg/dL — AB (ref <150)
Total CHOL/HDL Ratio: 4 (calc) (ref <5.0)

## 2017-06-19 LAB — TSH: TSH: 3.28 mIU/L (ref 0.40–4.50)

## 2017-06-19 NOTE — Patient Instructions (Signed)
STOP ARICEPT (DONEPEZIL) DUE TO DIARRHEA  Continue other medications as ordered  Will call with lab results  Will call with derm referral  [place] - she is supposed to be taking plavix daily  Highly recommend higher level of care such as skilled nursing  Follow up in 3 mos for dementia, hyperlipidemia, weight loss

## 2017-06-19 NOTE — Progress Notes (Signed)
Patient ID: Misty Patterson, female   DOB: April 27, 1926, 84 y.o.   MRN: [id number]   Location:  [facility] OFFICE  Provider: DR Arletha Grippe  Code Status:  Goals of Care:  Advanced Directives 06/19/2017  Does Patient Have a Medical Advance Directive? Yes  Type of Paramedic of [place];Living will  Does patient want to make changes to medical advance directive? -  Copy of [place] in Chart? Yes  Would patient like information on creating a medical advance directive? -  Pre-existing out of facility DNR order (yellow form or pink MOST form) -     Chief Complaint  Patient presents with  . Medical Management of Chronic Issues    3 month follow-up on dementia, insomnia, MDD, and chronis back pain. Here with neighbor/friend- Delores Meekins   . Diarrhea    Patient c/o bad diarrhea   . Medication Management    Patient has several pill bottles that contradict medication list   . Advance Care Planning    HPOA/Living Will      HPI: Patient is a 84 y.o. female seen today for medical management of chronic diseases.  She completed HH PT/OT. She reports frequent diarrhea and takes Tums. No bloody stools. Stools are runny but unable to provide any further info. She is a poor historian due to dementia. Hx obtained from chart. Neighbor present today.  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 Followed by derm Dr Allyson Sabal.  Anxiety - stable on valium qhs prn  Chronic back/neck pain - due to cervical spinal stenosis, peripheral neuropathy, radiculopathy and RLE. Controlled on prn Tylenol #3. Followed by Ortho Dr Sharol Given and Dr Lynann Bologna.  Dementia - stable on namenda xr and aricept. She has had diarrhea. MMSE 25/30 (prev 18/30) in Jun 2018. Weight down 12 lbs. She is wandering. Albumin 4.3  Prediabetes - stable. A1c 5.9%  Hx ovarian CA - in remission;  tx >30 yrs ago but does not recall exact tx  PAD - continues to have RLE pain. Not  sure if she is taking plavix. She is s/p right above-knee popliteal artery stenting in 2015. In June 2017, ABI on right 0.97; left 0.91. Followed by vascular sx Dr Oneida Alar  Hypothyroidism - dx in Oct 2017 by Dr Nolon Rod. Takes synthroid but not sure if she is taking it daily; free T4 level 1.2; TSH 4.67  MDD/insomnia - insomnia uncontrolled off trazodone. Mood stable on sertraline  OAB - controlled on vesicare. She has urinary frequency  Hyperlipidemia - diet controlled. LDL 116; TG 288 (prev 248); HDL 54  COPD - continues to have SOB. She rarely uses HFA  CKD - stage 3. Stable. Cr 1.31   Past Medical History:  Diagnosis Date  . Anxiety    takes Valium daily  . Cancer ([place])    ovarian  . Cervical disc disease   . Chronic back pain   . Chronic kidney disease   . COPD (chronic obstructive pulmonary disease) ([place])    ProAir daily as needed  . CTS (carpal tunnel syndrome)   . Decreased GFR   . Dementia    takes aricept  . Depression    takes Zoloft daily  . DJD (degenerative joint disease) of cervical spine   . DVT (deep venous thrombosis) (HCC)    right leg  . Dyslipidemia    but doesn't take any meds   . Esophageal dysmotility   . GERD (gastroesophageal reflux  disease)    takes pepcid daily  . Hypertension    takes Amlodipine and Atenolol daily  . Incontinence of urine   . Insomnia    takes Trazodone nightly  . Neuropathy   . Nocturia   . Osteoporosis    takes Vit D daily  . Ovarian cancer ([place])   . Peripheral neuropathy   . Peripheral vascular disease ([place])   . Pneumonia    hx of-15+yrs ago  . Radiculopathy   . Restless leg syndrome   . Urinary frequency   . Zenker's diverticulum    small    Past Surgical History:  Procedure Laterality Date  . ABDOMINAL AORTAGRAM  Feb. 20, 2015  . ABDOMINAL AORTAGRAM N/A 05/02/2013   Procedure: ABDOMINAL Maxcine Ham;  Surgeon: Elam Dutch, MD;  Location: [facility] C[facility] LAB;  Service: Cardiovascular;  Laterality: N/A;  .  ABDOMINAL HYSTERECTOMY    . BACK SURGERY    . cataract surgery Bilateral   . COLONOSCOPY    . POSTERIOR CERVICAL FUSION/FORAMINOTOMY N/A 01/28/2015   Procedure: POSTERIOR CERVICAL DECOMPRESSION FUSION LEVEL 5 WITH INSTRUMENTATION AND ALLOGRAFT;  Surgeon: Phylliss Bob, MD;  Location: [place];  Service: Orthopedics;  Laterality: N/A;  Cervical posterior decompression fusion, cervical 3-4, cervical 4-5, cervical 5-6, cervical 6-7, cervical 6-thoracic 1 with instrumentation and allograft.  . TONSILLECTOMY    . [place]     reports that she has never smoked. She has never used smokeless tobacco. She reports that she does not drink alcohol or use drugs. Social History   Socioeconomic History  . Marital status: Widowed    Spouse name: Not on file  . Number of children: 3  . Years of education: Not on file  . Highest education level: Not on file  Occupational History    Employer: RETIRED  Social Needs  . Financial resource strain: Not on file  . Food insecurity:    Worry: Not on file    Inability: Not on file  . Transportation needs:    Medical: Not on file    Non-medical: Not on file  Tobacco Use  . Smoking status: Never Smoker  . Smokeless tobacco: Never Used  Substance and Sexual Activity  . Alcohol use: No    Alcohol/week: 0.0 oz  . Drug use: No  . Sexual activity: Never    Birth control/protection: Surgical  Lifestyle  . Physical activity:    Days per week: Not on file    Minutes per session: Not on file  . Stress: Not on file  Relationships  . Social connections:    Talks on phone: Not on file    Gets together: Not on file    Attends religious service: Not on file    Active member of club or organization: Not on file    Attends meetings of clubs or organizations: Not on file    Relationship status: Not on file  . Intimate partner violence:    Fear of current or ex partner: Not on file    Emotionally abused: Not on file    Physically abused: Not on file     Forced sexual activity: Not on file  Other Topics Concern  . Not on file  Social History Narrative   Diet? no      Do you drink/eat things with caffeine?  Coffee, 1 cup      Marital status?    widow  What year were you married? 17      Do you live in a house, apartment, assisted living, condo, trailer, etc.? House      Is it one or more stories? 1      How many persons live in your home?       Do you have any pets in your home? (please list)  1      Current or past profession: hair dresser      Do you exercise?                yes                      Type & how often? Walk now in house      Do you have a living will? yes      Do you have a DNR form?                                  If not, do you want to discuss one?      Do you have signed POA/HPOA for forms?     Family History  Problem Relation Age of Onset  . Hypertension Mother   . Stroke Mother   . Lung cancer Son        Deceased, 30  . Other Father        MVA  . Hypertension Brother   . Stroke Brother   . Kidney disease Sister   . Stroke Brother   . Diabetes Brother        DM  . Stroke Brother   . Cancer Sister        throat  . Healthy Son   . Heart disease Daughter     Allergies  Allergen Reactions  . Aspirin Other (See Comments)    REACTION:  Choking sensation, possible throat swelling  . Formaldehyde Swelling    Tongue swelling  . Sodium Pentobarbital [Pentobarbital] Other (See Comments)    Memory Loss    Outpatient Encounter Medications as of 06/19/2017  Medication Sig  . acetaminophen-codeine (TYLENOL #3) 300-30 MG tablet TAKE 1 TABLET BY MOUTH EVERY 8 HOURS AS NEEDED FOR PAIN  . clopidogrel (PLAVIX) 75 MG tablet Take 75 mg by mouth daily.  . cyclobenzaprine (FLEXERIL) 5 MG tablet Take 5 mg by mouth every 8 (eight) hours as needed for muscle spasms.  . diazepam (VALIUM) 5 MG tablet TAKE 1 TABLET BY MOUTH EVERY NIGHT AT BEDTIME  . donepezil (ARICEPT) 10 MG tablet  TAKE 1 TABLET BY MOUTH EVERY NIGHT AT BEDTIME  . levothyroxine (SYNTHROID, LEVOTHROID) 50 MCG tablet Take 1 tablet (50 mcg total) by mouth daily before breakfast.  . memantine (NAMENDA XR) 28 MG CP24 24 hr capsule Take 1 capsule (28 mg total) by mouth daily.  Marland Kitchen albuterol (PROAIR HFA) 108 (90 Base) MCG/ACT inhaler Inhale 2 puffs into the lungs every 4 (four) hours as needed for wheezing or shortness of breath.  Marland Kitchen amLODipine (NORVASC) 5 MG tablet Take 1 tablet by mouth twice daily.  . B Complex-C (B-COMPLEX WITH VITAMIN C) tablet Take 1 tablet by mouth daily. Reported on 05/21/2015  . cholecalciferol (VITAMIN D) 1000 units tablet Take 1,000 Units by mouth daily.  Marland Kitchen gabapentin (NEURONTIN) 600 MG tablet Take 1 tablet (600 mg total) by mouth at bedtime.  . methocarbamol (ROBAXIN) 500 MG tablet Take 500 mg by  mouth 2 (two) times daily as needed for muscle spasms.   . Misc Natural Products (GLUCOSAMINE CHOND COMPLEX/MSM PO) Take 1 tablet by mouth daily.  . Misc Natural Products (SINUS FORMULA) TABS Take 1 tablet by mouth daily as needed.  . Multiple Vitamin (MULTIVITAMIN WITH MINERALS) TABS tablet Take 1 tablet by mouth daily.  . Nutritional Supplements (NUTRITIONAL SUPPLEMENT PO) Take 1 capsule by mouth 2 (two) times daily. Lipogen PS Plus  . Omega 3 1000 MG CAPS Take 1,000 mg by mouth daily.  . sertraline (ZOLOFT) 50 MG tablet Take 1 tablet by mouth daily.  . sodium chloride (OCEAN) 0.65 % SOLN nasal spray Place 1 spray into both nostrils at bedtime as needed for congestion. Reported on 07/16/2015  . solifenacin (VESICARE) 5 MG tablet Take 5 mg by mouth daily.  . traZODone (DESYREL) 50 MG tablet Take 1 tablet (50 mg total) by mouth at bedtime.  Marland Kitchen UNABLE TO FIND Med Name: Curell (supplement) Take 1 by mouth daily.   No facility-administered encounter medications on file as of 06/19/2017.     Review of Systems:  Review of Systems  Unable to perform ROS: Dementia    Health Maintenance  Topic Date Due   . INFLUENZA VACCINE  10/11/2017  . TETANUS/TDAP  02/09/2025  . DEXA SCAN  Completed  . PNA vac Low Risk Adult  Completed    Physical Exam: Vitals:   06/19/17 1032  BP: 140/72  Pulse: 71  Temp: 97.7 F (36.5 C)  TempSrc: Oral  SpO2: 97%  Weight: 148 lb (67.1 kg)  Height: '5\' 5"'  (1.651 m)   Body mass index is 24.63 kg/m. Physical Exam  Constitutional: She appears well-developed.  Frail appearing in NAD  HENT:  Mouth/Throat: Oropharynx is clear and moist. No oropharyngeal exudate.  MMM; no oral thrush  Eyes: Pupils are equal, round, and reactive to light. No scleral icterus.  Neck: Neck supple. Carotid bruit is not present. No tracheal deviation present. No thyromegaly present.  Cardiovascular: Normal rate, regular rhythm and intact distal pulses. Frequent extrasystoles are present. Exam reveals no gallop and no friction rub.  Murmur (1/6 SEM) heard. +1 pitting LE edema b/l. No calf TTP  Pulmonary/Chest: Effort normal and breath sounds normal. No stridor. No respiratory distress. She has no wheezes. She has no rales. She exhibits no tenderness.  Prolonged expiratory phase  Abdominal: Soft. Normal appearance and bowel sounds are normal. She exhibits no distension and no mass. There is no hepatomegaly. There is no tenderness. There is no rigidity, no rebound and no guarding. No hernia.  Musculoskeletal: She exhibits edema.  Lymphadenopathy:    She has no cervical adenopathy.  Neurological: She is alert.  Skin: Skin is warm and dry. Rash noted.  Right arm irritated skin tag with no secondary signs of infection; patchy scaley skin lesion right temporal area.  Psychiatric: She has a normal mood and affect. Her behavior is normal.    Labs reviewed: Basic Metabolic Panel: Recent Labs    09/08/16 1235 12/13/16 1236 03/21/17 1130  NA 141 140 142  K 4.2 3.7 4.1  CL 102 99 103  CO2 25 32 31  GLUCOSE 114* 109 135*  BUN 29* 24 21  CREATININE 1.57* 1.40* 1.31*  CALCIUM 9.5  9.6 9.3  TSH 3.83 2.58 4.67*   Liver Function Tests: Recent Labs    09/08/16 1235 12/13/16 1236 03/21/17 1130  AST  --   --  28  ALT '19 20 20  ' BILITOT  --   --  0.3  PROT  --   --  6.9   No results for input(s): LIPASE, AMYLASE in the last 8760 hours. No results for input(s): AMMONIA in the last 8760 hours. CBC: Recent Labs    12/13/16 1236  WBC 7.1  NEUTROABS 3,536  HGB 14.3  HCT 43.7  MCV 85.9  PLT 253   Lipid Panel: Recent Labs    09/08/16 1235 12/13/16 1236 03/21/17 1130  CHOL 182 180 214*  HDL 56 57 54  LDLCALC 61 90 116*  TRIG 325* 248* 288*  CHOLHDL 3.3 3.2 4.0   Lab Results  Component Value Date   HGBA1C 5.9 (H) 03/21/2017    Procedures since last visit: No results found.  Assessment/Plan   ICD-10-CM   1. Diarrhea, unspecified type R19.7   2. PAD (peripheral artery disease) (HCC) I73.9   3. Acquired hypothyroidism E03.9 TSH  4. Pain in joint involving multiple sites M25.50   5. Mixed hyperlipidemia E78.2 Lipid Panel  6. Dementia without behavioral disturbance, unspecified dementia type F03.90   7. Chronic obstructive pulmonary disease, unspecified COPD type ([place]) J44.9   8. Prediabetes R73.03   9. OAB (overactive bladder) N32.81   10. High risk medication use Z79.899 CMP with eGFR([facility])  11. Skin lesions L98.9 Ambulatory referral to Dermatology    STOP ARICEPT (DONEPEZIL) DUE TO DIARRHEA  Continue other medications as ordered  Will call with lab results  Will call with derm referral  [place] - she is supposed to be taking plavix daily  Highly recommend higher level of care such as skilled nursing  Follow up in 3 mos for dementia, hyperlipidemia, weight loss     Munirah Doerner S. Perlie Gold  [facility] [address] [place],  [address] [phone] Cell (Monday-Friday 8 AM - 5 PM) [phone] After 5 PM and follow prompts

## 2017-06-19 NOTE — Telephone Encounter (Signed)
Patient was in office today for a 3 month follow-up with her neighbor present, neither was able to verify patient's current medications. Patient had some medication bottles with her, yet half of them had different doses than what is on her current medication list.   Per Dr.Carter call [facility] and have them fax [place] a list or give verbal verification of medications. I left message on voicemail requesting fax with list of all medications that are currently being dispensed through [facility], awaiting fax.

## 2017-06-19 NOTE — Telephone Encounter (Signed)
Pharmacist with [facility] called to confirm fax #, confirmed.  Incoming fax received.  Patient is getting Amlodipine 5 mg, Fish Oil 1000 mg, Gabapentin 600 mg, Garilc Oil 1000 mg(not on medication list) , Glucos/Chond/MSM, One-a-day, Sertraline 50 mg tablet, Super B Complex , Trazodone 50 mg , and Vit D3 1000 IU  Please advise

## 2017-06-20 NOTE — Telephone Encounter (Signed)
D/c meds that status unknown; please send refill for namenda xr; d/c donepezil Thanks

## 2017-06-20 NOTE — Telephone Encounter (Signed)
Please update pt's med list

## 2017-06-20 NOTE — Telephone Encounter (Signed)
Medications marked as taking in this encounter are medications that patient actually had the bottle for at [facility] on 06/19/17 or medications listed on the incoming fax from [facility] [place].  6 medications (some are supplements) still have an unknown status Proventil Robaxin Lipogen PS Plus (nutritional supplement) Saline Nasal Spray Vesicare Curell (Supplement)   I called [facility] to confirm which medications patient gets filled with them Tylenol #3 Diazepam Levothyroxine Memantine XR, last filled 05/02/17 (patient not taking regularly) Donepezil    Please advise if any further action is required

## 2017-06-21 NOTE — Telephone Encounter (Signed)
I updated medication list, printed off a copy and mailed to patient along with letter dated 06/21/17 (refer to letter tab under chart review)   Patient has refills on Namenda XR on file at [facility], rx was last filled approved 03/21/17 #30/6 refills, and rx was last filled by patient in February 2019.   I called patient to confirm who is prescribing Plavix and Flexeril for she had pill bottles when she was here yet they are not on the [facility] list received via incoming fax nor dispensed by [facility], patient was in bed at the time of call and mentioned that she will call back.

## 2017-06-25 ENCOUNTER — Telehealth: Payer: Self-pay

## 2017-06-25 DIAGNOSIS — F0390 Unspecified dementia without behavioral disturbance: Secondary | ICD-10-CM

## 2017-06-25 NOTE — Telephone Encounter (Signed)
She has poor circulation and therefore needs to take plavix to keep blood thinned (see last ov note)

## 2017-06-25 NOTE — Telephone Encounter (Signed)
See phone note dated 06/25/17. Patient walked in to get clarification on her medications.

## 2017-06-25 NOTE — Telephone Encounter (Signed)
Spoke with patient, patient states she takes flexeril as needed, rx was prescribed in the hospital. Patient thinks it was last filled at [facility] in [place] (number provided by patient).  I called [facility], RX never filled there.   Patient states she does not wish to take Plavix and would like an explanation as to why she needs this medication. Patient thought she got this medication through [facility] yet [facility] did not have this on the list they faxed to [place]. Patient asked if I will send rx for Plavix to [facility]   Patient aware that she is receiving a copy of list and she needs to compare.

## 2017-06-25 NOTE — Telephone Encounter (Signed)
Patient walked in office today to get clarification on her medications, patient had her foster daughter with her [place].   1.) Patient has 18 pills of namenda (dispensed in Feb 2019 by [facility]), which would indicate she is not taking reguarly. Patient would probably benefit if this medication came from [facility].  Please advise   2.) Patient also had a empty pill bottle from [facility] on [address] in [place] for Plavix, with a dispense date of 05/20/2015, patient is obviously not taking this medication. Per incoming fax from [facility] they are not filling neither is [facility].  Patient would probably benefit if this medication came from [facility], Please Advise   Patient also had empty Pill Bottles for Diazepam and Gabapentin, last filled at [facility]. Patient is due for these medications. Please advise if ok to fill Diazepam at [facility] since patient takes as needed only (per patient) and Gabapentin at [facility] since its listed as daily at bedtime.   Delia was given a copy of current medication list and a copy of AVS from 06/19/17

## 2017-06-25 NOTE — Telephone Encounter (Signed)
She already gets gabapentin in her pillpak per previous phone note; please add Levothyroxine, Memantine XR, and Donepezil to pillpak if possible.

## 2017-06-26 ENCOUNTER — Other Ambulatory Visit: Payer: Self-pay | Admitting: Family Medicine

## 2017-06-27 MED ORDER — CLOPIDOGREL BISULFATE 75 MG PO TABS: 75.0000 mg | ORAL_TABLET | Freq: Every day | ORAL | 3 refills | Status: DC

## 2017-06-27 MED ORDER — MEMANTINE HCL ER 28 MG PO CP24: 28.0000 mg | ORAL_CAPSULE | Freq: Every day | ORAL | 3 refills | Status: AC

## 2017-06-27 NOTE — Telephone Encounter (Signed)
RX's sent.  Patient was instructed not to take Donepezil.

## 2017-07-05 ENCOUNTER — Other Ambulatory Visit: Payer: Self-pay | Admitting: Internal Medicine

## 2017-07-05 DIAGNOSIS — M2550 Pain in unspecified joint: Secondary | ICD-10-CM

## 2017-07-05 NOTE — Telephone Encounter (Signed)
A medication refill was received from pharmacy for tylenol 3. Rx was pended to provider for approval  after verifying last fill date, provider, and quantity on PMP [place]

## 2017-07-05 NOTE — Telephone Encounter (Signed)
Patient requested [place] Verified LR: 06/04/2017 Pharmacy Confirmed Pended Rx and sent to Dr. Eulas Post for approval.

## 2017-07-13 ENCOUNTER — Telehealth: Payer: Self-pay | Admitting: Internal Medicine

## 2017-07-13 NOTE — Telephone Encounter (Signed)
I called the pt to schedule AWV, but there was no answer and no option to leave a message. VDM (DD) °

## 2017-07-17 ENCOUNTER — Other Ambulatory Visit: Payer: Self-pay | Admitting: Internal Medicine

## 2017-07-17 MED ORDER — DIAZEPAM 10 MG PO TABS: ORAL_TABLET | ORAL | 0 refills | Status: DC

## 2017-07-17 NOTE — Telephone Encounter (Signed)
A medication refill was received from pharmacy for diazepam 5mg . Rx was called in to pharmacy after verifying last fill date, provider, and quantity on PMP [place].    When I called this medication in to the pharmacy, I was informed that the 5 mg was on back order. The pharmacy stated that they would fill the 10 mg and cut them in half for the patient. Rx sent for #15 no refills

## 2017-07-23 ENCOUNTER — Ambulatory Visit: Payer: PPO | Admitting: Nurse Practitioner

## 2017-07-24 ENCOUNTER — Ambulatory Visit (INDEPENDENT_AMBULATORY_CARE_PROVIDER_SITE_OTHER): Payer: PPO | Admitting: Internal Medicine

## 2017-07-24 ENCOUNTER — Encounter: Payer: Self-pay | Admitting: Internal Medicine

## 2017-07-24 ENCOUNTER — Other Ambulatory Visit: Payer: Self-pay | Admitting: Internal Medicine

## 2017-07-24 ENCOUNTER — Ambulatory Visit
Admission: RE | Admit: 2017-07-24 | Discharge: 2017-07-24 | Disposition: A | Discharge status: Home / Self Care | Payer: PPO | Source: Ambulatory Visit | Attending: Internal Medicine | Admitting: Internal Medicine

## 2017-07-24 VITALS — BP 136/72 | HR 65 | Temp 98.4°F | Resp 16 | Ht 65.0 in | Wt 146.2 lb

## 2017-07-24 DIAGNOSIS — F0390 Unspecified dementia without behavioral disturbance: Secondary | ICD-10-CM | POA: Diagnosis not present

## 2017-07-24 DIAGNOSIS — Y92009 Unspecified place in unspecified non-institutional (private) residence as the place of occurrence of the external cause: Secondary | ICD-10-CM

## 2017-07-24 DIAGNOSIS — W19XXXA Unspecified fall, initial encounter: Secondary | ICD-10-CM | POA: Diagnosis not present

## 2017-07-24 DIAGNOSIS — S79912A Unspecified injury of left hip, initial encounter: Secondary | ICD-10-CM | POA: Diagnosis not present

## 2017-07-24 DIAGNOSIS — M25552 Pain in left hip: Secondary | ICD-10-CM

## 2017-07-24 DIAGNOSIS — M545 Low back pain, unspecified: Secondary | ICD-10-CM

## 2017-07-24 DIAGNOSIS — S3992XA Unspecified injury of lower back, initial encounter: Secondary | ICD-10-CM | POA: Diagnosis not present

## 2017-07-24 MED ORDER — CYCLOBENZAPRINE HCL 5 MG PO TABS: 5.0000 mg | ORAL_TABLET | Freq: Three times a day (TID) | ORAL | 2 refills | Status: AC | PRN

## 2017-07-24 NOTE — Progress Notes (Signed)
Patient ID: Misty Patterson, female   DOB: April 07, 1926, 84 y.o.   MRN: [id number]   [facility] OFFICE  Provider: DR Arletha Grippe  Goals of Care:  Advanced Directives 06/19/2017  Does Patient Have a Medical Advance Directive? Yes  Type of Paramedic of [place];Living will  Does patient want to make changes to medical advance directive? -  Copy of [place] in Chart? Yes  Would patient like information on creating a medical advance directive? -  Pre-existing out of facility DNR order (yellow form or pink MOST form) -     Chief Complaint  Patient presents with  . Acute Visit    Patient mentioned that she had a fall 2 weeks ago looking a for a pill bottle. Patient mentioned that before she fell she felt dizzy and then fell.   . Medication Refill    No refills needed at this time    HPI: Patient is a 84 y.o. female seen today for an acute visit for fall. She fell after dizzy spell 2 weeks ago and hit her back. She fell onto her bottom and has had sacral pain ever since. Son did not seek medical attention at the time. She denies numbness/tinglin in legs/feet. No loss of bowel/bladder control. She is a poor historian due to dementia. Hx obtained from chart and daughter.  Past Medical History:  Diagnosis Date  . Anxiety    takes Valium daily  . Cancer ([place])    ovarian  . Cervical disc disease   . Chronic back pain   . Chronic kidney disease   . COPD (chronic obstructive pulmonary disease) ([place])    ProAir daily as needed  . CTS (carpal tunnel syndrome)   . Decreased GFR   . Dementia    takes aricept  . Depression    takes Zoloft daily  . DJD (degenerative joint disease) of cervical spine   . DVT (deep venous thrombosis) (HCC)    right leg  . Dyslipidemia    but doesn't take any meds   . Esophageal dysmotility   . GERD (gastroesophageal reflux disease)    takes pepcid daily  . Hypertension    takes Amlodipine and Atenolol daily  .  Incontinence of urine   . Insomnia    takes Trazodone nightly  . Neuropathy   . Nocturia   . Osteoporosis    takes Vit D daily  . Ovarian cancer ([place])   . Peripheral neuropathy   . Peripheral vascular disease ([place])   . Pneumonia    hx of-15+yrs ago  . Radiculopathy   . Restless leg syndrome   . Urinary frequency   . Zenker's diverticulum    small    Past Surgical History:  Procedure Laterality Date  . ABDOMINAL AORTAGRAM  Feb. 20, 2015  . ABDOMINAL AORTAGRAM N/A 05/02/2013   Procedure: ABDOMINAL Maxcine Ham;  Surgeon: Elam Dutch, MD;  Location: [facility] CATH LAB;  Service: Cardiovascular;  Laterality: N/A;  . ABDOMINAL HYSTERECTOMY    . BACK SURGERY    . cataract surgery Bilateral   . COLONOSCOPY    . POSTERIOR CERVICAL FUSION/FORAMINOTOMY N/A 01/28/2015   Procedure: POSTERIOR CERVICAL DECOMPRESSION FUSION LEVEL 5 WITH INSTRUMENTATION AND ALLOGRAFT;  Surgeon: Phylliss Bob, MD;  Location: [place];  Service: Orthopedics;  Laterality: N/A;  Cervical posterior decompression fusion, cervical 3-4, cervical 4-5, cervical 5-6, cervical 6-7, cervical 6-thoracic 1 with instrumentation and allograft.  . TONSILLECTOMY    . VEIN SURGERY  1950     reports that she has never smoked. She has never used smokeless tobacco. She reports that she does not drink alcohol or use drugs. Social History   Socioeconomic History  . Marital status: Widowed    Spouse name: Not on file  . Number of children: 3  . Years of education: Not on file  . Highest education level: Not on file  Occupational History    Employer: RETIRED  Social Needs  . Financial resource strain: Not on file  . Food insecurity:    Worry: Not on file    Inability: Not on file  . Transportation needs:    Medical: Not on file    Non-medical: Not on file  Tobacco Use  . Smoking status: Never Smoker  . Smokeless tobacco: Never Used  Substance and Sexual Activity  . Alcohol use: No    Alcohol/week: 0.0 oz  . Drug use: No  .  Sexual activity: Never    Birth control/protection: Surgical  Lifestyle  . Physical activity:    Days per week: Not on file    Minutes per session: Not on file  . Stress: Not on file  Relationships  . Social connections:    Talks on phone: Not on file    Gets together: Not on file    Attends religious service: Not on file    Active member of club or organization: Not on file    Attends meetings of clubs or organizations: Not on file    Relationship status: Not on file  . Intimate partner violence:    Fear of current or ex partner: Not on file    Emotionally abused: Not on file    Physically abused: Not on file    Forced sexual activity: Not on file  Other Topics Concern  . Not on file  Social History Narrative   Diet? no      Do you drink/eat things with caffeine?  Coffee, 1 cup      Marital status?    widow                                What year were you married? 17      Do you live in a house, apartment, assisted living, condo, trailer, etc.? House      Is it one or more stories? 1      How many persons live in your home?       Do you have any pets in your home? (please list)  1      Current or past profession: hair dresser      Do you exercise?                yes                      Type & how often? Walk now in house      Do you have a living will? yes      Do you have a DNR form?                                  If not, do you want to discuss one?      Do you have signed POA/HPOA for forms?     Family History  Problem Relation Age of Onset  .  Hypertension Mother   . Stroke Mother   . Lung cancer Son        Deceased, 87  . Other Father        MVA  . Hypertension Brother   . Stroke Brother   . Kidney disease Sister   . Stroke Brother   . Diabetes Brother        DM  . Stroke Brother   . Cancer Sister        throat  . Healthy Son   . Heart disease Daughter     Allergies  Allergen Reactions  . Aspirin Other (See Comments)    REACTION:   Choking sensation, possible throat swelling  . Formaldehyde Swelling    Tongue swelling  . Sodium Pentobarbital [Pentobarbital] Other (See Comments)    Memory Loss    Outpatient Encounter Medications as of 07/24/2017  Medication Sig  . acetaminophen-codeine (TYLENOL #3) 300-30 MG tablet TAKE 1 TABLET BY MOUTH EVERY 8 HOURS AS NEEDED FOR PAIN  . amLODipine (NORVASC) 5 MG tablet Take 1 tablet by mouth twice daily.  . B Complex-C (B-COMPLEX WITH VITAMIN C) tablet Take 1 tablet by mouth daily. Reported on 05/21/2015  . cholecalciferol (VITAMIN D) 1000 units tablet Take 1,000 Units by mouth daily.  . clopidogrel (PLAVIX) 75 MG tablet Take 1 tablet (75 mg total) by mouth daily.  . cyclobenzaprine (FLEXERIL) 5 MG tablet Take 5 mg by mouth every 8 (eight) hours as needed for muscle spasms.  . diazepam (VALIUM) 10 MG tablet Take 1/2 tablet by mouth daily at bedtime.  . gabapentin (NEURONTIN) 600 MG tablet Take 1 tablet (600 mg total) by mouth at bedtime.  Marland Kitchen levothyroxine (SYNTHROID, LEVOTHROID) 50 MCG tablet Take 1 tablet (50 mcg total) by mouth daily before breakfast.  . memantine (NAMENDA XR) 28 MG CP24 24 hr capsule Take 1 capsule (28 mg total) by mouth daily.  . Misc Natural Products (GLUCOSAMINE CHOND COMPLEX/MSM PO) Take 1 tablet by mouth daily.  . Multiple Vitamin (MULTIVITAMIN WITH MINERALS) TABS tablet Take 1 tablet by mouth daily.  . Omega 3 1000 MG CAPS Take 1,000 mg by mouth daily.  . sertraline (ZOLOFT) 50 MG tablet Take 1 tablet by mouth daily.  . traZODone (DESYREL) 50 MG tablet Take 1 tablet (50 mg total) by mouth at bedtime.   No facility-administered encounter medications on file as of 07/24/2017.     Review of Systems:  Review of Systems  Unable to perform ROS: Dementia    Health Maintenance  Topic Date Due  . INFLUENZA VACCINE  10/11/2017  . TETANUS/TDAP  02/09/2025  . DEXA SCAN  Completed  . PNA vac Low Risk Adult  Completed    Physical Exam: Vitals:   07/24/17  1448  BP: 136/72  Pulse: 65  Resp: 16  Temp: 98.4 F (36.9 C)  TempSrc: Oral  SpO2: 97%  Weight: 146 lb 3.2 oz (66.3 kg)  Height: 5\' 5"  (1.651 m)   Body mass index is 24.33 kg/m. Physical Exam  Constitutional: She appears well-developed and well-nourished.    Neck: Neck supple.  Cardiovascular: Normal rate, regular rhythm and intact distal pulses. Exam reveals no gallop and no friction rub.  Murmur (1/6 sem) heard. No LE edema b/l; no calf TTP  Pulmonary/Chest: Effort normal and breath sounds normal. No stridor. No respiratory distress. She has no wheezes. She has no rales. She exhibits no tenderness.  Abdominal: Soft. Bowel sounds are normal. She exhibits distension. She exhibits no  mass. There is tenderness. There is no rebound and no guarding. No hernia.  Obese  Musculoskeletal: She exhibits edema and tenderness.       Lumbar back: She exhibits decreased range of motion, tenderness and spasm.       Back:  Boggy right paravertebral lumbar muscle hypertrophy and ropy tissue texture changes; reduced left hip internal/external rotation with pain noted with ROM; left short leg; neg SLR; no piriformis TTP; gait antalgic  Neurological: She is alert. Gait (antalgic with cane) abnormal.  Skin: Skin is warm and dry. No rash noted.  Psychiatric: She has a normal mood and affect. Her behavior is normal. Thought content normal.    Labs reviewed: Basic Metabolic Panel: Recent Labs    12/13/16 1236 03/21/17 1130 06/19/17 1158  NA 140 142 134*  K 3.7 4.1 4.1  CL 99 103 98  CO2 32 31 28  GLUCOSE 109 135* 106  BUN 24 21 10   CREATININE 1.40* 1.31* 1.07*  CALCIUM 9.6 9.3 9.3  TSH 2.58 4.67* 3.28   Liver Function Tests: Recent Labs    12/13/16 1236 03/21/17 1130 06/19/17 1158  AST  --  28 31  ALT 20 20 22   BILITOT  --  0.3 0.6  PROT  --  6.9 6.8   No results for input(s): LIPASE, AMYLASE in the last 8760 hours. No results for input(s): AMMONIA in the last 8760  hours. CBC: Recent Labs    12/13/16 1236  WBC 7.1  NEUTROABS 3,536  HGB 14.3  HCT 43.7  MCV 85.9  PLT 253   Lipid Panel: Recent Labs    12/13/16 1236 03/21/17 1130 06/19/17 1158  CHOL 180 214* 192  HDL 57 54 48*  LDLCALC 90 116* 115*  TRIG 248* 288* 176*  CHOLHDL 3.2 4.0 4.0   Lab Results  Component Value Date   HGBA1C 5.9 (H) 03/21/2017    Procedures since last visit: No results found.  Assessment/Plan   ICD-10-CM   1. Acute right-sided low back pain without sciatica M54.5 cyclobenzaprine (FLEXERIL) 5 MG tablet    DG Lumbar Spine Complete  2. Left hip pain M25.552 DG Lumbar Spine Complete    CANCELED: DG HIP UNILAT W OR W/O PELVIS MIN 4 VIEWS LEFT  3. Fall in home, initial encounter W19.XXXA DG Lumbar Spine Complete   Y92.009 CANCELED: DG HIP UNILAT W OR W/O PELVIS MIN 4 VIEWS LEFT  4. Dementia without behavioral disturbance, unspecified dementia type F03.90    Will call with xray results  May need Orthopedic eval vs Physical Therapy  START GENERIC FLEXERIL AS NEEDED 3 TIMES DAILY FOR MUSCLE SPASMS  MAY TRY OTC GENERIC CLARITIN, ZYRTEC OR ALLEGRA DAILY FOR SEASONAL ALLERGY  Continue Tylenol #3 for pain  Follow up as scheduled or sooner if need be   Misty Patterson  [facility] [address] [place], [place] [address] [phone] Cell (Monday-Friday 8 AM - 5 PM) [phone] After 5 PM and follow prompts

## 2017-07-24 NOTE — Patient Instructions (Addendum)
Will call with xray results  May need Orthopedic eval vs Physical Therapy  START GENERIC FLEXERIL AS NEEDED 3 TIMES DAILY FOR MUSCLE SPASMS  MAY TRY OTC GENERIC CLARITIN, ZYRTEC OR ALLEGRA DAILY FOR SEASONAL ALLERGY  Continue Tylenol #3 for pain  Follow up as scheduled or sooner if need be

## 2017-07-26 ENCOUNTER — Other Ambulatory Visit: Payer: Self-pay | Admitting: Internal Medicine

## 2017-07-26 ENCOUNTER — Other Ambulatory Visit: Payer: Self-pay

## 2017-07-26 DIAGNOSIS — R937 Abnormal findings on diagnostic imaging of other parts of musculoskeletal system: Secondary | ICD-10-CM

## 2017-07-26 DIAGNOSIS — S22080A Wedge compression fracture of T11-T12 vertebra, initial encounter for closed fracture: Secondary | ICD-10-CM

## 2017-07-30 ENCOUNTER — Telehealth: Payer: Self-pay | Admitting: *Deleted

## 2017-07-30 NOTE — Telephone Encounter (Signed)
Adrienne with Dr. Rudene Anda office called and stated that they have tried twice setting patient up for an appointment Dr. Eulas Post sent them but patient refuses. Stated that she is suppose to be seeing a back Psychologist, sport and exercise,  Dr Lorin Mercy at [facility]. Vincente Liberty stated that she called their office and the soonest they can get her in is 6/14 and patient told them she can't wait that long. Stated patient is very confused about what she is to have done. Wants you to place a referral to [place] if that's what you want for the patient. Please Advise.

## 2017-07-31 ENCOUNTER — Ambulatory Visit (INDEPENDENT_AMBULATORY_CARE_PROVIDER_SITE_OTHER): Payer: PPO | Admitting: Orthopaedic Surgery

## 2017-07-31 NOTE — Telephone Encounter (Signed)
Spoke with patient and she has an appointment 08/02/17 with Dr. Durward Fortes. Patient stated that she is going and has a ride.

## 2017-07-31 NOTE — Telephone Encounter (Signed)
Pt has dementia and this is why she feels confused about the referral. Please encourage her to follow up with Dr Durward Fortes for further recommendations (unless Dr Lorin Mercy specializes in back procedures?). Thanks

## 2017-08-02 ENCOUNTER — Encounter (INDEPENDENT_AMBULATORY_CARE_PROVIDER_SITE_OTHER): Payer: Self-pay | Admitting: Orthopaedic Surgery

## 2017-08-02 ENCOUNTER — Ambulatory Visit (INDEPENDENT_AMBULATORY_CARE_PROVIDER_SITE_OTHER): Payer: PPO | Admitting: Orthopaedic Surgery

## 2017-08-02 ENCOUNTER — Other Ambulatory Visit: Payer: Self-pay | Admitting: Internal Medicine

## 2017-08-02 ENCOUNTER — Other Ambulatory Visit: Payer: Self-pay | Admitting: *Deleted

## 2017-08-02 VITALS — BP 131/73 | HR 73 | Ht 65.5 in | Wt 146.0 lb

## 2017-08-02 DIAGNOSIS — M5441 Lumbago with sciatica, right side: Principal | ICD-10-CM

## 2017-08-02 DIAGNOSIS — M5442 Lumbago with sciatica, left side: Principal | ICD-10-CM

## 2017-08-02 DIAGNOSIS — M2550 Pain in unspecified joint: Secondary | ICD-10-CM

## 2017-08-02 DIAGNOSIS — M545 Low back pain, unspecified: Secondary | ICD-10-CM

## 2017-08-02 DIAGNOSIS — G8929 Other chronic pain: Secondary | ICD-10-CM

## 2017-08-02 NOTE — Progress Notes (Signed)
Office Visit Note   Patient: Misty Patterson           Date of Birth: Jul 31, 1926           MRN: [id number] Visit Date: 08/02/2017              Requested by: Gildardo Cranker, [place], [place] [id number] PCP: Gildardo Cranker, DO   Assessment & Plan: Visit Diagnoses:  1. Acute midline low back pain without sciatica     Plan: Plain films demonstrate what appears to be a compression fracture of T12 of at least 50%.  Will order MRI scan to further evaluate.  Might be a candidate for vertebroplasty  Follow-Up Instructions: Return MRI L-S spine.   Orders:  No orders of the defined types were placed in this encounter.  No orders of the defined types were placed in this encounter.     Procedures: No procedures performed   Clinical Data: No additional findings.   Subjective: Chief Complaint  Patient presents with  . Lower Back - Pain  . New Patient (Initial Visit)    LOW BACK PAIN SINCE FALL 1 MONTH AGO  Misty Patterson is 84 years old and accompanied by her friend.  She is here today for evaluation of back pain.  She apparently fell about 10 days ago landing directly on her buttock.  She thinks she got dizzy and probably "temporarily" passed out.  She further notes that her fall was somewhat controlled landing on her buttock without other injury.  Began to experience back pain was seen at 1 of the local urgent care facilities.  Films of the lumbar spine reveal what may be a compression fracture of T12.  I reviewed these on the PACS system and agree.  She has had a prior L4-5 fusion with instrumentation.  No related numbness or tingling  HPI  Review of Systems  Constitutional: Positive for fatigue.  HENT: Negative for ear pain.   Eyes: Negative for pain.  Respiratory: Positive for shortness of breath. Negative for cough.   Cardiovascular: Positive for leg swelling.  Gastrointestinal: Negative for constipation and diarrhea.  Genitourinary: Positive for difficulty  urinating.  Musculoskeletal: Positive for back pain. Negative for neck pain.  Skin: Negative for rash.  Allergic/Immunologic: Negative for food allergies.  Neurological: Positive for weakness. Negative for numbness.  Hematological: Bruises/bleeds easily.  Psychiatric/Behavioral: Positive for sleep disturbance.     Objective: Vital Signs: BP 131/73 (BP Location: Left Arm, Patient Position: Sitting, Cuff Size: Normal)   Pulse 73   Ht 5' 5.5" (1.664 m)   Wt 146 lb (66.2 kg)   BMI 23.93 kg/m   Physical Exam  Ortho Exam awake alert and oriented x3.  A little forgetful.  Hard of hearing.  No shortness of breath or chest pain.  Pupils equally round with light and accommodation.  Skin is dry.  Straight leg raise negative bilaterally.  Does have some limited range of motion of both hips but no significant pain.  I did review films of her pelvis and she has significant arthritis of both of her hips without acute injury.  Positive percussible tenderness at the thoracolumbar junction.  Pulses intact.  Specialty Comments:  No specialty comments available.  Imaging: No results found.   PMFS History: Patient Active Problem List   Diagnosis Date Noted  . Chronic obstructive pulmonary disease ([place]) 12/13/2016  . Acquired hypothyroidism 12/13/2016  . Recurrent major depressive disorder ([place]) 12/13/2016  . OAB (overactive  bladder) 12/13/2016  . Mixed hyperlipidemia 12/13/2016  . Pain in joint involving multiple sites 05/12/2016  . High risk medication use 05/12/2016  . PAD (peripheral artery disease) ([place]) 05/12/2016  . History of deep vein thrombosis (DVT) of lower extremity 05/12/2016  . Myelopathy ([place]) 01/28/2015  . Chest pain 01/22/2015  . Abnormal ECG 01/22/2015  . Dementia 01/01/2015  . Cervical stenosis of spinal canal 01/01/2015  . Hereditary and idiopathic peripheral neuropathy 01/01/2015  . Radiculopathy 05/06/2014  . Pain of right lower leg 09/04/2013  . Paresthesia of both  hands 09/04/2013  . Aftercare following surgery of the circulatory system, [place] 05/23/2013  . Muscle twitching-Right Leg 05/23/2013  . Peripheral vascular disease ([place]) 04/03/2013   Past Medical History:  Diagnosis Date  . Anxiety    takes Valium daily  . Cancer ([place])    ovarian  . Cervical disc disease   . Chronic back pain   . Chronic kidney disease   . COPD (chronic obstructive pulmonary disease) ([place])    ProAir daily as needed  . CTS (carpal tunnel syndrome)   . Decreased GFR   . Dementia    takes aricept  . Depression    takes Zoloft daily  . DJD (degenerative joint disease) of cervical spine   . DVT (deep venous thrombosis) ([facility])    right leg  . Dyslipidemia    but doesn't take any meds   . Esophageal dysmotility   . GERD (gastroesophageal reflux disease)    takes pepcid daily  . Hypertension    takes Amlodipine and Atenolol daily  . Incontinence of urine   . Insomnia    takes Trazodone nightly  . Neuropathy   . Nocturia   . Osteoporosis    takes Vit D daily  . Ovarian cancer ([place])   . Peripheral neuropathy   . Peripheral vascular disease ([place])   . Pneumonia    hx of-15+yrs ago  . Radiculopathy   . Restless leg syndrome   . Urinary frequency   . Zenker's diverticulum    small    Family History  Problem Relation Age of Onset  . Hypertension Mother   . Stroke Mother   . Lung cancer Son        Deceased, 26  . Other Father        MVA  . Hypertension Brother   . Stroke Brother   . Kidney disease Sister   . Stroke Brother   . Diabetes Brother        DM  . Stroke Brother   . Cancer Sister        throat  . Healthy Son   . Heart disease Daughter     Past Surgical History:  Procedure Laterality Date  . ABDOMINAL AORTAGRAM  Feb. 20, 2015  . ABDOMINAL AORTAGRAM N/A 05/02/2013   Procedure: ABDOMINAL Maxcine Ham;  Surgeon: Elam Dutch, MD;  Location: [facility] CATH LAB;  Service: Cardiovascular;  Laterality: N/A;  . ABDOMINAL HYSTERECTOMY    . BACK  SURGERY    . cataract surgery Bilateral   . COLONOSCOPY    . POSTERIOR CERVICAL FUSION/FORAMINOTOMY N/A 01/28/2015   Procedure: POSTERIOR CERVICAL DECOMPRESSION FUSION LEVEL 5 WITH INSTRUMENTATION AND ALLOGRAFT;  Surgeon: Phylliss Bob, MD;  Location: [facility];  Service: Orthopedics;  Laterality: N/A;  Cervical posterior decompression fusion, cervical 3-4, cervical 4-5, cervical 5-6, cervical 6-7, cervical 6-thoracic 1 with instrumentation and allograft.  . TONSILLECTOMY    . [place]  Social History   Occupational History    Employer: RETIRED  Tobacco Use  . Smoking status: Never Smoker  . Smokeless tobacco: Never Used  Substance and Sexual Activity  . Alcohol use: No    Alcohol/week: 0.0 oz  . Drug use: No  . Sexual activity: Never    Birth control/protection: Surgical

## 2017-08-02 NOTE — Addendum Note (Signed)
Addended by: Deeann Dowse R on: 08/02/2017 01:31 PM   Modules accepted: Miquel Dunn

## 2017-08-03 ENCOUNTER — Ambulatory Visit (INDEPENDENT_AMBULATORY_CARE_PROVIDER_SITE_OTHER): Payer: PPO | Admitting: Orthopaedic Surgery

## 2017-08-03 ENCOUNTER — Telehealth (INDEPENDENT_AMBULATORY_CARE_PROVIDER_SITE_OTHER): Payer: Self-pay | Admitting: Orthopaedic Surgery

## 2017-08-03 ENCOUNTER — Other Ambulatory Visit (INDEPENDENT_AMBULATORY_CARE_PROVIDER_SITE_OTHER): Payer: Self-pay | Admitting: *Deleted

## 2017-08-03 ENCOUNTER — Ambulatory Visit
Admission: RE | Admit: 2017-08-03 | Discharge: 2017-08-03 | Disposition: A | Discharge status: Home / Self Care | Payer: PPO | Source: Ambulatory Visit | Attending: Orthopaedic Surgery | Admitting: Orthopaedic Surgery

## 2017-08-03 DIAGNOSIS — G8929 Other chronic pain: Secondary | ICD-10-CM

## 2017-08-03 DIAGNOSIS — M5441 Lumbago with sciatica, right side: Principal | ICD-10-CM

## 2017-08-03 DIAGNOSIS — M48061 Spinal stenosis, lumbar region without neurogenic claudication: Secondary | ICD-10-CM | POA: Diagnosis not present

## 2017-08-03 DIAGNOSIS — S22088G Other fracture of T11-T12 vertebra, subsequent encounter for fracture with delayed healing: Secondary | ICD-10-CM

## 2017-08-03 DIAGNOSIS — M5442 Lumbago with sciatica, left side: Principal | ICD-10-CM

## 2017-08-03 NOTE — Telephone Encounter (Signed)
Spoke with patient today to schedule her MRI Review appointment.  Patient would prefer to get the results over the phone due to it being difficult to get someone to drive her to the office.

## 2017-08-03 NOTE — Telephone Encounter (Signed)
Last refill 07/05/2017 Database checked and verified

## 2017-08-03 NOTE — Telephone Encounter (Signed)
LMOM for patient and patient's daughter to call office for MRI results, referral placed for Dr. Juliet Rude per Dr. Durward Fortes.

## 2017-08-03 NOTE — Telephone Encounter (Signed)
Please advise 

## 2017-08-07 ENCOUNTER — Telehealth (HOSPITAL_COMMUNITY): Payer: Self-pay

## 2017-08-07 ENCOUNTER — Telehealth (INDEPENDENT_AMBULATORY_CARE_PROVIDER_SITE_OTHER): Payer: Self-pay | Admitting: Orthopaedic Surgery

## 2017-08-07 NOTE — Telephone Encounter (Signed)
PLEASE ADVISE.

## 2017-08-07 NOTE — Telephone Encounter (Signed)
Patient left a voicemail stating she was returning a call to the office regarding her MRI results.

## 2017-08-07 NOTE — Telephone Encounter (Signed)
Called with results of MRI-tried to be as specific as possible. Will schedule appt with Dr Estanislado Pandy at [facility] for methylmethacrylate inj

## 2017-08-07 NOTE — Telephone Encounter (Signed)
Called to schedule consult, no answer, left vm. AW  

## 2017-08-07 NOTE — Telephone Encounter (Signed)
Misty Patterson called results Fri and set up appt with radiology

## 2017-08-16 ENCOUNTER — Other Ambulatory Visit (HOSPITAL_COMMUNITY): Payer: Self-pay | Admitting: Interventional Radiology

## 2017-08-16 DIAGNOSIS — S22080A Wedge compression fracture of T11-T12 vertebra, initial encounter for closed fracture: Secondary | ICD-10-CM

## 2017-08-23 ENCOUNTER — Encounter (HOSPITAL_COMMUNITY): Payer: Self-pay

## 2017-08-23 ENCOUNTER — Ambulatory Visit (HOSPITAL_COMMUNITY)
Admission: RE | Admit: 2017-08-23 | Discharge: 2017-08-23 | Disposition: A | Discharge status: Home / Self Care | Payer: PPO | Source: Ambulatory Visit | Attending: Interventional Radiology | Admitting: Interventional Radiology

## 2017-09-06 ENCOUNTER — Telehealth (HOSPITAL_COMMUNITY): Payer: Self-pay

## 2017-09-06 NOTE — Telephone Encounter (Signed)
Called pt to reschedule. She was a no show for her scheduled consult on 6/13. Pt stated that she did not have a ride at this time. AW

## 2017-09-19 ENCOUNTER — Encounter: Payer: Self-pay | Admitting: Internal Medicine

## 2017-09-19 ENCOUNTER — Ambulatory Visit (INDEPENDENT_AMBULATORY_CARE_PROVIDER_SITE_OTHER): Payer: PPO

## 2017-09-19 ENCOUNTER — Ambulatory Visit (INDEPENDENT_AMBULATORY_CARE_PROVIDER_SITE_OTHER): Payer: PPO | Admitting: Internal Medicine

## 2017-09-19 ENCOUNTER — Telehealth: Payer: Self-pay

## 2017-09-19 VITALS — BP 130/72 | HR 77 | Temp 98.0°F | Resp 20 | Ht 66.0 in | Wt 142.6 lb

## 2017-09-19 VITALS — BP 130/72 | HR 77 | Temp 98.0°F | Ht 66.0 in | Wt 142.0 lb

## 2017-09-19 DIAGNOSIS — Z79899 Other long term (current) drug therapy: Secondary | ICD-10-CM | POA: Diagnosis not present

## 2017-09-19 DIAGNOSIS — F411 Generalized anxiety disorder: Secondary | ICD-10-CM | POA: Diagnosis not present

## 2017-09-19 DIAGNOSIS — M545 Low back pain, unspecified: Secondary | ICD-10-CM

## 2017-09-19 DIAGNOSIS — I739 Peripheral vascular disease, unspecified: Secondary | ICD-10-CM

## 2017-09-19 DIAGNOSIS — F0390 Unspecified dementia without behavioral disturbance: Secondary | ICD-10-CM

## 2017-09-19 DIAGNOSIS — Z0000 Encounter for general adult medical examination without abnormal findings: Secondary | ICD-10-CM

## 2017-09-19 DIAGNOSIS — J449 Chronic obstructive pulmonary disease, unspecified: Secondary | ICD-10-CM

## 2017-09-19 DIAGNOSIS — E039 Hypothyroidism, unspecified: Secondary | ICD-10-CM

## 2017-09-19 DIAGNOSIS — E782 Mixed hyperlipidemia: Secondary | ICD-10-CM | POA: Diagnosis not present

## 2017-09-19 DIAGNOSIS — S22080A Wedge compression fracture of T11-T12 vertebra, initial encounter for closed fracture: Secondary | ICD-10-CM | POA: Diagnosis not present

## 2017-09-19 MED ORDER — ZOSTER VAC RECOMB ADJUVANTED 50 MCG/0.5ML IM SUSR: 0.5000 mL | Freq: Once | INTRAMUSCULAR | 1 refills | Status: AC

## 2017-09-19 MED ORDER — DIAZEPAM 10 MG PO TABS: ORAL_TABLET | ORAL | 0 refills | Status: DC

## 2017-09-19 NOTE — Telephone Encounter (Signed)
I called [facility] at Dr [place] request to see if Dr Durward Fortes would place another referral for patient to be seen at interventional radiology for her back.   IR appointment will need to be scheduled with caregiver- Garth Bigness at [phone].   I left a message asking for a return call from [place]

## 2017-09-19 NOTE — Patient Instructions (Addendum)
CHANGE ALL MEDICATIONS TO MORNING DOSING TO IMPROVE COMPLIANCE  Follow up with Ortho Dr Durward Fortes regarding back fracture. Interventional radiology to call for an appointment  Follow up with other specialists as scheduled  Will call with lab results  Follow up in 3 mos for dementia, compression fx, COPD, depression/anxiety

## 2017-09-19 NOTE — Telephone Encounter (Signed)
I called to reschedule the appointment that patient missed with Dr. Estanislado Pandy on 08/23/17 but I had to leave a message on voicemail for her scheduler, Caryl Pina [phone]) to either call me here at the office or to call Garth Bigness at [phone] to reschedule.

## 2017-09-19 NOTE — Progress Notes (Signed)
Subjective:   Misty Patterson is a 84 y.o. female who presents for Medicare Annual (Subsequent) preventive examination.  Last AWV-09/08/2016    Objective:     Vitals: BP 130/72 (BP Location: Left Arm, Patient Position: Sitting)   Pulse 77   Temp 98 F (36.7 C) (Oral)   Ht 5\' 6"  (1.676 m)   Wt 142 lb (64.4 kg)   SpO2 95%   BMI 22.92 kg/m   Body mass index is 22.92 kg/m.  Advanced Directives 09/19/2017 06/19/2017 12/13/2016 10/12/2016 09/08/2016 09/08/2016 05/12/2016  Does Patient Have a Medical Advance Directive? Yes Yes Yes Yes Yes Yes No  Type of Advance Directive Living will [place];Living will Living will Living will [place];Living will [place];Living will -  Does patient want to make changes to medical advance directive? No - Patient declined - - - No - Patient declined No - Patient declined -  Copy of [place] in Chart? - Yes - - No - copy requested No - copy requested -  Would patient like information on creating a medical advance directive? - - - - - - No - Patient declined  Pre-existing out of facility DNR order (yellow form or pink MOST form) - - - - - - -    Tobacco Social History   Tobacco Use  Smoking Status Never Smoker  Smokeless Tobacco Never Used     Counseling given: Not Answered   Clinical Intake:  Pre-visit preparation completed: No        Nutritional Risks: None Diabetes: No  How often do you need to have someone help you when you read instructions, pamphlets, or other written materials from your doctor or pharmacy?: 1 - Never What is the last grade level you completed in school?: 10th grade  Interpreter Needed?: No  Information entered by :: [name], RN  Past Medical History:  Diagnosis Date  . Anxiety    takes Valium daily  . Cancer ([place])    ovarian  . Cervical disc disease   . Chronic back pain   . Chronic kidney disease   . COPD (chronic obstructive  pulmonary disease) ([place])    ProAir daily as needed  . CTS (carpal tunnel syndrome)   . Decreased GFR   . Dementia    takes aricept  . Depression    takes Zoloft daily  . DJD (degenerative joint disease) of cervical spine   . DVT (deep venous thrombosis) ([facility])    right leg  . Dyslipidemia    but doesn't take any meds   . Esophageal dysmotility   . GERD (gastroesophageal reflux disease)    takes pepcid daily  . Hypertension    takes Amlodipine and Atenolol daily  . Incontinence of urine   . Insomnia    takes Trazodone nightly  . Neuropathy   . Nocturia   . Osteoporosis    takes Vit D daily  . Ovarian cancer ([place])   . Peripheral neuropathy   . Peripheral vascular disease ([place])   . Pneumonia    hx of-15+yrs ago  . Radiculopathy   . Restless leg syndrome   . Urinary frequency   . Zenker's diverticulum    small   Past Surgical History:  Procedure Laterality Date  . ABDOMINAL AORTAGRAM  Feb. 20, 2015  . ABDOMINAL AORTAGRAM N/A 05/02/2013   Procedure: ABDOMINAL Maxcine Ham;  Surgeon: Elam Dutch, MD;  Location: [facility] CATH LAB;  Service:  Cardiovascular;  Laterality: N/A;  . ABDOMINAL HYSTERECTOMY    . BACK SURGERY    . cataract surgery Bilateral   . COLONOSCOPY    . POSTERIOR CERVICAL FUSION/FORAMINOTOMY N/A 01/28/2015   Procedure: POSTERIOR CERVICAL DECOMPRESSION FUSION LEVEL 5 WITH INSTRUMENTATION AND ALLOGRAFT;  Surgeon: Phylliss Bob, MD;  Location: [facility];  Service: Orthopedics;  Laterality: N/A;  Cervical posterior decompression fusion, cervical 3-4, cervical 4-5, cervical 5-6, cervical 6-7, cervical 6-thoracic 1 with instrumentation and allograft.  . TONSILLECTOMY    . VEIN SURGERY  1950   Family History  Problem Relation Age of Onset  . Hypertension Mother   . Stroke Mother   . Lung cancer Son        Deceased, 62  . Other Father        MVA  . Hypertension Brother   . Stroke Brother   . Kidney disease Sister   . Stroke Brother   . Diabetes Brother        DM    . Stroke Brother   . Cancer Sister        throat  . Healthy Son   . Heart disease Daughter    Social History   Socioeconomic History  . Marital status: Widowed    Spouse name: Not on file  . Number of children: 3  . Years of education: Not on file  . Highest education level: Not on file  Occupational History    Employer: RETIRED  Social Needs  . Financial resource strain: Not hard at all  . Food insecurity:    Worry: Never true    Inability: Never true  . Transportation needs:    Medical: No    Non-medical: No  Tobacco Use  . Smoking status: Never Smoker  . Smokeless tobacco: Never Used  Substance and Sexual Activity  . Alcohol use: No    Alcohol/week: 0.0 oz  . Drug use: No  . Sexual activity: Never    Birth control/protection: Surgical  Lifestyle  . Physical activity:    Days per week: 0 days    Minutes per session: 0 min  . Stress: Not at all  Relationships  . Social connections:    Talks on phone: Three times a week    Gets together: Once a week    Attends religious service: Never    Active member of club or organization: No    Attends meetings of clubs or organizations: Never    Relationship status: Widowed  Other Topics Concern  . Not on file  Social History Narrative   Diet? no      Do you drink/eat things with caffeine?  Coffee, 1 cup      Marital status?    widow                                What year were you married? 17      Do you live in a house, apartment, assisted living, condo, trailer, etc.? House      Is it one or more stories? 1      How many persons live in your home?       Do you have any pets in your home? (please list)  1      Current or past profession: hair dresser      Do you exercise?  yes                      Type & how often? Walk now in house      Do you have a living will? yes      Do you have a DNR form?                                  If not, do you want to discuss one?      Do you have signed  POA/HPOA for forms?     Outpatient Encounter Medications as of 09/19/2017  Medication Sig  . acetaminophen-codeine (TYLENOL #3) 300-30 MG tablet TAKE 1 TABLET BY MOUTH EVERY 8 HOURS AS NEEDED FOR PAIN  . amLODipine (NORVASC) 5 MG tablet Take 1 tablet by mouth twice daily.  . B Complex-C (B-COMPLEX WITH VITAMIN C) tablet Take 1 tablet by mouth daily. Reported on 05/21/2015  . cholecalciferol (VITAMIN D) 1000 units tablet Take 1,000 Units by mouth daily.  . cyclobenzaprine (FLEXERIL) 5 MG tablet Take 1 tablet (5 mg total) by mouth 3 (three) times daily as needed for muscle spasms.  . diazepam (VALIUM) 10 MG tablet Take 1/2 tablet by mouth daily at bedtime.  . gabapentin (NEURONTIN) 600 MG tablet Take 1 tablet (600 mg total) by mouth at bedtime.  Marland Kitchen levothyroxine (SYNTHROID, LEVOTHROID) 50 MCG tablet Take 1 tablet (50 mcg total) by mouth daily before breakfast.  . memantine (NAMENDA XR) 28 MG CP24 24 hr capsule Take 1 capsule (28 mg total) by mouth daily.  . Misc Natural Products (GLUCOSAMINE CHOND COMPLEX/MSM PO) Take 1 tablet by mouth daily.  . Multiple Vitamin (MULTIVITAMIN WITH MINERALS) TABS tablet Take 1 tablet by mouth daily.  . Omega 3 1000 MG CAPS Take 1,000 mg by mouth daily.  . sertraline (ZOLOFT) 50 MG tablet Take 1 tablet by mouth daily.  . traZODone (DESYREL) 50 MG tablet Take 1 tablet (50 mg total) by mouth at bedtime.  Marland Kitchen Zoster Vaccine Adjuvanted [facility]) injection Inject 0.5 mLs into the muscle once for 1 dose.  . [DISCONTINUED] Zoster Vaccine Adjuvanted [facility]) injection Inject 0.5 mLs into the muscle once.   No facility-administered encounter medications on file as of 09/19/2017.     Activities of Daily Living In your present state of health, do you have any difficulty performing the following activities: 09/19/2017  Hearing? N  Vision? N  Difficulty concentrating or making decisions? Y  Walking or climbing stairs? N  Dressing or bathing? N  Doing errands, shopping? Y    Preparing Food and eating ? N  Using the Toilet? N  In the past six months, have you accidently leaked urine? Y  Do you have problems with loss of bowel control? N  Managing your Medications? N  Managing your Finances? N  Housekeeping or managing your Housekeeping? N  Some recent data might be hidden    Patient Care Team: Gildardo Cranker, DO as PCP - General (Internal Medicine)    Assessment:   This is a routine wellness examination for [place].  Exercise Activities and Dietary recommendations Current Exercise Habits: The patient does not participate in regular exercise at present, Exercise limited by: neurologic condition(s)  Goals    None      Fall Risk Fall Risk  09/19/2017 06/19/2017 03/21/2017 09/08/2016 09/08/2016  Falls in the past year? Yes No No No No  Comment - - - - -  Number falls in past yr: 2 or more - - - -  Comment - - - - -  Injury with Fall? Yes - - - -  Comment lower R back pain - - - -  Risk for fall due to : - - - - -  Risk for fall due to: Comment - - - - -  Follow up - - - - -   Is the patient's home free of loose throw rugs in walkways, pet beds, electrical cords, etc?   yes      Grab bars in the bathroom? yes      Handrails on the stairs?   yes      Adequate lighting?   yes  Timed Get Up and Go performed: 16 seconds  Depression Screen PHQ 2/9 Scores 09/19/2017 09/08/2016 12/28/2015 12/14/2015  PHQ - 2 Score 0 2 0 0  PHQ- 9 Score - 14 - -     Cognitive Function MMSE - Mini Mental State Exam 09/19/2017 09/08/2016 03/15/2016  Orientation to time 0 5 4  Orientation to Place 5 5 5   Registration 3 3 3   Attention/ Calculation 2 4 0  Recall 2 1 0  Language- name 2 objects 2 2 2   Language- repeat 1 1 1   Language- follow 3 step command 3 2 1   Language- read & follow direction 1 1 1   Write a sentence 1 1 1   Copy design 0 0 0  Total score 20 25 18    Montreal Cognitive Assessment  07/02/2014  Visuospatial/ Executive (0/5) 1  Naming (0/3) 2  Attention:  Read list of digits (0/2) 2  Attention: Read list of letters (0/1) 0  Attention: Serial 7 subtraction starting at 100 (0/3) 0  Language: Repeat phrase (0/2) 2  Language : Fluency (0/1) 0  Abstraction (0/2) 0  Delayed Recall (0/5) 2  Orientation (0/6) 5  Total 14  Adjusted Score (based on education) 15      Immunization History  Administered Date(s) Administered  . Hepatitis A, Adult 12/03/2000  . Influenza,inj,Quad PF,6+ Mos 12/13/2016  . Influenza-Unspecified 12/10/2015  . Pneumococcal Conjugate-13 06/01/2014  . Pneumococcal Polysaccharide-23 10/17/2001  . Td 08/16/2000, 01/03/2012  . Tdap 02/10/2015  . Zoster 12/12/2010    Qualifies for Shingles Vaccine? Yes, educated and ordered pharmacy  Screening Tests Health Maintenance  Topic Date Due  . INFLUENZA VACCINE  10/11/2017  . TETANUS/TDAP  02/09/2025  . DEXA SCAN  Completed  . PNA vac Low Risk Adult  Completed    Cancer Screenings: Lung: Low Dose CT Chest recommended if Age 66-80 years, 30 pack-year currently smoking OR have quit w/in 15years. Patient does not qualify. Breast:  Up to date on Mammogram? Yes   Up to date of Bone Density/Dexa? Yes Colorectal: up to date  Additional Screenings:  Hepatitis C Screening: declined     Plan:    I have personally reviewed and addressed the Medicare Annual Wellness questionnaire and have noted the following in the patient's chart:  A. Medical and social history B. Use of alcohol, tobacco or illicit drugs  C. Current medications and supplements D. Functional ability and status E.  Nutritional status F.  Physical activity G. Advance directives H. List of other physicians I.  Hospitalizations, surgeries, and ER visits in previous 12 months J.  [place] to include hearing, vision, cognitive, depression L. Referrals and appointments - none  In addition, I have reviewed and discussed with patient certain preventive protocols, quality metrics,  and best practice  recommendations. A written personalized care plan for preventive services as well as general preventive health recommendations were provided to patient.  See attached scanned questionnaire for additional information.   Signed,   [name], RN Nurse Health Advisor  Patient Concerns: Low back pain

## 2017-09-19 NOTE — Progress Notes (Signed)
Patient ID: Misty Patterson, female   DOB: 1926-12-25, 84 y.o.   MRN: [id number]   Location:  [facility] OFFICE  Provider: DR Arletha Grippe  Code Status:  Goals of Care:  Advanced Directives 09/19/2017  Does Patient Have a Medical Advance Directive? Yes  Type of Advance Directive Living will  Does patient want to make changes to medical advance directive? No - Patient declined  Copy of [place] in Chart? -  Would patient like information on creating a medical advance directive? -  Pre-existing out of facility DNR order (yellow form or pink MOST form) -     Chief Complaint  Patient presents with  . Medical Management of Chronic Issues    3 mo f/u- dementia, hyperlipedemia, weight loss,     HPI: Patient is a 84 y.o. female seen today for medical management of chronic diseases.  She did not sleep well last night. She wants to sing all night but "I cannot sing". She is a poor historian due to dementia. Hx obtained from chart and neighbor, [employer]. She misses several doses of nighttime meds (trazodone, sertraline and gabapentin) each week. Pt does wander in home at night.  She sustained a moderate T12 compression fx after fall in May 2019. She was seen by Ortho and sent for MRI which revealed moderate T12 compression deformity, burst fx with edema indicating recent injury; 24m retropulsion of superior endplate--> mild-mod canal stenosis and ventral thecal sac effacement wo cord compression; stable L4-S1 lumbar spondylosis. She was referred to IR for injection but missed that appt as she was unaware that she had one.   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 Followed by derm Dr LAllyson Sabal  Anxiety - stable on valium qhs prn  Chronic back/neck pain - due to cervical spinal stenosis, peripheral neuropathy, radiculopathy and RLE. Controlled on prn Tylenol #3. Followed by Ortho Dr DSharol Givenand Dr DLynann Bologna  Dementia - end stage. She takes namenda  xr but unsure if she is receiving any benefit. MMSE today is worse at 15/30 (prev 25/30). Progressively losing weight - down 3 lbs since May 2019 (total 18 lbs since Jan 2019). She is wandering. Albumin 4.5  Prediabetes - stable. A1c 5.9%.   Hx ovarian CA - in remission;  tx >30 yrs ago but does not recall exact tx  PAD - continues to have RLE pain. Not sure if she is taking plavix on a consistent basis. She is s/p right above-knee popliteal artery stenting in 2015. In June 2017, ABI on right 0.97; left 0.91. Followed by vascular sx Dr FOneida Alar Hypothyroidism - dx in Oct 2017 by Dr SNolon Rod Takes synthroid but not sure if she is taking it daily; free T4 level 1.2; TSH 3.28  MDD/insomnia - insomnia uncontrolled off trazodone. Mood stable on sertraline  OAB - controlled on vesicare. She has urinary frequency  Hyperlipidemia - diet controlled. LDL 115; TG 176 (prev 288); HDL 48  COPD - continues to have SOB. She rarely uses HFA  CKD - stage 3. Stable. Cr 1.07  Past Medical History:  Diagnosis Date  . Anxiety    takes Valium daily  . Cancer ([place]    ovarian  . Cervical disc disease   . Chronic back pain   . Chronic kidney disease   . COPD (chronic obstructive pulmonary disease) ([facility]    ProAir daily as needed  . CTS (carpal tunnel syndrome)   . Decreased GFR   .  Dementia    takes aricept  . Depression    takes Zoloft daily  . DJD (degenerative joint disease) of cervical spine   . DVT (deep venous thrombosis) (HCC)    right leg  . Dyslipidemia    but doesn't take any meds   . Esophageal dysmotility   . GERD (gastroesophageal reflux disease)    takes pepcid daily  . Hypertension    takes Amlodipine and Atenolol daily  . Incontinence of urine   . Insomnia    takes Trazodone nightly  . Neuropathy   . Nocturia   . Osteoporosis    takes Vit D daily  . Ovarian cancer ([place])   . Peripheral neuropathy   . Peripheral vascular disease ([place])   . Pneumonia    hx of-15+yrs ago   . Radiculopathy   . Restless leg syndrome   . Urinary frequency   . Zenker's diverticulum    small    Past Surgical History:  Procedure Laterality Date  . ABDOMINAL AORTAGRAM  Feb. 20, 2015  . ABDOMINAL AORTAGRAM N/A 05/02/2013   Procedure: ABDOMINAL Maxcine Ham;  Surgeon: Elam Dutch, MD;  Location: [facility] CATH LAB;  Service: Cardiovascular;  Laterality: N/A;  . ABDOMINAL HYSTERECTOMY    . BACK SURGERY    . cataract surgery Bilateral   . COLONOSCOPY    . POSTERIOR CERVICAL FUSION/FORAMINOTOMY N/A 01/28/2015   Procedure: POSTERIOR CERVICAL DECOMPRESSION FUSION LEVEL 5 WITH INSTRUMENTATION AND ALLOGRAFT;  Surgeon: Phylliss Bob, MD;  Location: [facility];  Service: Orthopedics;  Laterality: N/A;  Cervical posterior decompression fusion, cervical 3-4, cervical 4-5, cervical 5-6, cervical 6-7, cervical 6-thoracic 1 with instrumentation and allograft.  . TONSILLECTOMY    . [place]     reports that she has never smoked. She has never used smokeless tobacco. She reports that she does not drink alcohol or use drugs. Social History   Socioeconomic History  . Marital status: Widowed    Spouse name: Not on file  . Number of children: 3  . Years of education: Not on file  . Highest education level: Not on file  Occupational History    Employer: RETIRED  Social Needs  . Financial resource strain: Not hard at all  . Food insecurity:    Worry: Never true    Inability: Never true  . Transportation needs:    Medical: No    Non-medical: No  Tobacco Use  . Smoking status: Never Smoker  . Smokeless tobacco: Never Used  Substance and Sexual Activity  . Alcohol use: No    Alcohol/week: 0.0 oz  . Drug use: No  . Sexual activity: Never    Birth control/protection: Surgical  Lifestyle  . Physical activity:    Days per week: 0 days    Minutes per session: 0 min  . Stress: Not at all  Relationships  . Social connections:    Talks on phone: Three times a week    Gets together:  Once a week    Attends religious service: Never    Active member of club or organization: No    Attends meetings of clubs or organizations: Never    Relationship status: Widowed  . Intimate partner violence:    Fear of current or ex partner: No    Emotionally abused: No    Physically abused: No    Forced sexual activity: No  Other Topics Concern  . Not on file  Social History Narrative   Diet? no  Do you drink/eat things with caffeine?  Coffee, 1 cup      Marital status?    widow                                What year were you married? 17      Do you live in a house, apartment, assisted living, condo, trailer, etc.? House      Is it one or more stories? 1      How many persons live in your home?       Do you have any pets in your home? (please list)  1      Current or past profession: hair dresser      Do you exercise?                yes                      Type & how often? Walk now in house      Do you have a living will? yes      Do you have a DNR form?                                  If not, do you want to discuss one?      Do you have signed POA/HPOA for forms?     Family History  Problem Relation Age of Onset  . Hypertension Mother   . Stroke Mother   . Lung cancer Son        Deceased, 44  . Other Father        MVA  . Hypertension Brother   . Stroke Brother   . Kidney disease Sister   . Stroke Brother   . Diabetes Brother        DM  . Stroke Brother   . Cancer Sister        throat  . Healthy Son   . Heart disease Daughter     Allergies  Allergen Reactions  . Aspirin Other (See Comments)    REACTION:  Choking sensation, possible throat swelling  . Formaldehyde Swelling    Tongue swelling  . Sodium Pentobarbital [Pentobarbital] Other (See Comments)    Memory Loss    Outpatient Encounter Medications as of 09/19/2017  Medication Sig  . acetaminophen-codeine (TYLENOL #3) 300-30 MG tablet TAKE 1 TABLET BY MOUTH EVERY 8 HOURS AS NEEDED FOR  PAIN  . amLODipine (NORVASC) 5 MG tablet Take 1 tablet by mouth twice daily.  . B Complex-C (B-COMPLEX WITH VITAMIN C) tablet Take 1 tablet by mouth daily. Reported on 05/21/2015  . cholecalciferol (VITAMIN D) 1000 units tablet Take 1,000 Units by mouth daily.  . cyclobenzaprine (FLEXERIL) 5 MG tablet Take 1 tablet (5 mg total) by mouth 3 (three) times daily as needed for muscle spasms.  . diazepam (VALIUM) 10 MG tablet Take 1/2 tablet by mouth daily at bedtime.  . gabapentin (NEURONTIN) 600 MG tablet Take 1 tablet (600 mg total) by mouth at bedtime.  Marland Kitchen levothyroxine (SYNTHROID, LEVOTHROID) 50 MCG tablet Take 1 tablet (50 mcg total) by mouth daily before breakfast.  . memantine (NAMENDA XR) 28 MG CP24 24 hr capsule Take 1 capsule (28 mg total) by mouth daily.  . Misc Natural Products (GLUCOSAMINE CHOND COMPLEX/MSM PO) Take 1 tablet by mouth  daily.  . Multiple Vitamin (MULTIVITAMIN WITH MINERALS) TABS tablet Take 1 tablet by mouth daily.  . Omega 3 1000 MG CAPS Take 1,000 mg by mouth daily.  . sertraline (ZOLOFT) 50 MG tablet Take 1 tablet by mouth daily.  . traZODone (DESYREL) 50 MG tablet Take 1 tablet (50 mg total) by mouth at bedtime.  . [DISCONTINUED] clopidogrel (PLAVIX) 75 MG tablet Take 1 tablet (75 mg total) by mouth daily. (Patient not taking: Reported on 08/02/2017)   No facility-administered encounter medications on file as of 09/19/2017.     Review of Systems:  Review of Systems  Unable to perform ROS: Dementia    Health Maintenance  Topic Date Due  . INFLUENZA VACCINE  10/11/2017  . TETANUS/TDAP  02/09/2025  . DEXA SCAN  Completed  . PNA vac Low Risk Adult  Completed    Physical Exam: Vitals:   09/19/17 1139  BP: 130/72  Pulse: 77  Resp: 20  Temp: 98 F (36.7 C)  TempSrc: Oral  SpO2: 95%  Weight: 142 lb 9.6 oz (64.7 kg)  Height: _0  (1.676 m)   Body mass index is 23.02 kg/m. Physical Exam  Constitutional: She appears well-developed and well-nourished.    HENT:  Mouth/Throat: Oropharynx is clear and moist. No oropharyngeal exudate.  MMM; no oral thrush  Eyes: Pupils are equal, round, and reactive to light. No scleral icterus.  Neck: Neck supple. Carotid bruit is not present. No tracheal deviation present. No thyromegaly present.  Cardiovascular: Normal rate, regular rhythm and intact distal pulses. Exam reveals no gallop and no friction rub.  Murmur (1/6 SEM) heard. No LE edema b/l. no calf TTP.   Pulmonary/Chest: Effort normal. No stridor. No respiratory distress. She has wheezes (end expiratory). She has no rales.  Abdominal: Soft. Normal appearance and bowel sounds are normal. She exhibits no distension and no mass. There is no hepatomegaly. There is no tenderness. There is no rigidity, no rebound and no guarding. No hernia.  Musculoskeletal: She exhibits edema and tenderness.       Thoracic back: She exhibits decreased range of motion, tenderness and spasm.       Back:  Lymphadenopathy:    She has no cervical adenopathy.  Neurological: She is alert. She has normal reflexes.  Skin: Skin is warm and dry. No rash noted.  Psychiatric: She has a normal mood and affect. Her behavior is normal. Thought content normal.    Labs reviewed: Basic Metabolic Panel: Recent Labs    12/13/16 1236 03/21/17 1130 06/19/17 1158  NA 140 142 134*  K 3.7 4.1 4.1  CL 99 103 98  CO2 32 31 28  GLUCOSE 109 135* 106  BUN _1 CREATININE 1.40* 1.31* 1.07*  CALCIUM 9.6 9.3 9.3  TSH 2.58 4.67* 3.28   Liver Function Tests: Recent Labs    12/13/16 1236 03/21/17 1130 06/19/17 1158  AST  --  28 31  ALT _2 BILITOT  --  0.3 0.6  PROT  --  6.9 6.8   No results for input(s): LIPASE, AMYLASE in the last 8760 hours. No results for input(s): AMMONIA in the last 8760 hours. CBC: Recent Labs    12/13/16 1236  WBC 7.1  NEUTROABS 3,536  HGB 14.3  HCT 43.7  MCV 85.9  PLT 253   Lipid Panel: Recent Labs    12/13/16 1236 03/21/17 1130  06/19/17 1158  CHOL 180 214* 192  HDL 57 54 48*  LDLCALC 90  116* 115*  TRIG 248* 288* 176*  CHOLHDL 3.2 4.0 4.0   Lab Results  Component Value Date   HGBA1C 5.9 (H) 03/21/2017    Procedures since last visit: No results found.  Assessment/Plan   ICD-10-CM   1. Compression fracture of twelfth thoracic vertebra (HCC) S22.080A   2. Right-sided low back pain without sciatica, unspecified chronicity M54.5    improving  3. Dementia without behavioral disturbance, unspecified dementia type - worsening with significant wt loss in last 6 mos; MMSE down 10 points F03.90   4. Acquired hypothyroidism E03.9 TSH  5. Mixed hyperlipidemia E78.2   6. Chronic obstructive pulmonary disease, unspecified COPD type ([place]) J44.9   7. PAD (peripheral artery disease) (HCC) I73.9   8. High risk medication use Z79.899 BMP with eGFR([facility])    ALT  9. Anxiety state F41.1 diazepam (VALIUM) 10 MG tablet   CHANGE ALL MEDICATIONS TO MORNING DOSING TO IMPROVE COMPLIANCE  Follow up with Ortho Dr Durward Fortes regarding back fracture. Interventional radiology to call for an appointment  Follow up with other specialists as scheduled  Will call with lab results  T/c palliative care referral due to decline cognitively  Follow up in 3 mos for dementia, compression fx, COPD, depression/anxiety   Jaquasha Carnevale S. Perlie Gold  [facility] [address] [place], [place] [address] [phone] Cell (Monday-Friday 8 AM - 5 PM) [phone] After 5 PM and follow prompts

## 2017-09-19 NOTE — Patient Instructions (Addendum)
Misty Patterson , Thank you for taking time to come for your Medicare Wellness Visit. I appreciate your ongoing commitment to your health goals. Please review the following plan we discussed and let me know if I can assist you in the future.   Screening recommendations/referrals: Colonoscopy excluded, over age 82 Mammogram excluded, over age 41 Bone Density up to date Recommended yearly ophthalmology/optometry visit for glaucoma screening and checkup Recommended yearly dental visit for hygiene and checkup  Vaccinations: Influenza vaccine up to date, due 2019 fall season Pneumococcal vaccine up to date, completed Tdap vaccine up to date, due 02/09/2025 Shingles vaccine due, ordered to pharmacy    Advanced directives: in chart  Conditions/risks identified: none  Next appointment: [name], RN   Preventive Care 65 Years and Older, Female Preventive care refers to lifestyle choices and visits with your health care provider that can promote health and wellness. What does preventive care include?  A yearly physical exam. This is also called an annual well check.  Dental exams once or twice a year.  Routine eye exams. Ask your health care provider how often you should have your eyes checked.  Personal lifestyle choices, including:  Daily care of your teeth and gums.  Regular physical activity.  Eating a healthy diet.  Avoiding tobacco and drug use.  Limiting alcohol use.  Practicing safe sex.  Taking low-dose aspirin every day.  Taking vitamin and mineral supplements as recommended by your health care provider. What happens during an annual well check? The services and screenings done by your health care provider during your annual well check will depend on your age, overall health, lifestyle risk factors, and family history of disease. Counseling  Your health care provider may ask you questions about your:  Alcohol use.  Tobacco use.  Drug use.  Emotional  well-being.  Home and relationship well-being.  Sexual activity.  Eating habits.  History of falls.  Memory and ability to understand (cognition).  Work and work Statistician.  Reproductive health. Screening  You may have the following tests or measurements:  Height, weight, and BMI.  Blood pressure.  Lipid and cholesterol levels. These may be checked every 5 years, or more frequently if you are over 32 years old.  Skin check.  Lung cancer screening. You may have this screening every year starting at age 53 if you have a 30-pack-year history of smoking and currently smoke or have quit within the past 15 years.  Fecal occult blood test (FOBT) of the stool. You may have this test every year starting at age 51.  Flexible sigmoidoscopy or colonoscopy. You may have a sigmoidoscopy every 5 years or a colonoscopy every 10 years starting at age 23.  Hepatitis C blood test.  Hepatitis B blood test.  Sexually transmitted disease (STD) testing.  Diabetes screening. This is done by checking your blood sugar (glucose) after you have not eaten for a while (fasting). You may have this done every 1-3 years.  Bone density scan. This is done to screen for osteoporosis. You may have this done starting at age 58.  Mammogram. This may be done every 1-2 years. Talk to your health care provider about how often you should have regular mammograms. Talk with your health care provider about your test results, treatment options, and if necessary, the need for more tests. Vaccines  Your health care provider may recommend certain vaccines, such as:  Influenza vaccine. This is recommended every year.  Tetanus, diphtheria, and acellular pertussis (Tdap, Td)  vaccine. You may need a Td booster every 10 years.  Zoster vaccine. You may need this after age 42.  Pneumococcal 13-valent conjugate (PCV13) vaccine. One dose is recommended after age 4.  Pneumococcal polysaccharide (PPSV23) vaccine. One  dose is recommended after age 2. Talk to your health care provider about which screenings and vaccines you need and how often you need them. This information is not intended to replace advice given to you by your health care provider. Make sure you discuss any questions you have with your health care provider. Document Released: 03/26/2015 Document Revised: 11/17/2015 Document Reviewed: 12/29/2014 Elsevier Interactive Patient Education  2017 [place] Prevention in the Home Falls can cause injuries. They can happen to people of all ages. There are many things you can do to make your home safe and to help prevent falls. What can I do on the outside of my home?  Regularly fix the edges of walkways and driveways and fix any cracks.  Remove anything that might make you trip as you walk through a door, such as a raised step or threshold.  Trim any bushes or trees on the path to your home.  Use bright outdoor lighting.  Clear any walking paths of anything that might make someone trip, such as rocks or tools.  Regularly check to see if handrails are loose or broken. Make sure that both sides of any steps have handrails.  Any raised decks and porches should have guardrails on the edges.  Have any leaves, snow, or ice cleared regularly.  Use sand or salt on walking paths during winter.  Clean up any spills in your garage right away. This includes oil or grease spills. What can I do in the bathroom?  Use night lights.  Install grab bars by the toilet and in the tub and shower. Do not use towel bars as grab bars.  Use non-skid mats or decals in the tub or shower.  If you need to sit down in the shower, use a plastic, non-slip stool.  Keep the floor dry. Clean up any water that spills on the floor as soon as it happens.  Remove soap buildup in the tub or shower regularly.  Attach bath mats securely with double-sided non-slip rug tape.  Do not have throw rugs and other  things on the floor that can make you trip. What can I do in the bedroom?  Use night lights.  Make sure that you have a light by your bed that is easy to reach.  Do not use any sheets or blankets that are too big for your bed. They should not hang down onto the floor.  Have a firm chair that has side arms. You can use this for support while you get dressed.  Do not have throw rugs and other things on the floor that can make you trip. What can I do in the kitchen?  Clean up any spills right away.  Avoid walking on wet floors.  Keep items that you use a lot in easy-to-reach places.  If you need to reach something above you, use a strong step stool that has a grab bar.  Keep electrical cords out of the way.  Do not use floor polish or wax that makes floors slippery. If you must use wax, use non-skid floor wax.  Do not have throw rugs and other things on the floor that can make you trip. What can I do with my stairs?  Do not leave  any items on the stairs.  Make sure that there are handrails on both sides of the stairs and use them. Fix handrails that are broken or loose. Make sure that handrails are as long as the stairways.  Check any carpeting to make sure that it is firmly attached to the stairs. Fix any carpet that is loose or worn.  Avoid having throw rugs at the top or bottom of the stairs. If you do have throw rugs, attach them to the floor with carpet tape.  Make sure that you have a light switch at the top of the stairs and the bottom of the stairs. If you do not have them, ask someone to add them for you. What else can I do to help prevent falls?  Wear shoes that:  Do not have high heels.  Have rubber bottoms.  Are comfortable and fit you well.  Are closed at the toe. Do not wear sandals.  If you use a stepladder:  Make sure that it is fully opened. Do not climb a closed stepladder.  Make sure that both sides of the stepladder are locked into place.  Ask  someone to hold it for you, if possible.  Clearly mark and make sure that you can see:  Any grab bars or handrails.  First and last steps.  Where the edge of each step is.  Use tools that help you move around (mobility aids) if they are needed. These include:  Canes.  Walkers.  Scooters.  Crutches.  Turn on the lights when you go into a dark area. Replace any light bulbs as soon as they burn out.  Set up your furniture so you have a clear path. Avoid moving your furniture around.  If any of your floors are uneven, fix them.  If there are any pets around you, be aware of where they are.  Review your medicines with your doctor. Some medicines can make you feel dizzy. This can increase your chance of falling. Ask your doctor what other things that you can do to help prevent falls. This information is not intended to replace advice given to you by your health care provider. Make sure you discuss any questions you have with your health care provider. Document Released: 12/24/2008 Document Revised: 08/05/2015 Document Reviewed: 04/03/2014 Elsevier Interactive Patient Education  2017 [employer].

## 2017-09-19 NOTE — Telephone Encounter (Signed)
Misty Patterson called and stated that we could call and schedule her an appointment, if it is closed and they will not reopen it let her know and she will put it in again.

## 2017-09-20 LAB — BASIC METABOLIC PANEL WITH GFR
BUN / CREAT RATIO: 12 (calc) (ref 6–22)
BUN: 15 mg/dL (ref 7–25)
CALCIUM: 9.6 mg/dL (ref 8.6–10.4)
CO2: 32 mmol/L (ref 20–32)
CREATININE: 1.23 mg/dL — AB (ref 0.60–0.88)
Chloride: 102 mmol/L (ref 98–110)
GFR, Est African American: 45 mL/min/{1.73_m2} — ABNORMAL LOW (ref >=60)
GFR, Est Non African American: 39 mL/min/{1.73_m2} — ABNORMAL LOW (ref >=60)
GLUCOSE: 138 mg/dL (ref 65–139)
Potassium: 4.5 mmol/L (ref 3.5–5.3)
Sodium: 140 mmol/L (ref 135–146)

## 2017-09-20 LAB — TSH: TSH: 5.09 mIU/L — ABNORMAL HIGH (ref 0.40–4.50)

## 2017-09-20 LAB — ALT: ALT: 15 U/L (ref 6–29)

## 2017-09-20 NOTE — Telephone Encounter (Signed)
Patient has been scheduled to see Dr. Estanislado Pandy on 10/09/17 at 2 pm.

## 2017-10-01 ENCOUNTER — Other Ambulatory Visit: Payer: Self-pay

## 2017-10-01 ENCOUNTER — Emergency Department (HOSPITAL_COMMUNITY): Payer: PPO

## 2017-10-01 ENCOUNTER — Emergency Department (HOSPITAL_COMMUNITY)
Admission: EM | Admit: 2017-10-01 | Discharge: 2017-10-01 | Disposition: A | Discharge status: Home / Self Care | Payer: PPO | Attending: Emergency Medicine | Admitting: Emergency Medicine

## 2017-10-01 ENCOUNTER — Encounter (HOSPITAL_COMMUNITY): Payer: Self-pay

## 2017-10-01 DIAGNOSIS — Z8543 Personal history of malignant neoplasm of ovary: Secondary | ICD-10-CM | POA: Insufficient documentation

## 2017-10-01 DIAGNOSIS — R4020 Unspecified coma: Secondary | ICD-10-CM | POA: Diagnosis not present

## 2017-10-01 DIAGNOSIS — Y9301 Activity, walking, marching and hiking: Secondary | ICD-10-CM | POA: Insufficient documentation

## 2017-10-01 DIAGNOSIS — F0390 Unspecified dementia without behavioral disturbance: Secondary | ICD-10-CM | POA: Insufficient documentation

## 2017-10-01 DIAGNOSIS — I10 Essential (primary) hypertension: Secondary | ICD-10-CM | POA: Diagnosis not present

## 2017-10-01 DIAGNOSIS — I129 Hypertensive chronic kidney disease with stage 1 through stage 4 chronic kidney disease, or unspecified chronic kidney disease: Secondary | ICD-10-CM | POA: Diagnosis not present

## 2017-10-01 DIAGNOSIS — Z79899 Other long term (current) drug therapy: Secondary | ICD-10-CM | POA: Diagnosis not present

## 2017-10-01 DIAGNOSIS — Y92000 Kitchen of unspecified non-institutional (private) residence as  the place of occurrence of the external cause: Secondary | ICD-10-CM | POA: Diagnosis not present

## 2017-10-01 DIAGNOSIS — Y998 Other external cause status: Secondary | ICD-10-CM | POA: Diagnosis not present

## 2017-10-01 DIAGNOSIS — W010XXA Fall on same level from slipping, tripping and stumbling without subsequent striking against object, initial encounter: Secondary | ICD-10-CM | POA: Insufficient documentation

## 2017-10-01 DIAGNOSIS — R42 Dizziness and giddiness: Secondary | ICD-10-CM | POA: Diagnosis not present

## 2017-10-01 DIAGNOSIS — S199XXA Unspecified injury of neck, initial encounter: Secondary | ICD-10-CM | POA: Diagnosis not present

## 2017-10-01 DIAGNOSIS — S299XXA Unspecified injury of thorax, initial encounter: Secondary | ICD-10-CM | POA: Diagnosis not present

## 2017-10-01 DIAGNOSIS — S0990XA Unspecified injury of head, initial encounter: Secondary | ICD-10-CM | POA: Diagnosis not present

## 2017-10-01 DIAGNOSIS — J449 Chronic obstructive pulmonary disease, unspecified: Secondary | ICD-10-CM | POA: Diagnosis not present

## 2017-10-01 DIAGNOSIS — M545 Low back pain: Secondary | ICD-10-CM | POA: Diagnosis not present

## 2017-10-01 DIAGNOSIS — E039 Hypothyroidism, unspecified: Secondary | ICD-10-CM | POA: Insufficient documentation

## 2017-10-01 DIAGNOSIS — W19XXXA Unspecified fall, initial encounter: Secondary | ICD-10-CM

## 2017-10-01 DIAGNOSIS — R109 Unspecified abdominal pain: Secondary | ICD-10-CM | POA: Diagnosis not present

## 2017-10-01 DIAGNOSIS — S79912A Unspecified injury of left hip, initial encounter: Secondary | ICD-10-CM | POA: Diagnosis not present

## 2017-10-01 DIAGNOSIS — R0789 Other chest pain: Secondary | ICD-10-CM | POA: Diagnosis not present

## 2017-10-01 DIAGNOSIS — M25552 Pain in left hip: Secondary | ICD-10-CM | POA: Insufficient documentation

## 2017-10-01 DIAGNOSIS — R0902 Hypoxemia: Secondary | ICD-10-CM | POA: Diagnosis not present

## 2017-10-01 DIAGNOSIS — N189 Chronic kidney disease, unspecified: Secondary | ICD-10-CM | POA: Diagnosis not present

## 2017-10-01 LAB — URINALYSIS, ROUTINE W REFLEX MICROSCOPIC
BILIRUBIN URINE: NEGATIVE
Glucose, UA: NEGATIVE mg/dL
Hgb urine dipstick: NEGATIVE
KETONES UR: NEGATIVE mg/dL
Leukocytes, UA: NEGATIVE
Nitrite: NEGATIVE
PROTEIN: NEGATIVE mg/dL
Specific Gravity, Urine: 1.008 (ref 1.005–1.030)
pH: 6 (ref 5.0–8.0)

## 2017-10-01 LAB — CBC WITH DIFFERENTIAL/PLATELET
Abs Immature Granulocytes: 0 10*3/uL (ref 0.0–0.1)
Basophils Absolute: 0 10*3/uL (ref 0.0–0.1)
Basophils Relative: 0 %
Eosinophils Absolute: 0 10*3/uL (ref 0.0–0.7)
Eosinophils Relative: 1 %
HCT: 46.7 % — ABNORMAL HIGH (ref 36.0–46.0)
Hemoglobin: 14.5 g/dL (ref 12.0–15.0)
Immature Granulocytes: 0 %
Lymphocytes Relative: 24 %
Lymphs Abs: 1.7 10*3/uL (ref 0.7–4.0)
MCH: 27.1 pg (ref 26.0–34.0)
MCHC: 31 g/dL (ref 30.0–36.0)
MCV: 87.1 fL (ref 78.0–100.0)
Monocytes Absolute: 0.6 10*3/uL (ref 0.1–1.0)
Monocytes Relative: 8 %
Neutro Abs: 5 10*3/uL (ref 1.7–7.7)
Neutrophils Relative %: 67 %
Platelets: 210 10*3/uL (ref 150–400)
RBC: 5.36 MIL/uL — ABNORMAL HIGH (ref 3.87–5.11)
RDW: 15.3 % (ref 11.5–15.5)
WBC: 7.4 10*3/uL (ref 4.0–10.5)

## 2017-10-01 LAB — BASIC METABOLIC PANEL
Anion gap: 10 (ref 5–15)
BUN: 14 mg/dL (ref 8–23)
CO2: 29 mmol/L (ref 22–32)
Calcium: 9.3 mg/dL (ref 8.9–10.3)
Chloride: 104 mmol/L (ref 98–111)
Creatinine, Ser: 1.18 mg/dL — ABNORMAL HIGH (ref 0.44–1.00)
GFR calc Af Amer: 46 mL/min — ABNORMAL LOW (ref >60)
GFR calc non Af Amer: 40 mL/min — ABNORMAL LOW (ref >60)
Glucose, Bld: 108 mg/dL — ABNORMAL HIGH (ref 70–99)
Potassium: 4.6 mmol/L (ref 3.5–5.1)
Sodium: 143 mmol/L (ref 135–145)

## 2017-10-01 MED ORDER — ACETAMINOPHEN 500 MG PO TABS
1000.0000 mg | ORAL_TABLET | Freq: Once | ORAL | Status: AC
  Administered 2017-10-01: 1000 mg via ORAL
  Filled 2017-10-01: qty 2

## 2017-10-01 NOTE — Progress Notes (Signed)
CSW spoke with pt and family at bedside. Pt expressed that pt has been falling a lot in the past. CSW was informed that pt is from home with son where son is able oto care for pt in the evenings after getting off of work but is not able to care for pt during the day. CSW spoke with pt about SNF placement and pt states "NO I wanna be at home". CSW sought further information pt's interest with Home health and pt expressed being agreeable to Home health services at this time. Pt continued to deny wanting SNF placement. CSW has updated RNCM of this desire for home health services. At this time there are no further CSW needs. CSW will sign off.   Misty Patterson. Chrisopher Pustejovsky, MSW, [facility] Clinical Social Worker [phone]

## 2017-10-01 NOTE — ED Notes (Signed)
Patient transported to CT 

## 2017-10-01 NOTE — Care Management Note (Signed)
Case Management Note  Patient Details  Name: [name] MRN: [id number] Date of Birth: Jan 20, 1927  CM consulted for [facility] services. CM spoke with Nils Flack, [place] who placed [facility] orders.  CM spoke with pt's foster daughter via phone in the pt's ED room.  She advised, and asked those in the room, that they have no preference for [facility] agency.  CM contacted Georgina Snell with [name] who accepted pt for services.  No further CM needs noted at this time.  Expected Discharge Date:   10/01/2017               Expected Discharge Plan:  [place]  In-House Referral:  Clinical Social Work  Discharge planning Services  CM Consult  Post Acute Care Choice:  Home Health Choice offered to:  Patient, Adult Children  HH Arranged:  RN, PT, OT, Nurse's Aide, Speech Therapy, Social Work [employer] Agency:  [place]  Status of Service:  Completed, signed off  Kimarie Coor, Benjaman Lobe, RN 10/01/2017, 2:44 PM

## 2017-10-01 NOTE — ED Triage Notes (Signed)
[facility]- pt coming from home, states she became dizzy, fell. No LOC, did not hit her head. She complains of left rib pain and left hip pain. No deformity per EMS. 12 lead unremarkable. Pt has c-collar in place due to mechanism.

## 2017-10-01 NOTE — ED Provider Notes (Signed)
[facility] EMERGENCY DEPARTMENT Provider Note   CSN: [id number] Arrival date & time: 10/01/17  1042     History   Chief Complaint No chief complaint on file.   HPI Misty Patterson is a 84 y.o. female with history of anxiety, CKD, chronic back pain, COPD, dementia, DJD, hypertension, peripheral neuropathy, PVD, and osteoporosis presents today for evaluation of acute onset, intermittent left hip pain secondary to fall earlier today.  She states that she was walking around her kitchen when she began to feel "dizzy "which she describes as feeling "unable to walk".  She states that she thinks that she landed on her buttocks but is unsure.  She does not think that she hit her head or passed out.  She denies any headache or neck pain.  She is currently complaining of pain to the left hip which does not radiate and worsens with bending the left hip.  She denies any back pain, saddle anesthesia, or fevers.  She states that she is incontinent of urine at night but this is chronic and unchanged.  She states that she also fell yesterday when she dropped a can of beans.  Patient's foster daughter states that this actually occurred 4 days ago.  The patient has a history of advanced dementia.  Chart review shows that she was seen and evaluated by her PCP on 09/19/2017 for reevaluation of chronic problems including a moderate T12 compression fracture and L4/S1 lumbar spondylosis.  The history is provided by the patient.    Past Medical History:  Diagnosis Date  . Anxiety    takes Valium daily  . Cancer ([place])    ovarian  . Cervical disc disease   . Chronic back pain   . Chronic kidney disease   . COPD (chronic obstructive pulmonary disease) ([place])    ProAir daily as needed  . CTS (carpal tunnel syndrome)   . Decreased GFR   . Dementia    takes aricept  . Depression    takes Zoloft daily  . DJD (degenerative joint disease) of cervical spine   . DVT (deep venous thrombosis) ([facility])      right leg  . Dyslipidemia    but doesn't take any meds   . Esophageal dysmotility   . GERD (gastroesophageal reflux disease)    takes pepcid daily  . Hypertension    takes Amlodipine and Atenolol daily  . Incontinence of urine   . Insomnia    takes Trazodone nightly  . Neuropathy   . Nocturia   . Osteoporosis    takes Vit D daily  . Ovarian cancer ([place])   . Peripheral neuropathy   . Peripheral vascular disease ([place])   . Pneumonia    hx of-15+yrs ago  . Radiculopathy   . Restless leg syndrome   . Urinary frequency   . Zenker's diverticulum    small    Patient Active Problem List   Diagnosis Date Noted  . Chronic obstructive pulmonary disease ([place]) 12/13/2016  . Acquired hypothyroidism 12/13/2016  . Recurrent major depressive disorder ([place]) 12/13/2016  . OAB (overactive bladder) 12/13/2016  . Mixed hyperlipidemia 12/13/2016  . Pain in joint involving multiple sites 05/12/2016  . High risk medication use 05/12/2016  . PAD (peripheral artery disease) ([place]) 05/12/2016  . History of deep vein thrombosis (DVT) of lower extremity 05/12/2016  . Myelopathy ([place]) 01/28/2015  . Chest pain 01/22/2015  . Abnormal ECG 01/22/2015  . Dementia 01/01/2015  . Cervical stenosis of spinal  canal 01/01/2015  . Hereditary and idiopathic peripheral neuropathy 01/01/2015  . Radiculopathy 05/06/2014  . Pain of right lower leg 09/04/2013  . Paresthesia of both hands 09/04/2013  . Aftercare following surgery of the circulatory system, [place] 05/23/2013  . Muscle twitching-Right Leg 05/23/2013  . Peripheral vascular disease ([place]) 04/03/2013    Past Surgical History:  Procedure Laterality Date  . ABDOMINAL AORTAGRAM  Feb. 20, 2015  . ABDOMINAL AORTAGRAM N/A 05/02/2013   Procedure: ABDOMINAL Maxcine Ham;  Surgeon: Elam Dutch, MD;  Location: [facility] C[facility] LAB;  Service: Cardiovascular;  Laterality: N/A;  . ABDOMINAL HYSTERECTOMY    . BACK SURGERY    . cataract surgery Bilateral   . COLONOSCOPY     . POSTERIOR CERVICAL FUSION/FORAMINOTOMY N/A 01/28/2015   Procedure: POSTERIOR CERVICAL DECOMPRESSION FUSION LEVEL 5 WITH INSTRUMENTATION AND ALLOGRAFT;  Surgeon: Phylliss Bob, MD;  Location: [facility];  Service: Orthopedics;  Laterality: N/A;  Cervical posterior decompression fusion, cervical 3-4, cervical 4-5, cervical 5-6, cervical 6-7, cervical 6-thoracic 1 with instrumentation and allograft.  . TONSILLECTOMY    . VEIN SURGERY  1950     OB History   None      Home Medications    Prior to Admission medications   Medication Sig Start Date End Date Taking? Authorizing Provider  acetaminophen-codeine (TYLENOL #3) 300-30 MG tablet TAKE 1 TABLET BY MOUTH EVERY 8 HOURS AS NEEDED FOR PAIN 08/03/17  Yes Eulas Post, Monica, DO  amLODipine (NORVASC) 5 MG tablet Take 1 tablet by mouth twice daily. 05/28/17  Yes Carter, Monica, DO  B Complex-C (B-COMPLEX WITH VITAMIN C) tablet Take 1 tablet by mouth daily. Reported on 05/21/2015   Yes [provider]  cholecalciferol (VITAMIN D) 1000 units tablet Take 1,000 Units by mouth daily.   Yes [provider]  clopidogrel (PLAVIX) 75 MG tablet Take 75 mg by mouth daily. 09/21/17  Yes [provider]  cyclobenzaprine (FLEXERIL) 5 MG tablet Take 1 tablet (5 mg total) by mouth 3 (three) times daily as needed for muscle spasms. 07/24/17  Yes Eulas Post, Monica, DO  diazepam (VALIUM) 10 MG tablet Take 1/2 tablet by mouth daily at bedtime. 09/19/17  Yes Carter, Monica, DO  donepezil (ARICEPT) 10 MG tablet Take 10 mg by mouth daily. 09/21/17  Yes [provider]  gabapentin (NEURONTIN) 600 MG tablet Take 1 tablet (600 mg total) by mouth at bedtime. 07/17/17  Yes Gildardo Cranker, DO  levothyroxine (SYNTHROID, LEVOTHROID) 50 MCG tablet Take 1 tablet (50 mcg total) by mouth daily before breakfast. 03/28/17  Yes Gildardo Cranker, DO  memantine (NAMENDA XR) 28 MG CP24 24 hr capsule Take 1 capsule (28 mg total) by mouth daily. 06/27/17  Yes Eulas Post, Brayton Layman,  DO  Misc Natural Products (GLUCOSAMINE CHOND COMPLEX/MSM PO) Take 1 tablet by mouth daily.   Yes [provider]  Multiple Vitamin (MULTIVITAMIN WITH MINERALS) TABS tablet Take 1 tablet by mouth daily.   Yes [provider]  Omega 3 1000 MG CAPS Take 1,000 mg by mouth daily.   Yes [provider]  sertraline (ZOLOFT) 50 MG tablet Take 1 tablet by mouth daily. 07/26/17  Yes Gildardo Cranker, DO  traZODone (DESYREL) 50 MG tablet Take 1 tablet (50 mg total) by mouth at bedtime. 03/21/17  Yes Gildardo Cranker, DO    Family History Family History  Problem Relation Age of Onset  . Hypertension Mother   . Stroke Mother   . Lung cancer Son        Deceased,  90  . Other Father        MVA  . Hypertension Brother   . Stroke Brother   . Kidney disease Sister   . Stroke Brother   . Diabetes Brother        DM  . Stroke Brother   . Cancer Sister        throat  . Healthy Son   . Heart disease Daughter     Social History Social History   Tobacco Use  . Smoking status: Never Smoker  . Smokeless tobacco: Never Used  Substance Use Topics  . Alcohol use: No    Alcohol/week: 0.0 oz  . Drug use: No     Allergies   Aspirin; Formaldehyde; and Sodium pentobarbital [pentobarbital]   Review of Systems Review of Systems  Respiratory: Negative for shortness of breath.   Cardiovascular: Negative for chest pain.  Gastrointestinal: Negative for abdominal pain.  Musculoskeletal: Positive for arthralgias. Negative for neck pain.  Neurological: Positive for dizziness. Negative for syncope and numbness.  All other systems reviewed and are negative.    Physical Exam Updated Vital Signs BP (!) 170/90   Pulse 79   Temp 98 F (36.7 C) (Oral)   Resp 14   SpO2 98%   Physical Exam  Constitutional: She appears well-developed and well-nourished. No distress.  HENT:  Head: Normocephalic and atraumatic.  No Battle's signs, no raccoon's eyes, no rhinorrhea. No hemotympanum.  No tenderness to palpation of the face or skull. No deformity, crepitus, or swelling noted.   Eyes: Conjunctivae are normal. Right eye exhibits no discharge. Left eye exhibits no discharge.  Neck: Normal range of motion. Neck supple. No JVD present. No tracheal deviation present.  Cardiovascular: Normal rate and regular rhythm.   2+ radial and PT pulses bilaterally.  1+ DP pulses bilaterally.  No lower extremity edema  Pulmonary/Chest: Effort normal and breath sounds normal.  Equal rise and fall of chest, no increased work of breathing.  Speaking in full sentences without difficulty.  There is tenderness to palpation of the left lateral chest wall with no deformity, crepitus, ecchymosis, or flail segment noted.  Abdominal: Soft. Bowel sounds are normal. She exhibits no distension. There is no tenderness. There is no guarding.  Musculoskeletal: She exhibits no edema.  No midline spine tenderness, no paraspinal muscle tenderness.  No deformity, crepitus, or step-off noted.  There is left SI joint tenderness and tenderness to palpation of the left hip laterally and posteriorly.  Pain is elicited with passive range of motion of the left hip but she has intact active and passive range of motion of the hip.  5/5 strength of BLE major muscle groups.  Neurological: She is alert.  Fluent speech with no evidence of dysarthria or aphasia, no facial droop, sensation intact to soft touch of extremities.  She is alert and oriented to person and time but not place.  She is somewhat confused.  Able to follow most commands.  Skin: Skin is warm and dry. No erythema.  Psychiatric: She has a normal mood and affect. Her behavior is normal.  Nursing note and vitals reviewed.    ED Treatments / Results  Labs (all labs ordered are listed, but only abnormal results are displayed) Labs Reviewed  BASIC METABOLIC PANEL - Abnormal; Notable for the following components:      Result Value   Glucose, Bld 108 (*)     Creatinine, Ser 1.18 (*)    GFR calc non Af Amer 40 (*)  GFR calc Af Amer 46 (*)    All other components within normal limits  CBC WITH DIFFERENTIAL/PLATELET - Abnormal; Notable for the following components:   RBC 5.36 (*)    HCT 46.7 (*)    All other components within normal limits  URINALYSIS, ROUTINE W REFLEX MICROSCOPIC - Abnormal; Notable for the following components:   APPearance HAZY (*)    All other components within normal limits    EKG EKG Interpretation  Date/Time:  Monday October 01 2017 11:00:04 EDT Ventricular Rate:  69 PR Interval:    QRS Duration: 82 QT Interval:  326 QTC Calculation: 350 R Axis:   15 Text Interpretation:  Sinus rhythm Borderline T wave abnormalities No significant change since last tracing Confirmed by Duffy Bruce [phone]) on 10/01/2017 1:10:57 PM   Radiology Dg Ribs Unilateral W/chest Left  Result Date: 10/01/2017 CLINICAL DATA:  Recent fall with left-sided chest pain, initial encounter EXAM: LEFT RIBS AND CHEST - 3+ VIEW COMPARISON:  02/12/2015 FINDINGS: Postsurgical changes are noted in the cervical spine. The cardiac shadow is within normal limits. Aortic calcifications are seen without aneurysmal dilatation. Old rib fractures are noted on the left posteriorly. No pneumothorax is seen. No acute rib fractures are identified. IMPRESSION: No evidence of acute rib fracture. Chronic rib fractures are noted on the left. Electronically Signed   By: Inez Catalina M.D.   On: 10/01/2017 12:34   Dg Lumbar Spine Complete  Result Date: 10/01/2017 CLINICAL DATA:  Recent fall with low back pain, initial encounter EXAM: LUMBAR SPINE - COMPLETE 4+ VIEW COMPARISON:  08/03/2017 FINDINGS: T12 compression deformity is again identified and stable. 5 lumbar type vertebral bodies are well visualized. Changes of prior fusion at L4-5 are noted. No acute compression deformity is noted. No significant anterolisthesis is seen. No other focal abnormality is noted. IMPRESSION:  Stable T12 compression deformity.  No acute abnormality noted. Postsurgical changes at L4-5. Electronically Signed   By: Inez Catalina M.D.   On: 10/01/2017 12:27   Ct Head Wo Contrast  Result Date: 10/01/2017 CLINICAL DATA:  Dizziness with fall. Left hip pain. Initial encounter. EXAM: CT HEAD WITHOUT CONTRAST CT CERVICAL SPINE WITHOUT CONTRAST TECHNIQUE: Multidetector CT imaging of the head and cervical spine was performed following the standard protocol without intravenous contrast. Multiplanar CT image reconstructions of the cervical spine were also generated. COMPARISON:  02/10/2015 FINDINGS: CT HEAD FINDINGS Brain: No evidence of acute infarction, hemorrhage, hydrocephalus, extra-axial collection or mass lesion/mass effect. Confluent low-density in the cerebral white matter with indistinct deep gray nuclei, stable. Generalized atrophy Vascular: Atherosclerotic calcification. Skull: Negative for fracture Sinuses/Orbits: No evidence of injury.  Bilateral cataract resection CT CERVICAL SPINE FINDINGS Alignment: Normal Skull base and vertebrae: Negative for acute fracture. Remote T2 superior endplate fracture. Posterior-lateral fusion with hardware from C3-T1. There is solid arthrodesis throughout. Laminectomy at C4-C6. Soft tissues and spinal canal: No prevertebral fluid or swelling. No visible canal hematoma. Bulky atherosclerotic calcification in the carotids. Incidental prominent pyramidal lobe of the thyroid. Disc levels: Advanced disc degeneration at T1-2 attributed to adjacent segment disease and prior trauma. Atlantodental degeneration with partially calcified ligamentous thickening. No cervicomedullary impingement Upper chest: No evidence of injury IMPRESSION: 1. No evidence of acute intracranial or cervical spine injury. 2. Advanced chronic small vessel ischemia. 3. C3-T1 posterior fusion with solid arthrodesis. Advanced T1-2 adjacent segment disc degeneration. Electronically Signed   By: Monte Fantasia M.D.   On: 10/01/2017 12:09   Ct Cervical Spine Wo Contrast  Result Date: 10/01/2017 CLINICAL DATA:  Dizziness with fall. Left hip pain. Initial encounter. EXAM: CT HEAD WITHOUT CONTRAST CT CERVICAL SPINE WITHOUT CONTRAST TECHNIQUE: Multidetector CT imaging of the head and cervical spine was performed following the standard protocol without intravenous contrast. Multiplanar CT image reconstructions of the cervical spine were also generated. COMPARISON:  02/10/2015 FINDINGS: CT HEAD FINDINGS Brain: No evidence of acute infarction, hemorrhage, hydrocephalus, extra-axial collection or mass lesion/mass effect. Confluent low-density in the cerebral white matter with indistinct deep gray nuclei, stable. Generalized atrophy Vascular: Atherosclerotic calcification. Skull: Negative for fracture Sinuses/Orbits: No evidence of injury.  Bilateral cataract resection CT CERVICAL SPINE FINDINGS Alignment: Normal Skull base and vertebrae: Negative for acute fracture. Remote T2 superior endplate fracture. Posterior-lateral fusion with hardware from C3-T1. There is solid arthrodesis throughout. Laminectomy at C4-C6. Soft tissues and spinal canal: No prevertebral fluid or swelling. No visible canal hematoma. Bulky atherosclerotic calcification in the carotids. Incidental prominent pyramidal lobe of the thyroid. Disc levels: Advanced disc degeneration at T1-2 attributed to adjacent segment disease and prior trauma. Atlantodental degeneration with partially calcified ligamentous thickening. No cervicomedullary impingement Upper chest: No evidence of injury IMPRESSION: 1. No evidence of acute intracranial or cervical spine injury. 2. Advanced chronic small vessel ischemia. 3. C3-T1 posterior fusion with solid arthrodesis. Advanced T1-2 adjacent segment disc degeneration. Electronically Signed   By: Monte Fantasia M.D.   On: 10/01/2017 12:09   Dg Hip Unilat With Pelvis 2-3 Views Left  Result Date: 10/01/2017 CLINICAL DATA:   Recent fall with left hip pain, initial encounter EXAM: DG HIP (WITH OR WITHOUT PELVIS) 2-3V LEFT COMPARISON:  07/24/2017 FINDINGS: There are changes consistent with prior pubic rami fractures with healing on the right. Bilateral hip degenerative changes are seen. No acute fracture or dislocation is noted. No gross soft tissue abnormality is seen. IMPRESSION: Chronic changes without acute abnormality. Electronically Signed   By: Inez Catalina M.D.   On: 10/01/2017 12:30    Procedures Procedures (including critical care time)  Medications Ordered in ED Medications  acetaminophen (TYLENOL) tablet 1,000 mg (1,000 mg Oral Given 10/01/17 1331)     Initial Impression / Assessment and Plan / ED Course  I have reviewed the triage vital signs and the nursing notes.  Pertinent labs & imaging results that were available during my care of the patient were reviewed by me and considered in my medical decision making (see chart for details).     Patient with history of dementia presents for evaluation after multiple falls.  It appears she has been falling more frequently.  Some of the falls are mechanical in nature but it appears that she at times becomes "dizzy ".  She has difficulty describing with the sensation means to her.  She is afebrile, hypertensive while in the ED but has not had any of her home medications including her blood pressure medicines.  No focal neurologic deficits.  No evidence of basilar skull fracture or head bleed.  She is also complaining of left hip pain and left chest wall pain.  No midline spine tenderness and she has normal active and passive range of motion of the bilateral hips.  I doubt underlying fracture however, given unreliable history and advanced age, we will obtain imaging of the head, neck, ribs, low back, and hip for further evaluation.  We will also obtain lab work and EKG to evaluate complaint of dizziness.  Lab work reviewed by me shows no leukocytosis, no anemia.   Remainder of lab work  is at patient's baseline with mildly elevated creatinine.  EKG shows normal sinus rhythm with no significant changes from last tracing.  No evidence of ACS/MI.  UA is not concerning for UTI or nephrolithiasis.  Imaging shows no acute osseous abnormalities, no evidence of fracture or dislocation.  She does have baseline arthritic changes and has had a remote history of pubic rami fractures.  Doubt CVA, ICH, or SAH. She has a stable T12 compression deformity but no acute abnormalities noted in the lumbar spine. Patient was ambulated with the assistance of 2 people and typically ambulates with a walker at baseline.  She does live with her son but is home alone while he is at work.  Social work and case management were consulted for further assistance.  The patient does not wish to be in a SNF at this time.  Case management will assist in setting up home health evaluation to further meet the patient's needs.  She will follow-up with her PCP for reevaluation.  On reevaluation the patient is resting comfortably no apparent distress.  She verbalizes that she wishes to go home.  Discussed strict ED return precautions.  Patient and patient's foster daughter and her son verbalized understanding of and agreement with plan and patient is stable for discharge home at this time.  She was evaluated by Dr. Ellender Hose who agrees with assessment and plan at this time.  Final Clinical Impressions(s) / ED Diagnoses   Final diagnoses:  Fall, initial encounter  Acute hip pain, left    ED Discharge Orders    None       Debroah Baller 10/02/17 [id number]    Duffy Bruce, MD 10/02/17 [phone]

## 2017-10-01 NOTE — ED Notes (Signed)
Pt stable, ambulatory with assistance, and verbalizes (aling with family) understanding of d/c instructions.

## 2017-10-01 NOTE — ED Notes (Signed)
Pt had unsteady gait when ambulated. Pt was a 2 assist when ambulating. Pt complained of left hip pain while ambulating.

## 2017-10-01 NOTE — Discharge Instructions (Addendum)
Take 500 to 1000 mg of Tylenol every 6 hours as needed for pain.  If you have a prescription for Tylenol 3, take this as prescribed instead.  Apply heating pads or ice packs 20 minutes on 20 minutes off to the affected areas for comfort.  Use the walker to help you walk around at all times.  Home health will be in contact to help assess your needs at home.  Follow-up with the PCP for reevaluation of symptoms if they persist.  Return to the emergency department if any concerning signs or symptoms develop such as fevers, worsening pain, swelling of the leg, or inability to walk.

## 2017-10-02 ENCOUNTER — Other Ambulatory Visit: Payer: Self-pay | Admitting: Internal Medicine

## 2017-10-02 DIAGNOSIS — E039 Hypothyroidism, unspecified: Secondary | ICD-10-CM

## 2017-10-02 DIAGNOSIS — M2550 Pain in unspecified joint: Secondary | ICD-10-CM

## 2017-10-03 NOTE — Telephone Encounter (Signed)
A medication refill was received from pharmacy for tylenol #3. Rx was pended to provider for approval  after verifying last fill date, provider, and quantity on PMP [place] database   According to [place], last refill was 07/05/17 by Dr. Eulas Post

## 2017-10-04 DIAGNOSIS — G629 Polyneuropathy, unspecified: Secondary | ICD-10-CM | POA: Diagnosis not present

## 2017-10-04 DIAGNOSIS — N189 Chronic kidney disease, unspecified: Secondary | ICD-10-CM | POA: Diagnosis not present

## 2017-10-04 DIAGNOSIS — J449 Chronic obstructive pulmonary disease, unspecified: Secondary | ICD-10-CM | POA: Diagnosis not present

## 2017-10-04 DIAGNOSIS — F0390 Unspecified dementia without behavioral disturbance: Secondary | ICD-10-CM | POA: Diagnosis not present

## 2017-10-04 DIAGNOSIS — I129 Hypertensive chronic kidney disease with stage 1 through stage 4 chronic kidney disease, or unspecified chronic kidney disease: Secondary | ICD-10-CM | POA: Diagnosis not present

## 2017-10-05 ENCOUNTER — Telehealth: Payer: Self-pay

## 2017-10-05 NOTE — Telephone Encounter (Signed)
Dwyane Dee, PT with [name] called to request verbal orders for 7 PT visits and social worker evaluation to connect patient with community resources.  Per [employer] standing order, verbal order given. Message will be sent to patient's provider as a FYI.

## 2017-10-09 ENCOUNTER — Ambulatory Visit (HOSPITAL_COMMUNITY)
Admission: RE | Admit: 2017-10-09 | Discharge: 2017-10-09 | Disposition: A | Discharge status: Home / Self Care | Payer: PPO | Source: Ambulatory Visit | Attending: Interventional Radiology | Admitting: Interventional Radiology

## 2017-10-09 DIAGNOSIS — S22080A Wedge compression fracture of T11-T12 vertebra, initial encounter for closed fracture: Secondary | ICD-10-CM | POA: Diagnosis not present

## 2017-10-09 NOTE — Consult Note (Signed)
Chief Complaint: Patient was seen in consultation today for thoracic 12 compression fracture.  Referring Physician(s): Garald Balding  Supervising Physician: Luanne Bras  Patient Status: [facility] - Out-pt  History of Present Illness: Misty Patterson is a 84 y.o. female with a past medical history of hypertension, peripheral vascular disease, dyslipidemia, DVT of right leg, COPD, pneumonia, GERD, esophageal dysmotility, Zenker's diverticulum, CKD, nocturia, urinary incontinence, ovarian cancer, restless leg syndrome, radiculopathy, peripheral neuropathy, osteoporosis, DJD, carpal tunnel syndrome, chronic back pain, dementia, anxiety, and depression. Approximately 2 months ago, she fell landing directly on her buttocks and began experiencing back pain. She went to urgent care for her back pain and had DG lumbar films revealing a thoracic 12 compression fracture. She was sent to Dr. Durward Fortes for further management.  MRI lumbar spine 08/03/2017: 1. Moderate T12 compression deformity involving anterior and middle columns, burst fracture. Edema indicates recent injury. 3 mm retropulsion of superior endplate resulting in mild-to-moderate canal stenosis and ventral thecal sac effacement. No cord compression. 2. Stable L5-S1 lumbar spondylosis with multilevel mild foraminal stenosis and canal stenosis. No high-grade foraminal or canal stenosis.  IR requested by Dr. Durward Fortes for management of thoracic 12 compression fracture. Patient awake and alert sitting in chair. Accompanied by friend/neighbor Complains of lower back pain.  History is limited due to patient's dementia. Her friend states that she has fallen 2 other times since the original fall that caused the compression fracture.  Patient is currently taking Plavix 75 mg once daily.    Past Medical History:  Diagnosis Date  . Anxiety    takes Valium daily  . Cancer ([place])    ovarian  . Cervical disc disease   . Chronic back pain    . Chronic kidney disease   . COPD (chronic obstructive pulmonary disease) ([place])    ProAir daily as needed  . CTS (carpal tunnel syndrome)   . Decreased GFR   . Dementia    takes aricept  . Depression    takes Zoloft daily  . DJD (degenerative joint disease) of cervical spine   . DVT (deep venous thrombosis) ([facility])    right leg  . Dyslipidemia    but doesn't take any meds   . Esophageal dysmotility   . GERD (gastroesophageal reflux disease)    takes pepcid daily  . Hypertension    takes Amlodipine and Atenolol daily  . Incontinence of urine   . Insomnia    takes Trazodone nightly  . Neuropathy   . Nocturia   . Osteoporosis    takes Vit D daily  . Ovarian cancer ([place])   . Peripheral neuropathy   . Peripheral vascular disease ([place])   . Pneumonia    hx of-15+yrs ago  . Radiculopathy   . Restless leg syndrome   . Urinary frequency   . Zenker's diverticulum    small    Past Surgical History:  Procedure Laterality Date  . ABDOMINAL AORTAGRAM  Feb. 20, 2015  . ABDOMINAL AORTAGRAM N/A 05/02/2013   Procedure: ABDOMINAL Maxcine Ham;  Surgeon: Elam Dutch, MD;  Location: [facility] CATH LAB;  Service: Cardiovascular;  Laterality: N/A;  . ABDOMINAL HYSTERECTOMY    . BACK SURGERY    . cataract surgery Bilateral   . COLONOSCOPY    . POSTERIOR CERVICAL FUSION/FORAMINOTOMY N/A 01/28/2015   Procedure: POSTERIOR CERVICAL DECOMPRESSION FUSION LEVEL 5 WITH INSTRUMENTATION AND ALLOGRAFT;  Surgeon: Phylliss Bob, MD;  Location: [facility];  Service: Orthopedics;  Laterality: N/A;  Cervical posterior  decompression fusion, cervical 3-4, cervical 4-5, cervical 5-6, cervical 6-7, cervical 6-thoracic 1 with instrumentation and allograft.  . TONSILLECTOMY    . VEIN SURGERY  1950    Allergies: Aspirin; Formaldehyde; and Sodium pentobarbital [pentobarbital]  Medications: Prior to Admission medications   Medication Sig Start Date End Date Taking? Authorizing Provider  acetaminophen-codeine (TYLENOL  #3) 300-30 MG tablet TAKE 1 TABLET BY MOUTH EVERY 8 HOURS AS NEEDED FOR PAIN 10/03/17   Reed, Tiffany L, DO  amLODipine (NORVASC) 5 MG tablet Take 1 tablet by mouth twice daily. 05/28/17   Gildardo Cranker, DO  B Complex-C (B-COMPLEX WITH VITAMIN C) tablet Take 1 tablet by mouth daily. Reported on 05/21/2015    [provider]  cholecalciferol (VITAMIN D) 1000 units tablet Take 1,000 Units by mouth daily.    [provider]  clopidogrel (PLAVIX) 75 MG tablet Take 75 mg by mouth daily. 09/21/17   [provider]  cyclobenzaprine (FLEXERIL) 5 MG tablet Take 1 tablet (5 mg total) by mouth 3 (three) times daily as needed for muscle spasms. 07/24/17   Gildardo Cranker, DO  diazepam (VALIUM) 10 MG tablet Take 1/2 tablet by mouth daily at bedtime. 09/19/17   Gildardo Cranker, DO  donepezil (ARICEPT) 10 MG tablet Take 10 mg by mouth daily. 09/21/17   [provider]  gabapentin (NEURONTIN) 600 MG tablet Take 1 tablet (600 mg total) by mouth at bedtime. 07/17/17   Gildardo Cranker, DO  levothyroxine (SYNTHROID, LEVOTHROID) 50 MCG tablet TAKE ONE TABLET BY MOUTH DAILY BEFORE BREAKFAST 10/03/17   Reed, Tiffany L, DO  memantine (NAMENDA XR) 28 MG CP24 24 hr capsule Take 1 capsule (28 mg total) by mouth daily. 06/27/17   Gildardo Cranker, DO  Misc Natural Products (GLUCOSAMINE CHOND COMPLEX/MSM PO) Take 1 tablet by mouth daily.    [provider]  Multiple Vitamin (MULTIVITAMIN WITH MINERALS) TABS tablet Take 1 tablet by mouth daily.    [provider]  Omega 3 1000 MG CAPS Take 1,000 mg by mouth daily.    [provider]  sertraline (ZOLOFT) 50 MG tablet Take 1 tablet by mouth daily. 07/26/17   Gildardo Cranker, DO  traZODone (DESYREL) 50 MG tablet Take 1 tablet (50 mg total) by mouth at bedtime. 03/21/17   Gildardo Cranker, DO     Family History  Problem Relation Age of Onset  . Hypertension Mother   . Stroke Mother   . Lung cancer Son        Deceased, 3  . Other  Father        MVA  . Hypertension Brother   . Stroke Brother   . Kidney disease Sister   . Stroke Brother   . Diabetes Brother        DM  . Stroke Brother   . Cancer Sister        throat  . Healthy Son   . Heart disease Daughter     Social History   Socioeconomic History  . Marital status: Widowed    Spouse name: Not on file  . Number of children: 3  . Years of education: Not on file  . Highest education level: Not on file  Occupational History    Employer: RETIRED  Social Needs  . Financial resource strain: Not hard at all  . Food insecurity:    Worry: Never true    Inability: Never true  . Transportation needs:    Medical: No    Non-medical: No  Tobacco Use  . Smoking status: Never Smoker  . Smokeless tobacco: Never Used  Substance and Sexual Activity  . Alcohol use: No    Alcohol/week: 0.0 oz  . Drug use: No  . Sexual activity: Never    Birth control/protection: Surgical  Lifestyle  . Physical activity:    Days per week: 0 days    Minutes per session: 0 min  . Stress: Not at all  Relationships  . Social connections:    Talks on phone: Three times a week    Gets together: Once a week    Attends religious service: Never    Active member of club or organization: No    Attends meetings of clubs or organizations: Never    Relationship status: Widowed  Other Topics Concern  . Not on file  Social History Narrative   Diet? no      Do you drink/eat things with caffeine?  Coffee, 1 cup      Marital status?    widow                                What year were you married? 17      Do you live in a house, apartment, assisted living, condo, trailer, etc.? House      Is it one or more stories? 1      How many persons live in your home?       Do you have any pets in your home? (please list)  1      Current or past profession: hair dresser      Do you exercise?                yes                      Type & how often? Walk now in house      Do you have  a living will? yes      Do you have a DNR form?                                  If not, do you want to discuss one?      Do you have signed POA/HPOA for forms?      Review of Systems: A 12 point ROS discussed and pertinent positives are indicated in the HPI above.  All other systems are negative.  Review of Systems  Unable to perform ROS: Dementia  Musculoskeletal: Positive for back pain.    Vital Signs: There were no vitals taken for this visit.  Physical Exam  Constitutional: She appears well-developed and well-nourished. No distress.  Pulmonary/Chest: Effort normal. No respiratory distress.  Musculoskeletal:  Mild tenderness of midline spine at approximate level of thoracic 12.  Neurological: She is alert.  Skin: Skin is warm and dry.  Psychiatric: She has a normal mood and affect. Her behavior is normal. Judgment and thought content normal.  Nursing note and vitals reviewed.    Imaging: Dg Ribs Unilateral W/chest Left  Result Date: 10/01/2017 CLINICAL DATA:  Recent fall with left-sided chest pain, initial encounter EXAM: LEFT RIBS AND CHEST - 3+ VIEW COMPARISON:  02/12/2015 FINDINGS: Postsurgical changes are noted in the cervical spine. The cardiac shadow is within normal limits. Aortic calcifications are seen without aneurysmal dilatation. Old rib fractures are  noted on the left posteriorly. No pneumothorax is seen. No acute rib fractures are identified. IMPRESSION: No evidence of acute rib fracture. Chronic rib fractures are noted on the left. Electronically Signed   By: Inez Catalina M.D.   On: 10/01/2017 12:34   Dg Lumbar Spine Complete  Result Date: 10/01/2017 CLINICAL DATA:  Recent fall with low back pain, initial encounter EXAM: LUMBAR SPINE - COMPLETE 4+ VIEW COMPARISON:  08/03/2017 FINDINGS: T12 compression deformity is again identified and stable. 5 lumbar type vertebral bodies are well visualized. Changes of prior fusion at L4-5 are noted. No acute compression  deformity is noted. No significant anterolisthesis is seen. No other focal abnormality is noted. IMPRESSION: Stable T12 compression deformity.  No acute abnormality noted. Postsurgical changes at L4-5. Electronically Signed   By: Inez Catalina M.D.   On: 10/01/2017 12:27   Ct Head Wo Contrast  Result Date: 10/01/2017 CLINICAL DATA:  Dizziness with fall. Left hip pain. Initial encounter. EXAM: CT HEAD WITHOUT CONTRAST CT CERVICAL SPINE WITHOUT CONTRAST TECHNIQUE: Multidetector CT imaging of the head and cervical spine was performed following the standard protocol without intravenous contrast. Multiplanar CT image reconstructions of the cervical spine were also generated. COMPARISON:  02/10/2015 FINDINGS: CT HEAD FINDINGS Brain: No evidence of acute infarction, hemorrhage, hydrocephalus, extra-axial collection or mass lesion/mass effect. Confluent low-density in the cerebral white matter with indistinct deep gray nuclei, stable. Generalized atrophy Vascular: Atherosclerotic calcification. Skull: Negative for fracture Sinuses/Orbits: No evidence of injury.  Bilateral cataract resection CT CERVICAL SPINE FINDINGS Alignment: Normal Skull base and vertebrae: Negative for acute fracture. Remote T2 superior endplate fracture. Posterior-lateral fusion with hardware from C3-T1. There is solid arthrodesis throughout. Laminectomy at C4-C6. Soft tissues and spinal canal: No prevertebral fluid or swelling. No visible canal hematoma. Bulky atherosclerotic calcification in the carotids. Incidental prominent pyramidal lobe of the thyroid. Disc levels: Advanced disc degeneration at T1-2 attributed to adjacent segment disease and prior trauma. Atlantodental degeneration with partially calcified ligamentous thickening. No cervicomedullary impingement Upper chest: No evidence of injury IMPRESSION: 1. No evidence of acute intracranial or cervical spine injury. 2. Advanced chronic small vessel ischemia. 3. C3-T1 posterior fusion with  solid arthrodesis. Advanced T1-2 adjacent segment disc degeneration. Electronically Signed   By: Monte Fantasia M.D.   On: 10/01/2017 12:09   Ct Cervical Spine Wo Contrast  Result Date: 10/01/2017 CLINICAL DATA:  Dizziness with fall. Left hip pain. Initial encounter. EXAM: CT HEAD WITHOUT CONTRAST CT CERVICAL SPINE WITHOUT CONTRAST TECHNIQUE: Multidetector CT imaging of the head and cervical spine was performed following the standard protocol without intravenous contrast. Multiplanar CT image reconstructions of the cervical spine were also generated. COMPARISON:  02/10/2015 FINDINGS: CT HEAD FINDINGS Brain: No evidence of acute infarction, hemorrhage, hydrocephalus, extra-axial collection or mass lesion/mass effect. Confluent low-density in the cerebral white matter with indistinct deep gray nuclei, stable. Generalized atrophy Vascular: Atherosclerotic calcification. Skull: Negative for fracture Sinuses/Orbits: No evidence of injury.  Bilateral cataract resection CT CERVICAL SPINE FINDINGS Alignment: Normal Skull base and vertebrae: Negative for acute fracture. Remote T2 superior endplate fracture. Posterior-lateral fusion with hardware from C3-T1. There is solid arthrodesis throughout. Laminectomy at C4-C6. Soft tissues and spinal canal: No prevertebral fluid or swelling. No visible canal hematoma. Bulky atherosclerotic calcification in the carotids. Incidental prominent pyramidal lobe of the thyroid. Disc levels: Advanced disc degeneration at T1-2 attributed to adjacent segment disease and prior trauma. Atlantodental degeneration with partially calcified ligamentous thickening. No cervicomedullary impingement Upper chest: No evidence of injury  IMPRESSION: 1. No evidence of acute intracranial or cervical spine injury. 2. Advanced chronic small vessel ischemia. 3. C3-T1 posterior fusion with solid arthrodesis. Advanced T1-2 adjacent segment disc degeneration. Electronically Signed   By: Monte Fantasia M.D.    On: 10/01/2017 12:09   Dg Hip Unilat With Pelvis 2-3 Views Left  Result Date: 10/01/2017 CLINICAL DATA:  Recent fall with left hip pain, initial encounter EXAM: DG HIP (WITH OR WITHOUT PELVIS) 2-3V LEFT COMPARISON:  07/24/2017 FINDINGS: There are changes consistent with prior pubic rami fractures with healing on the right. Bilateral hip degenerative changes are seen. No acute fracture or dislocation is noted. No gross soft tissue abnormality is seen. IMPRESSION: Chronic changes without acute abnormality. Electronically Signed   By: Inez Catalina M.D.   On: 10/01/2017 12:30    Labs:  CBC: Recent Labs    12/13/16 1236 10/01/17 1132  WBC 7.1 7.4  HGB 14.3 14.5  HCT 43.7 46.7*  PLT 253 210    COAGS: No results for input(s): INR, APTT in the last 8760 hours.  BMP: Recent Labs    03/21/17 1130 06/19/17 1158 09/19/17 1240 10/01/17 1132  NA 142 134* 140 143  K 4.1 4.1 4.5 4.6  CL 103 98 102 104  CO2 31 28 32 29  GLUCOSE 135* 106 138 108*  BUN 21 10 15 14   CALCIUM 9.3 9.3 9.6 9.3  CREATININE 1.31* 1.07* 1.23* 1.18*  GFRNONAA 36* 46* 39* 40*  GFRAA 42* 54* 45* 46*    LIVER FUNCTION TESTS: Recent Labs    12/13/16 1236 03/21/17 1130 06/19/17 1158 09/19/17 1240  BILITOT  --  0.3 0.6  --   AST  --  28 31  --   ALT 20 20 22 15   PROT  --  6.9 6.8  --     TUMOR MARKERS: No results for input(s): AFPTM, CEA, CA199, CHROMGRNA in the last 8760 hours.  Assessment and Plan:  Thoracic 12 compression fracture. Reviewed imaging with patient and her friend. Explained that the best course of management for her thoracic 12 compression fracture is with a procedure called a kyphoplasty/vertebroplasty. Explained procedure, including risks and benefits. Explained that the goal of this procedure is to decrease her pain.  However, patient is demented. Explained that we will require a family member present on the day of procedure to obtain consent. In addition, in order for Dr. Estanislado Pandy  to preform procedure, someone will need to stay with patient 24/7 for a minimum of 2-3 days following procedure. Patient's friend states that patient's son might be available for both, but need to discuss with him first.  Plan for follow-up with image-guided thoracic 12 kyphoplasty/vertebroplasty. Patient's friend states that they will call us if she is able to get patient's son to agree with parameters listed above.  Patient is currently on Plavix 75 mg once daily- explained that this needs to be held for 5 days prior to procedure.  All questions answered and concerns addressed. Patient and her friend convey understanding and agree with plan.  Thank you for this interesting consult.  I greatly enjoyed meeting TOWANNA AVERY and look forward to participating in their care.  A copy of this report was sent to the requesting provider on this date.  Electronically Signed: Earley Abide, PA-C 10/09/2017, 11:02 AM   I spent a total of 30 Minutes in face to face in clinical consultation, greater than 50% of which was counseling/coordinating care for thoracic 12 compression fracture.

## 2017-10-11 DIAGNOSIS — G629 Polyneuropathy, unspecified: Secondary | ICD-10-CM | POA: Diagnosis not present

## 2017-10-11 DIAGNOSIS — F0390 Unspecified dementia without behavioral disturbance: Secondary | ICD-10-CM | POA: Diagnosis not present

## 2017-10-11 DIAGNOSIS — J449 Chronic obstructive pulmonary disease, unspecified: Secondary | ICD-10-CM | POA: Diagnosis not present

## 2017-10-11 DIAGNOSIS — N189 Chronic kidney disease, unspecified: Secondary | ICD-10-CM | POA: Diagnosis not present

## 2017-10-11 DIAGNOSIS — I129 Hypertensive chronic kidney disease with stage 1 through stage 4 chronic kidney disease, or unspecified chronic kidney disease: Secondary | ICD-10-CM | POA: Diagnosis not present

## 2017-10-18 ENCOUNTER — Telehealth: Payer: Self-pay | Admitting: Vascular Surgery

## 2017-10-18 ENCOUNTER — Ambulatory Visit: Payer: PPO | Admitting: Family

## 2017-10-18 ENCOUNTER — Encounter (HOSPITAL_COMMUNITY): Payer: PPO

## 2017-10-18 NOTE — Telephone Encounter (Signed)
Sched. Appt. Spoke with pt.mailed letter 11/06/17  1pm ABI 2pm LE art 3:15 f/u NP

## 2017-10-22 ENCOUNTER — Telehealth: Payer: Self-pay | Admitting: *Deleted

## 2017-10-22 NOTE — Telephone Encounter (Signed)
[facility] calling with a Juluis Rainier, they are concerned that patient is home all day alone while son works. Per Trish Mage they wanted son to put her in the PACE program but he is dragging his feet on filling out the form and if he doesn't fill out the form now they will have to wait until October. Per [place] safety is a concern and that the patient really needs placement. Also they will do out this week to do a social work visit.

## 2017-10-26 ENCOUNTER — Telehealth: Payer: Self-pay

## 2017-10-26 NOTE — Telephone Encounter (Signed)
Speech Therapist with Misty [facility] called requesting orders for nurse evaluation. Patient with dementia, left alone at home and not consistent with medications.  Per [employer] standing order, verbal order given. Message will be sent to patient's provider as a FYI.   Routed to Dr.Carter

## 2017-10-29 ENCOUNTER — Other Ambulatory Visit: Payer: Self-pay | Admitting: Internal Medicine

## 2017-10-29 DIAGNOSIS — F411 Generalized anxiety disorder: Secondary | ICD-10-CM

## 2017-10-29 DIAGNOSIS — I129 Hypertensive chronic kidney disease with stage 1 through stage 4 chronic kidney disease, or unspecified chronic kidney disease: Secondary | ICD-10-CM | POA: Diagnosis not present

## 2017-10-29 DIAGNOSIS — F0390 Unspecified dementia without behavioral disturbance: Secondary | ICD-10-CM | POA: Diagnosis not present

## 2017-10-29 DIAGNOSIS — J449 Chronic obstructive pulmonary disease, unspecified: Secondary | ICD-10-CM | POA: Diagnosis not present

## 2017-10-29 DIAGNOSIS — N189 Chronic kidney disease, unspecified: Secondary | ICD-10-CM | POA: Diagnosis not present

## 2017-10-29 DIAGNOSIS — G629 Polyneuropathy, unspecified: Secondary | ICD-10-CM | POA: Diagnosis not present

## 2017-10-29 NOTE — Telephone Encounter (Signed)
Yes I saw it -  I cannot complete the form. That is something the family has to do

## 2017-10-29 NOTE — Telephone Encounter (Signed)
Not sure if you seen this or not. Resending

## 2017-10-31 ENCOUNTER — Encounter: Payer: Self-pay | Admitting: Internal Medicine

## 2017-10-31 ENCOUNTER — Telehealth: Payer: Self-pay | Admitting: *Deleted

## 2017-10-31 NOTE — Telephone Encounter (Signed)
Misty Patterson with [name] called requesting to continue therapy for Medication management. Verbal orders given.

## 2017-11-06 ENCOUNTER — Ambulatory Visit (INDEPENDENT_AMBULATORY_CARE_PROVIDER_SITE_OTHER)
Admission: RE | Admit: 2017-11-06 | Discharge: 2017-11-06 | Disposition: A | Discharge status: Home / Self Care | Payer: PPO | Source: Ambulatory Visit | Attending: Family | Admitting: Family

## 2017-11-06 ENCOUNTER — Other Ambulatory Visit: Payer: Self-pay

## 2017-11-06 ENCOUNTER — Encounter: Payer: Self-pay | Admitting: Family

## 2017-11-06 ENCOUNTER — Ambulatory Visit (INDEPENDENT_AMBULATORY_CARE_PROVIDER_SITE_OTHER): Payer: PPO | Admitting: Family

## 2017-11-06 ENCOUNTER — Observation Stay (HOSPITAL_COMMUNITY)
Admission: EM | Admit: 2017-11-06 | Discharge: 2017-11-08 | Disposition: A | Discharge status: Home / Self Care | Payer: PPO | Attending: Emergency Medicine | Admitting: Emergency Medicine

## 2017-11-06 ENCOUNTER — Emergency Department (HOSPITAL_COMMUNITY): Payer: PPO

## 2017-11-06 ENCOUNTER — Encounter (HOSPITAL_COMMUNITY): Payer: Self-pay | Admitting: Emergency Medicine

## 2017-11-06 VITALS — BP 182/82 | HR 76 | Temp 97.0°F | Resp 16 | Ht 66.0 in | Wt 146.6 lb

## 2017-11-06 DIAGNOSIS — S12100A Unspecified displaced fracture of second cervical vertebra, initial encounter for closed fracture: Secondary | ICD-10-CM | POA: Diagnosis present

## 2017-11-06 DIAGNOSIS — I779 Disorder of arteries and arterioles, unspecified: Secondary | ICD-10-CM

## 2017-11-06 DIAGNOSIS — I739 Peripheral vascular disease, unspecified: Secondary | ICD-10-CM | POA: Diagnosis present

## 2017-11-06 DIAGNOSIS — R51 Headache: Secondary | ICD-10-CM | POA: Diagnosis present

## 2017-11-06 DIAGNOSIS — S12101A Unspecified nondisplaced fracture of second cervical vertebra, initial encounter for closed fracture: Principal | ICD-10-CM

## 2017-11-06 DIAGNOSIS — W19XXXA Unspecified fall, initial encounter: Secondary | ICD-10-CM | POA: Diagnosis not present

## 2017-11-06 DIAGNOSIS — F0390 Unspecified dementia without behavioral disturbance: Secondary | ICD-10-CM | POA: Diagnosis not present

## 2017-11-06 DIAGNOSIS — I4891 Unspecified atrial fibrillation: Secondary | ICD-10-CM | POA: Diagnosis present

## 2017-11-06 DIAGNOSIS — W06XXXA Fall from bed, initial encounter: Secondary | ICD-10-CM | POA: Insufficient documentation

## 2017-11-06 DIAGNOSIS — F0150 Vascular dementia without behavioral disturbance: Secondary | ICD-10-CM | POA: Diagnosis not present

## 2017-11-06 DIAGNOSIS — S12190A Other displaced fracture of second cervical vertebra, initial encounter for closed fracture: Secondary | ICD-10-CM | POA: Diagnosis not present

## 2017-11-06 DIAGNOSIS — Z7901 Long term (current) use of anticoagulants: Secondary | ICD-10-CM | POA: Insufficient documentation

## 2017-11-06 DIAGNOSIS — N189 Chronic kidney disease, unspecified: Secondary | ICD-10-CM | POA: Diagnosis not present

## 2017-11-06 DIAGNOSIS — I10 Essential (primary) hypertension: Secondary | ICD-10-CM

## 2017-11-06 DIAGNOSIS — Y92009 Unspecified place in unspecified non-institutional (private) residence as the place of occurrence of the external cause: Secondary | ICD-10-CM | POA: Diagnosis not present

## 2017-11-06 DIAGNOSIS — Z95820 Peripheral vascular angioplasty status with implants and grafts: Secondary | ICD-10-CM

## 2017-11-06 DIAGNOSIS — I499 Cardiac arrhythmia, unspecified: Secondary | ICD-10-CM | POA: Diagnosis not present

## 2017-11-06 DIAGNOSIS — Y939 Activity, unspecified: Secondary | ICD-10-CM | POA: Diagnosis not present

## 2017-11-06 DIAGNOSIS — S0003XA Contusion of scalp, initial encounter: Secondary | ICD-10-CM | POA: Diagnosis not present

## 2017-11-06 DIAGNOSIS — S199XXA Unspecified injury of neck, initial encounter: Secondary | ICD-10-CM

## 2017-11-06 DIAGNOSIS — Y999 Unspecified external cause status: Secondary | ICD-10-CM | POA: Diagnosis not present

## 2017-11-06 DIAGNOSIS — I129 Hypertensive chronic kidney disease with stage 1 through stage 4 chronic kidney disease, or unspecified chronic kidney disease: Secondary | ICD-10-CM | POA: Insufficient documentation

## 2017-11-06 DIAGNOSIS — Z8543 Personal history of malignant neoplasm of ovary: Secondary | ICD-10-CM | POA: Diagnosis not present

## 2017-11-06 DIAGNOSIS — S12101S Unspecified nondisplaced fracture of second cervical vertebra, sequela: Secondary | ICD-10-CM

## 2017-11-06 DIAGNOSIS — E039 Hypothyroidism, unspecified: Secondary | ICD-10-CM | POA: Diagnosis not present

## 2017-11-06 DIAGNOSIS — S0990XA Unspecified injury of head, initial encounter: Secondary | ICD-10-CM | POA: Diagnosis not present

## 2017-11-06 DIAGNOSIS — J449 Chronic obstructive pulmonary disease, unspecified: Secondary | ICD-10-CM | POA: Diagnosis not present

## 2017-11-06 DIAGNOSIS — S299XXA Unspecified injury of thorax, initial encounter: Secondary | ICD-10-CM | POA: Diagnosis not present

## 2017-11-06 LAB — BASIC METABOLIC PANEL
ANION GAP: 13 (ref 5–15)
BUN: 22 mg/dL (ref 8–23)
CHLORIDE: 101 mmol/L (ref 98–111)
CO2: 24 mmol/L (ref 22–32)
Calcium: 9.7 mg/dL (ref 8.9–10.3)
Creatinine, Ser: 1.12 mg/dL — ABNORMAL HIGH (ref 0.44–1.00)
GFR, EST AFRICAN AMERICAN: 49 mL/min — AB (ref >60)
GFR, EST NON AFRICAN AMERICAN: 42 mL/min — AB (ref >60)
Glucose, Bld: 119 mg/dL — ABNORMAL HIGH (ref 70–99)
POTASSIUM: 3.9 mmol/L (ref 3.5–5.1)
SODIUM: 138 mmol/L (ref 135–145)

## 2017-11-06 LAB — CBC
HEMATOCRIT: 46.5 % — AB (ref 36.0–46.0)
Hemoglobin: 14.9 g/dL (ref 12.0–15.0)
MCH: 28 pg (ref 26.0–34.0)
MCHC: 32 g/dL (ref 30.0–36.0)
MCV: 87.2 fL (ref 78.0–100.0)
Platelets: 219 10*3/uL (ref 150–400)
RBC: 5.33 MIL/uL — AB (ref 3.87–5.11)
RDW: 14.6 % (ref 11.5–15.5)
WBC: 10.6 10*3/uL — AB (ref 4.0–10.5)

## 2017-11-06 LAB — URINALYSIS, ROUTINE W REFLEX MICROSCOPIC
BILIRUBIN URINE: NEGATIVE
Bacteria, UA: NONE SEEN
GLUCOSE, UA: NEGATIVE mg/dL
HGB URINE DIPSTICK: NEGATIVE
Ketones, ur: NEGATIVE mg/dL
NITRITE: NEGATIVE
Specific Gravity, Urine: 1.014 (ref 1.005–1.030)
pH: 6 (ref 5.0–8.0)

## 2017-11-06 LAB — I-STAT TROPONIN, ED: Troponin i, poc: 0.01 ng/mL (ref 0.00–0.08)

## 2017-11-06 MED ORDER — ONDANSETRON HCL 4 MG PO TABS: 4.0000 mg | ORAL_TABLET | Freq: Four times a day (QID) | ORAL | Status: DC | PRN

## 2017-11-06 MED ORDER — DONEPEZIL HCL 10 MG PO TABS
10.0000 mg | ORAL_TABLET | Freq: Every day | ORAL | Status: DC
  Administered 2017-11-07 – 2017-11-08 (×2): 10 mg via ORAL
  Filled 2017-11-06 (×2): qty 1

## 2017-11-06 MED ORDER — CLOPIDOGREL BISULFATE 75 MG PO TABS
75.0000 mg | ORAL_TABLET | Freq: Every day | ORAL | Status: DC
  Administered 2017-11-07 – 2017-11-08 (×2): 75 mg via ORAL
  Filled 2017-11-06 (×2): qty 1

## 2017-11-06 MED ORDER — VITAMIN D 1000 UNITS PO TABS
1000.0000 [IU] | ORAL_TABLET | Freq: Every day | ORAL | Status: DC
  Administered 2017-11-07 – 2017-11-08 (×2): 1000 [IU] via ORAL
  Filled 2017-11-06 (×2): qty 1

## 2017-11-06 MED ORDER — BISACODYL 10 MG RE SUPP: 10.0000 mg | Freq: Every day | RECTAL | Status: DC | PRN

## 2017-11-06 MED ORDER — MEMANTINE HCL ER 28 MG PO CP24
28.0000 mg | ORAL_CAPSULE | Freq: Every day | ORAL | Status: DC
  Administered 2017-11-07 – 2017-11-08 (×2): 28 mg via ORAL
  Filled 2017-11-06 (×2): qty 1

## 2017-11-06 MED ORDER — ACETAMINOPHEN 325 MG PO TABS
650.0000 mg | ORAL_TABLET | Freq: Four times a day (QID) | ORAL | Status: DC | PRN
  Administered 2017-11-06 – 2017-11-08 (×5): 650 mg via ORAL
  Filled 2017-11-06 (×5): qty 2

## 2017-11-06 MED ORDER — ACETAMINOPHEN 650 MG RE SUPP: 650.0000 mg | Freq: Four times a day (QID) | RECTAL | Status: DC | PRN

## 2017-11-06 MED ORDER — CYCLOBENZAPRINE HCL 10 MG PO TABS
5.0000 mg | ORAL_TABLET | Freq: Three times a day (TID) | ORAL | Status: DC | PRN
  Administered 2017-11-07 – 2017-11-08 (×3): 5 mg via ORAL
  Filled 2017-11-06 (×4): qty 1

## 2017-11-06 MED ORDER — LEVOTHYROXINE SODIUM 50 MCG PO TABS
50.0000 ug | ORAL_TABLET | Freq: Every day | ORAL | Status: DC
  Administered 2017-11-07 – 2017-11-08 (×2): 50 ug via ORAL
  Filled 2017-11-06 (×3): qty 1

## 2017-11-06 MED ORDER — SODIUM CHLORIDE 0.9% FLUSH: 3.0000 mL | INTRAVENOUS | Status: DC | PRN

## 2017-11-06 MED ORDER — METOPROLOL TARTRATE 25 MG PO TABS
25.0000 mg | ORAL_TABLET | Freq: Two times a day (BID) | ORAL | Status: DC
  Administered 2017-11-06 – 2017-11-08 (×4): 25 mg via ORAL
  Filled 2017-11-06 (×4): qty 1

## 2017-11-06 MED ORDER — ONDANSETRON HCL 4 MG/2ML IJ SOLN: 4.0000 mg | Freq: Four times a day (QID) | INTRAMUSCULAR | Status: DC | PRN

## 2017-11-06 MED ORDER — DIAZEPAM 5 MG PO TABS
5.0000 mg | ORAL_TABLET | Freq: Every day | ORAL | Status: DC
  Administered 2017-11-06 – 2017-11-07 (×2): 5 mg via ORAL
  Filled 2017-11-06 (×2): qty 1

## 2017-11-06 MED ORDER — OMEGA 3 1000 MG PO CAPS: 1000.0000 mg | ORAL_CAPSULE | Freq: Every day | ORAL | Status: DC

## 2017-11-06 MED ORDER — B COMPLEX-C PO TABS
1.0000 | ORAL_TABLET | Freq: Every day | ORAL | Status: DC
  Administered 2017-11-07 – 2017-11-08 (×2): 1 via ORAL
  Filled 2017-11-06 (×2): qty 1

## 2017-11-06 MED ORDER — OMEGA-3-ACID ETHYL ESTERS 1 G PO CAPS
1.0000 g | ORAL_CAPSULE | Freq: Every day | ORAL | Status: DC
  Administered 2017-11-07 – 2017-11-08 (×2): 1 g via ORAL
  Filled 2017-11-06 (×2): qty 1

## 2017-11-06 MED ORDER — SERTRALINE HCL 50 MG PO TABS
50.0000 mg | ORAL_TABLET | Freq: Every day | ORAL | Status: DC
  Administered 2017-11-07 – 2017-11-08 (×2): 50 mg via ORAL
  Filled 2017-11-06 (×2): qty 1

## 2017-11-06 MED ORDER — SODIUM CHLORIDE 0.9 % IV SOLN: 250.0000 mL | INTRAVENOUS | Status: DC | PRN

## 2017-11-06 MED ORDER — SODIUM CHLORIDE 0.9% FLUSH
3.0000 mL | Freq: Two times a day (BID) | INTRAVENOUS | Status: DC
  Administered 2017-11-06 – 2017-11-08 (×4): 3 mL via INTRAVENOUS

## 2017-11-06 MED ORDER — AMLODIPINE BESYLATE 5 MG PO TABS
5.0000 mg | ORAL_TABLET | Freq: Two times a day (BID) | ORAL | Status: DC
  Administered 2017-11-06 – 2017-11-08 (×4): 5 mg via ORAL
  Filled 2017-11-06 (×4): qty 1

## 2017-11-06 MED ORDER — ADULT MULTIVITAMIN W/MINERALS CH
1.0000 | ORAL_TABLET | Freq: Every day | ORAL | Status: DC
  Administered 2017-11-07 – 2017-11-08 (×2): 1 via ORAL
  Filled 2017-11-06 (×2): qty 1

## 2017-11-06 MED ORDER — GABAPENTIN 600 MG PO TABS
600.0000 mg | ORAL_TABLET | Freq: Every day | ORAL | Status: DC
  Administered 2017-11-06 – 2017-11-07 (×2): 600 mg via ORAL
  Filled 2017-11-06 (×2): qty 1

## 2017-11-06 NOTE — Progress Notes (Signed)
[facility] [place]   CC: Follow up peripheral artery occlusive disease  History of Present Illness Misty Patterson is a 84 y.o. female who is s/p right above-knee popliteal stent placed in 2015 by Dr. Oneida Alar. This was done for right leg claudication. The patient denies claudication however she still has some pain in her right leg. This started happening after she fell at [facility] in 2015 and had some plastic wrap around her leg. She reports that her right leg is also cold all the time.  She reports some shortness of breath with minor activity such as getting up and out of a chair. However, she denies any shortness of breath with walking around the store. She previously had multiple level cervical fusion but still has some bilateral hand numbness.  Overall, her medical conditions are stable. These include past history of ovarian cancer, hypertension, hyperlipidemia, peripheral neuropathy, mild dementia.  Pt was last evaluated by Dr. Oneida Alar and K. Trinh PA-C on 10-12-16. At that time pt had right leg pain and is S/p right above knee popliteal stent (2015) Right above knee popliteal stent was patent. Her leg pain was not related to arterial insufficiency but rather neurogenic versus arthritis. Pt was to follow up in one year with repeat stent duplex and ABIs.  Patient with a palpable pulse in the right leg. The patient had overall been stable for some time now. Most of her aches and pains in her right leg were most likely degenerative arthritis and neurologic in nature. Patient was reassured that day.  Night before last she fell out of bed and hit the right side of her neck and right lower head on her night stand she denies LOC. She has right side of neck pain and head pain since then. She has not been evaluated for this.   Diabetic: No   Tobacco use: non-smoker, lived with a smoker, second hand exposure   Pt meds include:  Statin :No, states her cholesterol is being checked   ASA: No  Other anticoagulants/antiplatelets: Plavix , pt states she asked Dr. Gildardo Cranker to take her off of this as she felt cold   Past Medical History:  Diagnosis Date  . Anxiety    takes Valium daily  . Cancer ([place])    ovarian  . Cervical disc disease   . Chronic back pain   . Chronic kidney disease   . COPD (chronic obstructive pulmonary disease) ([place])    ProAir daily as needed  . CTS (carpal tunnel syndrome)   . Decreased GFR   . Dementia    takes aricept  . Depression    takes Zoloft daily  . DJD (degenerative joint disease) of cervical spine   . DVT (deep venous thrombosis) ([facility])    right leg  . Dyslipidemia    but doesn't take any meds   . Esophageal dysmotility   . GERD (gastroesophageal reflux disease)    takes pepcid daily  . Hypertension    takes Amlodipine and Atenolol daily  . Incontinence of urine   . Insomnia    takes Trazodone nightly  . Neuropathy   . Nocturia   . Osteoporosis    takes Vit D daily  . Ovarian cancer ([place])   . Peripheral neuropathy   . Peripheral vascular disease ([place])   . Pneumonia    hx of-15+yrs ago  . Radiculopathy   . Restless leg syndrome   . Urinary frequency   . Zenker's diverticulum  small    Social History Social History   Tobacco Use  . Smoking status: Never Smoker  . Smokeless tobacco: Never Used  Substance Use Topics  . Alcohol use: No    Alcohol/week: 0.0 standard drinks  . Drug use: No    Family History Family History  Problem Relation Age of Onset  . Hypertension Mother   . Stroke Mother   . Lung cancer Son        Deceased, 55  . Other Father        MVA  . Hypertension Brother   . Stroke Brother   . Kidney disease Sister   . Stroke Brother   . Diabetes Brother        DM  . Stroke Brother   . Cancer Sister        throat  . Healthy Son   . Heart disease Daughter     Past Surgical History:  Procedure Laterality Date  . ABDOMINAL AORTAGRAM  Feb. 20, 2015  . ABDOMINAL AORTAGRAM  N/A 05/02/2013   Procedure: ABDOMINAL Maxcine Ham;  Surgeon: Elam Dutch, MD;  Location: [facility] CATH LAB;  Service: Cardiovascular;  Laterality: N/A;  . ABDOMINAL HYSTERECTOMY    . BACK SURGERY    . cataract surgery Bilateral   . COLONOSCOPY    . POSTERIOR CERVICAL FUSION/FORAMINOTOMY N/A 01/28/2015   Procedure: POSTERIOR CERVICAL DECOMPRESSION FUSION LEVEL 5 WITH INSTRUMENTATION AND ALLOGRAFT;  Surgeon: Phylliss Bob, MD;  Location: [facility];  Service: Orthopedics;  Laterality: N/A;  Cervical posterior decompression fusion, cervical 3-4, cervical 4-5, cervical 5-6, cervical 6-7, cervical 6-thoracic 1 with instrumentation and allograft.  . TONSILLECTOMY    . VEIN SURGERY  1950    Allergies  Allergen Reactions  . Aspirin Other (See Comments)    REACTION:  Choking sensation, possible throat swelling  . Formaldehyde Swelling    Tongue swelling  . Sodium Pentobarbital [Pentobarbital] Other (See Comments)    Memory Loss    Current Outpatient Medications  Medication Sig Dispense Refill  . acetaminophen-codeine (TYLENOL #3) 300-30 MG tablet TAKE 1 TABLET BY MOUTH EVERY 8 HOURS AS NEEDED FOR PAIN 90 tablet 0  . amLODipine (NORVASC) 5 MG tablet Take 1 tablet by mouth twice daily. 180 tablet 1  . B Complex-C (B-COMPLEX WITH VITAMIN C) tablet Take 1 tablet by mouth daily. Reported on 05/21/2015    . cholecalciferol (VITAMIN D) 1000 units tablet Take 1,000 Units by mouth daily.    . clopidogrel (PLAVIX) 75 MG tablet Take 75 mg by mouth daily.    . cyclobenzaprine (FLEXERIL) 5 MG tablet Take 1 tablet (5 mg total) by mouth 3 (three) times daily as needed for muscle spasms. 60 tablet 2  . diazepam (VALIUM) 10 MG tablet TAKE 1/2 TABLET BY MOUTH AT BEDTIME 15 tablet 0  . donepezil (ARICEPT) 10 MG tablet Take 10 mg by mouth daily.    Marland Kitchen gabapentin (NEURONTIN) 600 MG tablet Take 1 tablet (600 mg total) by mouth at bedtime. 90 tablet 1  . levothyroxine (SYNTHROID, LEVOTHROID) 50 MCG tablet TAKE ONE TABLET BY  MOUTH DAILY BEFORE BREAKFAST 30 tablet 3  . memantine (NAMENDA XR) 28 MG CP24 24 hr capsule Take 1 capsule (28 mg total) by mouth daily. 90 capsule 3  . Misc Natural Products (GLUCOSAMINE CHOND COMPLEX/MSM PO) Take 1 tablet by mouth daily.    . Multiple Vitamin (MULTIVITAMIN WITH MINERALS) TABS tablet Take 1 tablet by mouth daily.    . Omega 3 1000 MG  CAPS Take 1,000 mg by mouth daily.    . sertraline (ZOLOFT) 50 MG tablet Take 1 tablet by mouth daily. 30 tablet 5  . traZODone (DESYREL) 50 MG tablet Take 1 tablet (50 mg total) by mouth at bedtime. 30 tablet 6   No current facility-administered medications for this visit.     ROS: See HPI for pertinent positives and negatives.   Physical Examination  Vitals:   11/06/17 1355  BP: (!) 182/82  Pulse: 76  Resp: 16  Temp: (!) 97 F (36.1 C)  TempSrc: Oral  SpO2: 96%  Weight: 146 lb 9.6 oz (66.5 kg)  Height: 5\' 6"  (1.676 m)   Body mass index is 23.66 kg/m.  General: A&O x 3, WDWN, elderly female. Gait: unsteady, using cane HENT: No gross abnormalities.  Eyes: PERRLA. Pulmonary: Respirations are non labored, CTAB, good air movement in all fields Cardiac: irregular rhythm, no detected murmur.         Carotid Bruits Right Left   Negative Negative   Radial pulses are palpable bilaterally   Adominal aortic pulse is not palpable                         VASCULAR EXAM: Extremities without ischemic changes, without Gangrene; without open wounds.                                                                                                          LE Pulses Right Left       FEMORAL   palpable  not palpable        POPLITEAL   palpable   not palpable       POSTERIOR TIBIAL   palpable   not palpable        DORSALIS PEDIS      ANTERIOR TIBIAL  palpable  not palpable    Abdomen: soft, NT, no palpable masses. Skin: no rashes, no cellulitis, no ulcers noted. Musculoskeletal: no muscle wasting or atrophy.  Neurologic: A&O X  3; appropriate affect, Sensation is normal; MOTOR FUNCTION:  moving all extremities equally, motor strength 5/5 throughout. Speech is fluent/normal. CN 2-12 intact. Psychiatric: Thought content is normal, mood appropriate for clinical situation.     ASSESSMENT: TANEAH MASRI is a 84 y.o. female who is s/p right above-knee popliteal stent placed in 2015 by Dr. Oneida Alar. This was done for right leg claudication. She has palpable right pedal pulses with no signs of ischemia in her feet or legs.   I advised pt foster daughter to take pt to [facility] Emergency Dept to evaluate head and neck pain since hitting her head and neck on her night stand two nighst ago, no LOC. And to evaluate asymptomatic irregular cardiac rhythm with no hx of irregular cardiac rhythm.   I spoke with Almyra Free, nurse at [facility] ED, and let her know that pt is having head and neck pain since she fell out of bed two nights ago, no LOC, and that her cardiac rhythm is irregular with no hx of  irregular cardiac rhythm.   DATA  Right LE Arterial Duplex (11-06-17): No stenosis in the distal SFA to above the knee popliteal artery stent. All biphasic waveforms.   ABI (Date: 11/06/2017):  R:   ABI: 1.03 (was 0.90 on 10-12-16),   PT: bi  DP: bi  TBI:  0.71, toe pressure 147   L:   ABI: 0.70 (was 0.71),   PT: bi  DP: bi  TBI: 0.49, toe pressure 102 Stable bilateral ABI with all biphasic waveforms. Normal in the right, moderate disease in the left.     PLAN:  Based on the patient's vascular studies and examination, pt will return to clinic in 1 year with ABI's.   I discussed in depth with the patient the nature of atherosclerosis, and emphasized the importance of maximal medical management including strict control of blood pressure, blood glucose, and lipid levels, obtaining regular exercise, and continued cessation of smoking.  The patient is aware that without maximal medical management the underlying atherosclerotic disease  process will progress, limiting the benefit of any interventions.  The patient was given information about PAD including signs, symptoms, treatment, what symptoms should prompt the patient to seek immediate medical care, and risk reduction measures to take.  Clemon Chambers, RN, MSN, FNP-C [facility] Phone: [phone]  Clinic MD: Bishop Dublin  11/06/17 2:25 PM

## 2017-11-06 NOTE — ED Notes (Signed)
Purewick put in place 

## 2017-11-06 NOTE — ED Triage Notes (Signed)
Pt family reports that she is on plavix

## 2017-11-06 NOTE — ED Triage Notes (Signed)
Pt states that she fell out of bed and hit her head last night and is having pain in the right side and along her neck. Denies use of blood thinners. No neurological deficits at this time. Pt also reports that she went to her vein specialist today and they told her she has an abnormal heart rhythm, denies chest pain but endorses SOB.

## 2017-11-06 NOTE — ED Provider Notes (Signed)
[facility] EMERGENCY DEPARTMENT Provider Note   CSN: [id number] Arrival date & time: 11/06/17  1505     History   Chief Complaint Chief Complaint  Patient presents with  . Irregular Heart Beat  . Fall    HPI Misty Patterson is a 84 y.o. female.  The history is provided by the patient.  Fall  This is a recurrent problem. Episode onset: 2 days ago. The problem occurs every several days. The problem has not changed since onset.Associated symptoms include headaches. Pertinent negatives include no chest pain, no abdominal pain and no shortness of breath. Associated symptoms comments: Neck pain. Exacerbated by: moving. Nothing relieves the symptoms. She has tried nothing for the symptoms.    Past Medical History:  Diagnosis Date  . Anxiety    takes Valium daily  . Cancer ([place])    ovarian  . Cervical disc disease   . Chronic back pain   . Chronic kidney disease   . COPD (chronic obstructive pulmonary disease) ([place])    ProAir daily as needed  . CTS (carpal tunnel syndrome)   . Decreased GFR   . Dementia    takes aricept  . Depression    takes Zoloft daily  . DJD (degenerative joint disease) of cervical spine   . DVT (deep venous thrombosis) ([facility])    right leg  . Dyslipidemia    but doesn't take any meds   . Esophageal dysmotility   . GERD (gastroesophageal reflux disease)    takes pepcid daily  . Hypertension    takes Amlodipine and Atenolol daily  . Incontinence of urine   . Insomnia    takes Trazodone nightly  . Neuropathy   . Nocturia   . Osteoporosis    takes Vit D daily  . Ovarian cancer ([place])   . Peripheral neuropathy   . Peripheral vascular disease ([place])   . Pneumonia    hx of-15+yrs ago  . Radiculopathy   . Restless leg syndrome   . Urinary frequency   . Zenker's diverticulum    small    Patient Active Problem List   Diagnosis Date Noted  . Chronic obstructive pulmonary disease ([place]) 12/13/2016  . Acquired hypothyroidism  12/13/2016  . Recurrent major depressive disorder ([place]) 12/13/2016  . OAB (overactive bladder) 12/13/2016  . Mixed hyperlipidemia 12/13/2016  . Pain in joint involving multiple sites 05/12/2016  . High risk medication use 05/12/2016  . PAD (peripheral artery disease) ([place]) 05/12/2016  . History of deep vein thrombosis (DVT) of lower extremity 05/12/2016  . Myelopathy ([place]) 01/28/2015  . Chest pain 01/22/2015  . Abnormal ECG 01/22/2015  . Dementia 01/01/2015  . Cervical stenosis of spinal canal 01/01/2015  . Hereditary and idiopathic peripheral neuropathy 01/01/2015  . Radiculopathy 05/06/2014  . Pain of right lower leg 09/04/2013  . Paresthesia of both hands 09/04/2013  . Aftercare following surgery of the circulatory system, [place] 05/23/2013  . Muscle twitching-Right Leg 05/23/2013  . Peripheral vascular disease ([place]) 04/03/2013    Past Surgical History:  Procedure Laterality Date  . ABDOMINAL AORTAGRAM  Feb. 20, 2015  . ABDOMINAL AORTAGRAM N/A 05/02/2013   Procedure: ABDOMINAL Maxcine Ham;  Surgeon: Elam Dutch, MD;  Location: [facility] CATH LAB;  Service: Cardiovascular;  Laterality: N/A;  . ABDOMINAL HYSTERECTOMY    . BACK SURGERY    . cataract surgery Bilateral   . COLONOSCOPY    . POSTERIOR CERVICAL FUSION/FORAMINOTOMY N/A 01/28/2015   Procedure: POSTERIOR CERVICAL DECOMPRESSION FUSION LEVEL  5 WITH INSTRUMENTATION AND ALLOGRAFT;  Surgeon: Phylliss Bob, MD;  Location: [facility];  Service: Orthopedics;  Laterality: N/A;  Cervical posterior decompression fusion, cervical 3-4, cervical 4-5, cervical 5-6, cervical 6-7, cervical 6-thoracic 1 with instrumentation and allograft.  . TONSILLECTOMY    . VEIN SURGERY  1950     OB History   None      Home Medications    Prior to Admission medications   Medication Sig Start Date End Date Taking? Authorizing Provider  acetaminophen-codeine (TYLENOL #3) 300-30 MG tablet TAKE 1 TABLET BY MOUTH EVERY 8 HOURS AS NEEDED FOR PAIN 10/03/17    Reed, Tiffany L, DO  amLODipine (NORVASC) 5 MG tablet Take 1 tablet by mouth twice daily. 05/28/17   Gildardo Cranker, DO  B Complex-C (B-COMPLEX WITH VITAMIN C) tablet Take 1 tablet by mouth daily. Reported on 05/21/2015    [provider]  cholecalciferol (VITAMIN D) 1000 units tablet Take 1,000 Units by mouth daily.    [provider]  clopidogrel (PLAVIX) 75 MG tablet Take 75 mg by mouth daily. 09/21/17   [provider]  cyclobenzaprine (FLEXERIL) 5 MG tablet Take 1 tablet (5 mg total) by mouth 3 (three) times daily as needed for muscle spasms. 07/24/17   Gildardo Cranker, DO  diazepam (VALIUM) 10 MG tablet TAKE 1/2 TABLET BY MOUTH AT BEDTIME 10/29/17   Gildardo Cranker, DO  donepezil (ARICEPT) 10 MG tablet Take 10 mg by mouth daily. 09/21/17   [provider]  gabapentin (NEURONTIN) 600 MG tablet Take 1 tablet (600 mg total) by mouth at bedtime. 07/17/17   Gildardo Cranker, DO  levothyroxine (SYNTHROID, LEVOTHROID) 50 MCG tablet TAKE ONE TABLET BY MOUTH DAILY BEFORE BREAKFAST 10/03/17   Reed, Tiffany L, DO  memantine (NAMENDA XR) 28 MG CP24 24 hr capsule Take 1 capsule (28 mg total) by mouth daily. 06/27/17   Gildardo Cranker, DO  Misc Natural Products (GLUCOSAMINE CHOND COMPLEX/MSM PO) Take 1 tablet by mouth daily.    [provider]  Multiple Vitamin (MULTIVITAMIN WITH MINERALS) TABS tablet Take 1 tablet by mouth daily.    [provider]  Omega 3 1000 MG CAPS Take 1,000 mg by mouth daily.    [provider]  sertraline (ZOLOFT) 50 MG tablet Take 1 tablet by mouth daily. 07/26/17   Gildardo Cranker, DO  traZODone (DESYREL) 50 MG tablet Take 1 tablet (50 mg total) by mouth at bedtime. 03/21/17   Gildardo Cranker, DO    Family History Family History  Problem Relation Age of Onset  . Hypertension Mother   . Stroke Mother   . Lung cancer Son        Deceased, 79  . Other Father        MVA  . Hypertension Brother   . Stroke Brother   . Kidney disease  Sister   . Stroke Brother   . Diabetes Brother        DM  . Stroke Brother   . Cancer Sister        throat  . Healthy Son   . Heart disease Daughter     Social History Social History   Tobacco Use  . Smoking status: Never Smoker  . Smokeless tobacco: Never Used  Substance Use Topics  . Alcohol use: No    Alcohol/week: 0.0 standard drinks  . Drug use: No     Allergies   Aspirin; Formaldehyde; and Sodium pentobarbital [pentobarbital]   Review of Systems Review of Systems  Constitutional: Negative for activity change.  HENT: Negative for congestion and rhinorrhea.   Eyes: Negative for pain.  Respiratory: Negative for shortness of breath.   Cardiovascular: Negative for chest pain.  Gastrointestinal: Negative for abdominal pain.  Genitourinary: Negative for flank pain.  Musculoskeletal: Positive for neck pain. Negative for back pain.  Skin: Negative for wound.  Neurological: Positive for headaches.     Physical Exam Updated Vital Signs BP (!) 172/87 (BP Location: Right Arm)   Pulse 86   Temp 98.7 F (37.1 C) (Oral)   Resp 18   SpO2 100%   Physical Exam  Constitutional: She appears well-developed and well-nourished. No distress.  HENT:  Head: Normocephalic and atraumatic.  Eyes: Conjunctivae are normal.  Neck: Neck supple.  Cardiovascular: Normal rate and regular rhythm.  No murmur heard. Pulmonary/Chest: Effort normal and breath sounds normal. No respiratory distress.  Abdominal: Soft. There is no tenderness.  Musculoskeletal: She exhibits no edema.  Neurological: She is alert.  5/5 strength, sensation bilateral upper and lower extremities. Grip strength normal bilaterally. Patient denies incontinence. 2+ brachial and 1+ patellar reflexes. Cranial nerves intact. Ambulation deferred at this time.  Skin: Skin is warm and dry.  Psychiatric: She has a normal mood and affect.  Nursing note and vitals reviewed.    ED Treatments / Results  Labs (all labs  ordered are listed, but only abnormal results are displayed) Labs Reviewed  BASIC METABOLIC PANEL - Abnormal; Notable for the following components:      Result Value   Glucose, Bld 119 (*)    Creatinine, Ser 1.12 (*)    GFR calc non Af Amer 42 (*)    GFR calc Af Amer 49 (*)    All other components within normal limits  CBC - Abnormal; Notable for the following components:   WBC 10.6 (*)    RBC 5.33 (*)    HCT 46.5 (*)    All other components within normal limits  I-STAT TROPONIN, ED    EKG EKG Interpretation  Date/Time:  Tuesday November 06 2017 15:18:48 EDT Ventricular Rate:  90 PR Interval:    QRS Duration: 80 QT Interval:  320 QTC Calculation: 391 R Axis:   1 Text Interpretation:  Atrial fibrillation Moderate voltage criteria for LVH, may be normal variant Nonspecific T wave abnormality Abnormal ECG afib new since previous Confirmed by Wandra Arthurs [phone]) on 11/06/2017 4:56:39 PM   Radiology Dg Chest 2 View  Result Date: 11/06/2017 CLINICAL DATA:  Recent fall with shortness of Breath EXAM: CHEST - 2 VIEW COMPARISON:  10/01/2017 FINDINGS: Cardiac shadow is stable. Aortic calcifications are again seen. Lungs are well aerated bilaterally. No acute bony abnormality is seen. No pleural effusion is noted. IMPRESSION: No active cardiopulmonary disease. Electronically Signed   By: Inez Catalina M.D.   On: 11/06/2017 16:19   Ct Head Wo Contrast  Result Date: 11/06/2017 CLINICAL DATA:  Hit right side of the head and neck after falling out of bed last night. EXAM: CT HEAD WITHOUT CONTRAST CT CERVICAL SPINE WITHOUT CONTRAST TECHNIQUE: Multidetector CT imaging of the head and cervical spine was performed following the standard protocol without intravenous contrast. Multiplanar CT image reconstructions of the cervical spine were also generated. COMPARISON:  CT head and cervical spine dated October 01, 2017. FINDINGS: CT HEAD FINDINGS Brain: No evidence of acute infarction, hemorrhage,  hydrocephalus, extra-axial collection or mass lesion/mass effect. Stable atrophy and chronic microvascular ischemic change. Vascular: Calcified atherosclerosis at the skullbase. No hyperdense  vessel. Skull: Negative for fracture or focal lesion. Sinuses/Orbits: No acute finding. Other: Small right frontal scalp hematoma. CT CERVICAL SPINE FINDINGS Alignment: Normal. Skull base and vertebrae: Acute, nondisplaced vertical fracture involving the body and lateral masses of C2. No extension into the transverse foramina. Minimal widening of the right C2-C3 facet joint. Prior C3-T1 posterior fusion and C4-C6 posterior decompression. Soft tissues and spinal canal: No prevertebral fluid or swelling. No visible canal hematoma. Disc levels: Severe degenerative disc disease at T1-T2 is unchanged. Upper chest: Negative. Other: Bilateral carotid artery calcific atherosclerosis. Unchanged 9 mm hypodense nodule in the left thyroid lobe. IMPRESSION: 1. Unstable acute, nondisplaced vertical fracture involving the C2 body and lateral masses. 2. No acute intracranial abnormality. Small right frontal scalp hematoma. These results were called by telephone at the time of interpretation on 11/06/2017 at 4:32 pm to Dr. Jola Schmidt , who verbally acknowledged these results. Electronically Signed   By: Titus Dubin M.D.   On: 11/06/2017 16:38   Ct Cervical Spine Wo Contrast  Result Date: 11/06/2017 CLINICAL DATA:  Hit right side of the head and neck after falling out of bed last night. EXAM: CT HEAD WITHOUT CONTRAST CT CERVICAL SPINE WITHOUT CONTRAST TECHNIQUE: Multidetector CT imaging of the head and cervical spine was performed following the standard protocol without intravenous contrast. Multiplanar CT image reconstructions of the cervical spine were also generated. COMPARISON:  CT head and cervical spine dated October 01, 2017. FINDINGS: CT HEAD FINDINGS Brain: No evidence of acute infarction, hemorrhage, hydrocephalus, extra-axial  collection or mass lesion/mass effect. Stable atrophy and chronic microvascular ischemic change. Vascular: Calcified atherosclerosis at the skullbase. No hyperdense vessel. Skull: Negative for fracture or focal lesion. Sinuses/Orbits: No acute finding. Other: Small right frontal scalp hematoma. CT CERVICAL SPINE FINDINGS Alignment: Normal. Skull base and vertebrae: Acute, nondisplaced vertical fracture involving the body and lateral masses of C2. No extension into the transverse foramina. Minimal widening of the right C2-C3 facet joint. Prior C3-T1 posterior fusion and C4-C6 posterior decompression. Soft tissues and spinal canal: No prevertebral fluid or swelling. No visible canal hematoma. Disc levels: Severe degenerative disc disease at T1-T2 is unchanged. Upper chest: Negative. Other: Bilateral carotid artery calcific atherosclerosis. Unchanged 9 mm hypodense nodule in the left thyroid lobe. IMPRESSION: 1. Unstable acute, nondisplaced vertical fracture involving the C2 body and lateral masses. 2. No acute intracranial abnormality. Small right frontal scalp hematoma. These results were called by telephone at the time of interpretation on 11/06/2017 at 4:32 pm to Dr. Jola Schmidt , who verbally acknowledged these results. Electronically Signed   By: Titus Dubin M.D.   On: 11/06/2017 16:38    Procedures Procedures (including critical care time)  Medications Ordered in ED Medications - No data to display   Initial Impression / Assessment and Plan / ED Course  I have reviewed the triage vital signs and the nursing notes.  Pertinent labs & imaging results that were available during my care of the patient were reviewed by me and considered in my medical decision making (see chart for details).     The patient is a 36yoF with PMH dementia, chronic back pain, COPD who presents with a headache and neck pain from outpatient office.  The patient reports that 2 days ago she had a fall out of bed and hit  her head.  Since that time she has been having headaches and neck pain.  The patient denies any weakness, numbness, incontinence.  She presented to an outpatient appointment for  ultrasound of her right lower extremity and was found to have an irregular heartbeat.  EKG shows atrial fibrillation.  The patient denies palpitations and it is unclear as to how long the patient has been in A. fib.  Patient does not have a history of A. fib and has not been anticoagulated.  Imaging shows no intracranial abnormalities, however does show an unstable C2 fracture.  Neurosurgery consulted and recommended immobilization in Aspen collar and does not recommend operative intervention.  Urinalysis does not reveal any signs of cystitis.  BMP reveals electrolytes within normal limit and creatinine slightly elevated, similar to prior.  CBC reveals slight leukocytosis, no significant anemia and platelets are within normal limits.  Internal medicine consulted for admission for new onset atrial fibrillation.  The patient does not require any medications for rate control and is hemodynamically stable at this time next.    Final Clinical Impressions(s) / ED Diagnoses   Final diagnoses:  None    ED Discharge Orders    None       Irven Baltimore, MD 11/07/17 1553    Drenda Freeze, MD 11/10/17 2115

## 2017-11-06 NOTE — ED Provider Notes (Signed)
Charge nurse made aware. Aspen collar ordered. Pt is being brought back to [facility] now  Dr Darl Householder made aware   Jola Schmidt, MD 11/06/17 [phone]

## 2017-11-06 NOTE — Patient Instructions (Signed)

## 2017-11-06 NOTE — H&P (Addendum)
[facility] History and Physical  Misty Patterson IEP:[id number] DOB: Oct 01, 1926 DOA: 11/06/2017  Referring physician: Dr Darl Householder PCP: Gildardo Cranker, DO   Chief Complaint: New onset atrial fibrillation  HPI: Misty Patterson is a 84 y.o. female w hx of RLS, PNA, HTN, dementia, DVT not on anticoagulation presented to ED after falling at home w/ neck pain.  Patient lives alone, her son stays with her at night, they live in [place].  They took her driver's license away about 2 mos ago, presumably for dementia.  She presented todady with neck pain w/ turning the head.  She suffered a fall from bed today.  She has hx of prior lumbar spine decompression surgery in Feb 2016 and then had C-spine decompression surgery in Nov 2016.  Patient was seen by neurosurgery in ED and diagnosed a C2 fracture, recommended immobilization in Aspen braced and she can be mobilized w/ physical therapy and dc'd home when cleared medically and f/u w/ neurosurg in 2 wks.  We are asked to see for medical admission.   Patient has no c/o at this time, no SOB or CP, no abd pain, no n/v/d, no fevers, no paralysis of arms or legs.    ROS  denies CP  no joint pain   no HA  no blurry vision  no rash  no diarrhea  no nausea/ vomiting  no dysuria  no difficulty voiding  no change in urine color    Past Medical History  Past Medical History:  Diagnosis Date  . Anxiety    takes Valium daily  . Cancer ([place])    ovarian  . Cervical disc disease   . Chronic back pain   . Chronic kidney disease   . COPD (chronic obstructive pulmonary disease) ([place])    ProAir daily as needed  . CTS (carpal tunnel syndrome)   . Decreased GFR   . Dementia    takes aricept  . Depression    takes Zoloft daily  . DJD (degenerative joint disease) of cervical spine   . DVT (deep venous thrombosis) ([facility])    right leg  . Dyslipidemia    but doesn't take any meds   . Esophageal dysmotility   . GERD (gastroesophageal reflux disease)    takes pepcid daily  . Hypertension    takes Amlodipine and Atenolol daily  . Incontinence of urine   . Insomnia    takes Trazodone nightly  . Neuropathy   . Nocturia   . Osteoporosis    takes Vit D daily  . Ovarian cancer ([place])   . Peripheral neuropathy   . Peripheral vascular disease ([place])   . Pneumonia    hx of-15+yrs ago  . Radiculopathy   . Restless leg syndrome   . Urinary frequency   . Zenker's diverticulum    small   Past Surgical History  Past Surgical History:  Procedure Laterality Date  . ABDOMINAL AORTAGRAM  Feb. 20, 2015  . ABDOMINAL AORTAGRAM N/A 05/02/2013   Procedure: ABDOMINAL Maxcine Ham;  Surgeon: Elam Dutch, MD;  Location[facility] CATH LAB;  Service: Cardiovascular;  Laterality: N/A;  . ABDOMINAL HYSTERECTOMY    . BACK SURGERY    . cataract surgery Bilateral   . COLONOSCOPY    . POSTERIOR CERVICAL FUSION/FORAMINOTOMY N/A 01/28/2015   Procedure: POSTERIOR CERVICAL DECOMPRESSION FUSION LEVEL 5 WITH INSTRUMENTATION AND ALLOGRAFT;  Surgeon: Phylliss Bob, MD;  Location: [facility];  Service: Orthopedics;  Laterality: N/A;  Cervical posterior decompression fusion, cervical 3-4, cervical  4-5, cervical 5-6, cervical 6-7, cervical 6-thoracic 1 with instrumentation and allograft.  . TONSILLECTOMY    . VEIN SURGERY  1950   Family History  Family History  Problem Relation Age of Onset  . Hypertension Mother   . Stroke Mother   . Lung cancer Son        Deceased, 64  . Other Father        MVA  . Hypertension Brother   . Stroke Brother   . Kidney disease Sister   . Stroke Brother   . Diabetes Brother        DM  . Stroke Brother   . Cancer Sister        throat  . Healthy Son   . Heart disease Daughter    Social History  reports that she has never smoked. She has never used smokeless tobacco. She reports that she does not drink alcohol or use drugs. Allergies  Allergies  Allergen Reactions  . Aspirin Other (See Comments)    REACTION:  Choking sensation,  possible throat swelling  . Formaldehyde Swelling    Tongue swelling  . Sodium Pentobarbital [Pentobarbital] Other (See Comments)    Memory Loss   Home medications Prior to Admission medications   Medication Sig Start Date End Date Taking? Authorizing Provider  acetaminophen-codeine (TYLENOL #3) 300-30 MG tablet TAKE 1 TABLET BY MOUTH EVERY 8 HOURS AS NEEDED FOR PAIN Patient taking differently: Take 1 tablet by mouth every 8 (eight) hours as needed for moderate pain.  10/03/17  Yes Reed, Tiffany L, DO  amLODipine (NORVASC) 5 MG tablet Take 1 tablet by mouth twice daily. 05/28/17  Yes Carter, Monica, DO  B Complex-C (B-COMPLEX WITH VITAMIN C) tablet Take 1 tablet by mouth daily. Reported on 05/21/2015   Yes [provider]  cholecalciferol (VITAMIN D) 1000 units tablet Take 1,000 Units by mouth daily.   Yes [provider]  clopidogrel (PLAVIX) 75 MG tablet Take 75 mg by mouth daily. 09/21/17  Yes [provider]  cyclobenzaprine (FLEXERIL) 5 MG tablet Take 1 tablet (5 mg total) by mouth 3 (three) times daily as needed for muscle spasms. 07/24/17  Yes Eulas Post, Monica, DO  diazepam (VALIUM) 10 MG tablet TAKE 1/2 TABLET BY MOUTH AT BEDTIME Patient taking differently: Take 5 mg by mouth at bedtime.  10/29/17  Yes Eulas Post, Monica, DO  donepezil (ARICEPT) 10 MG tablet Take 10 mg by mouth daily. 09/21/17  Yes [provider]  gabapentin (NEURONTIN) 600 MG tablet Take 1 tablet (600 mg total) by mouth at bedtime. 07/17/17  Yes Gildardo Cranker, DO  levothyroxine (SYNTHROID, LEVOTHROID) 50 MCG tablet TAKE ONE TABLET BY MOUTH DAILY BEFORE BREAKFAST Patient taking differently: Take 50 mcg by mouth daily before breakfast.  10/03/17  Yes Reed, Tiffany L, DO  memantine (NAMENDA XR) 28 MG CP24 24 hr capsule Take 1 capsule (28 mg total) by mouth daily. 06/27/17  Yes Gildardo Cranker, DO  Multiple Vitamin (MULTIVITAMIN WITH MINERALS) TABS tablet Take 1 tablet by mouth daily.   Yes [provider]  Omega 3 1000 MG CAPS Take 1,000 mg by mouth daily.   Yes [provider]  sertraline (ZOLOFT) 50 MG tablet Take 1 tablet by mouth daily. 07/26/17  Yes Gildardo Cranker, DO  traZODone (DESYREL) 50 MG tablet Take 1 tablet (50 mg total) by mouth at bedtime. Patient not taking: Reported on 11/06/2017 03/21/17   Gildardo Cranker, DO   Liver Function Tests No results for input(s):  AST, ALT, ALKPHOS, BILITOT, PROT, ALBUMIN in the last 168 hours. No results for input(s): LIPASE, AMYLASE in the last 168 hours. CBC Recent Labs  Lab 11/06/17 1534  WBC 10.6*  HGB 14.9  HCT 46.5*  MCV 87.2  PLT 974   Basic Metabolic Panel Recent Labs  Lab 11/06/17 1534  NA 138  K 3.9  CL 101  CO2 24  GLUCOSE 119*  BUN 22  CREATININE 1.12*  CALCIUM 9.7     Vitals:   11/06/17 1620 11/06/17 1645 11/06/17 1700 11/06/17 1715  BP: (!) 172/87 (!) 195/98 (!) 185/115 (!) 176/124  Pulse: 86 62 (!) 54 87  Resp: 18  (!) 21 17  Temp: 98.7 F (37.1 C)     TempSrc: Oral     SpO2: 100% 99% 96% 97%   Exam: Gen elderly WF in good spirits, in hard c-collar No rash, cyanosis or gangrene Sclera anicteric, throat clear  No jvd or bruits Chest clear bilat no wheezing or rales Cor irreg irreg no mrg Abd soft ntnd no mass or ascites +bs GU defer MS no joint effusions or deformity Ext trace LE edema / no wounds or ulcers Neuro is alert, year "1980", "[facility]" , "Tuesday", nonfocal , good strength x 4 ext    Home meds: - norvasc 5 qd - clopidogrel 75 qd - diazepam 5mg  hs prn/ gabapentin 600 hs/ sertraline 50 qd/ trazodone 50 hs/ memantine 28 mg qd/ levothyroxine 50 qd / donezepil 10 qd - prn flexeril/ vitamins/ supplements - tylenol 3 prn  EKG (independ reviewed) > atrial fib 90 bpm, no ischemic changes CXR (independ reviewed) > no acute disease   Assessment: 1. Fall/ C2 fracture - seen by NSurg, see rec's as above. Hard collar for now, needs PT eval and mobilization.   NSurg wants to f/u in OP setting in 2 wks after dc'd.  2. New onset atrial fibrillation - not sure if good candidate for anticoagulation w/ dementia and falls.  Consult cardiology in am. Vent rate controlled w/o meds.  3. HTN - cont norvasc, will add metoprolol 25 bid as BP's are high 4. PAD sp RLE prox popliteal stent - on plavix for this 5. Dementia - not severe , mild-mod; cont memantine/ donezepil 6. Depression/ anxiety- cont zoloft, trazodone, valium prn 7. Hypothyroid - cont meds    Plan - as above       [place] D [facility] Pager [phone]   If 7PM-7AM, please contact night-coverage [email or web address] Password [facility] 11/06/2017, 6:26 PM

## 2017-11-06 NOTE — ED Notes (Signed)
Dinner tray ordered.

## 2017-11-06 NOTE — ED Notes (Signed)
Pt resting quietly at this time no complaints voiced.

## 2017-11-06 NOTE — ED Notes (Signed)
Neuro surgery at bedside.

## 2017-11-06 NOTE — Consult Note (Signed)
Re1ason for Consult: C2 fracture Referring Physician: Emergency department  Misty Patterson is an 84 y.o. female.  HPI: 84 year old female status post mechanical fall from bed.  Patient reports some neck pain with turning her head.  No radiating pain numbness weakness or paresthesias.  Patient status post prior posterior cervical decompression and fusion by Dr. Lynann Bologna and prior lumbar fusion by Dr. Lynann Bologna.  Work-up demonstrates new onset atrial fibrillation.  Patient otherwise hemostatically stable.  No other evidence of injury.  Work-up also demonstrates evidence of a C2 fracture.  Past Medical History:  Diagnosis Date  . Anxiety    takes Valium daily  . Cancer ([place])    ovarian  . Cervical disc disease   . Chronic back pain   . Chronic kidney disease   . COPD (chronic obstructive pulmonary disease) ([place])    ProAir daily as needed  . CTS (carpal tunnel syndrome)   . Decreased GFR   . Dementia    takes aricept  . Depression    takes Zoloft daily  . DJD (degenerative joint disease) of cervical spine   . DVT (deep venous thrombosis) ([facility])    right leg  . Dyslipidemia    but doesn't take any meds   . Esophageal dysmotility   . GERD (gastroesophageal reflux disease)    takes pepcid daily  . Hypertension    takes Amlodipine and Atenolol daily  . Incontinence of urine   . Insomnia    takes Trazodone nightly  . Neuropathy   . Nocturia   . Osteoporosis    takes Vit D daily  . Ovarian cancer ([place])   . Peripheral neuropathy   . Peripheral vascular disease ([place])   . Pneumonia    hx of-15+yrs ago  . Radiculopathy   . Restless leg syndrome   . Urinary frequency   . Zenker's diverticulum    small    Past Surgical History:  Procedure Laterality Date  . ABDOMINAL AORTAGRAM  Feb. 20, 2015  . ABDOMINAL AORTAGRAM N/A 05/02/2013   Procedure: ABDOMINAL Maxcine Ham;  Surgeon: Elam Dutch, MD;  Location: [facility] CATH LAB;  Service: Cardiovascular;  Laterality: N/A;  . ABDOMINAL  HYSTERECTOMY    . BACK SURGERY    . cataract surgery Bilateral   . COLONOSCOPY    . POSTERIOR CERVICAL FUSION/FORAMINOTOMY N/A 01/28/2015   Procedure: POSTERIOR CERVICAL DECOMPRESSION FUSION LEVEL 5 WITH INSTRUMENTATION AND ALLOGRAFT;  Surgeon: Phylliss Bob, MD;  Location: [facility];  Service: Orthopedics;  Laterality: N/A;  Cervical posterior decompression fusion, cervical 3-4, cervical 4-5, cervical 5-6, cervical 6-7, cervical 6-thoracic 1 with instrumentation and allograft.  . TONSILLECTOMY    . VEIN SURGERY  1950    Family History  Problem Relation Age of Onset  . Hypertension Mother   . Stroke Mother   . Lung cancer Son        Deceased, 59  . Other Father        MVA  . Hypertension Brother   . Stroke Brother   . Kidney disease Sister   . Stroke Brother   . Diabetes Brother        DM  . Stroke Brother   . Cancer Sister        throat  . Healthy Son   . Heart disease Daughter     Social History:  reports that she has never smoked. She has never used smokeless tobacco. She reports that she does not drink alcohol or use drugs.  Allergies:  Allergies  Allergen Reactions  . Aspirin Other (See Comments)    REACTION:  Choking sensation, possible throat swelling  . Formaldehyde Swelling    Tongue swelling  . Sodium Pentobarbital [Pentobarbital] Other (See Comments)    Memory Loss    Medications: I have reviewed the patient's current medications.  Results for orders placed or performed during the hospital encounter of 11/06/17 (from the past 48 hour(s))  Basic metabolic panel     Status: Abnormal   Collection Time: 11/06/17  3:34 PM  Result Value Ref Range   Sodium 138 135 - 145 mmol/L   Potassium 3.9 3.5 - 5.1 mmol/L    Comment: HEMOLYSIS AT THIS LEVEL MAY AFFECT RESULT   Chloride 101 98 - 111 mmol/L   CO2 24 22 - 32 mmol/L   Glucose, Bld 119 (H) 70 - 99 mg/dL   BUN 22 8 - 23 mg/dL   Creatinine, Ser 1.12 (H) 0.44 - 1.00 mg/dL   Calcium 9.7 8.9 - 10.3 mg/dL   GFR  calc non Af Amer 42 (L) >60 mL/min   GFR calc Af Amer 49 (L) >60 mL/min    Comment: (NOTE) The eGFR has been calculated using the CKD EPI equation. This calculation has not been validated in all clinical situations. eGFR's persistently <60 mL/min signify possible Chronic Kidney Disease.    Anion gap 13 5 - 15    Comment: Performed at [facility] [address] [place] [address]  CBC     Status: Abnormal   Collection Time: 11/06/17  3:34 PM  Result Value Ref Range   WBC 10.6 (H) 4.0 - 10.5 K/uL   RBC 5.33 (H) 3.87 - 5.11 MIL/uL   Hemoglobin 14.9 12.0 - 15.0 g/dL   HCT 46.5 (H) 36.0 - 46.0 %   MCV 87.2 78.0 - 100.0 fL   MCH 28.0 26.0 - 34.0 pg   MCHC 32.0 30.0 - 36.0 g/dL   RDW 14.6 11.5 - 15.5 %   Platelets 219 150 - 400 K/uL    Comment: Performed at [facility] [address] [place], [place] [address]  I-stat troponin, ED     Status: None   Collection Time: 11/06/17  3:38 PM  Result Value Ref Range   Troponin i, poc 0.01 0.00 - 0.08 ng/mL   Comment 3            Comment: Due to the release kinetics of cTnI, a negative result within the first hours of the onset of symptoms does not rule out myocardial infarction with certainty. If myocardial infarction is still suspected, repeat the test at appropriate intervals.     Dg Chest 2 View  Result Date: 11/06/2017 CLINICAL DATA:  Recent fall with shortness of Breath EXAM: CHEST - 2 VIEW COMPARISON:  10/01/2017 FINDINGS: Cardiac shadow is stable. Aortic calcifications are again seen. Lungs are well aerated bilaterally. No acute bony abnormality is seen. No pleural effusion is noted. IMPRESSION: No active cardiopulmonary disease. Electronically Signed   By: Inez Catalina M.D.   On: 11/06/2017 16:19   Ct Head Wo Contrast  Result Date: 11/06/2017 CLINICAL DATA:  Hit right side of the head and neck after falling out of bed last night. EXAM: CT HEAD WITHOUT CONTRAST CT CERVICAL SPINE WITHOUT CONTRAST TECHNIQUE:  Multidetector CT imaging of the head and cervical spine was performed following the standard protocol without intravenous contrast. Multiplanar CT image reconstructions of the cervical spine were also generated. COMPARISON:  CT head and cervical spine dated October 01, 2017. FINDINGS: CT HEAD FINDINGS Brain: No evidence of acute infarction, hemorrhage, hydrocephalus, extra-axial collection or mass lesion/mass effect. Stable atrophy and chronic microvascular ischemic change. Vascular: Calcified atherosclerosis at the skullbase. No hyperdense vessel. Skull: Negative for fracture or focal lesion. Sinuses/Orbits: No acute finding. Other: Small right frontal scalp hematoma. CT CERVICAL SPINE FINDINGS Alignment: Normal. Skull base and vertebrae: Acute, nondisplaced vertical fracture involving the body and lateral masses of C2. No extension into the transverse foramina. Minimal widening of the right C2-C3 facet joint. Prior C3-T1 posterior fusion and C4-C6 posterior decompression. Soft tissues and spinal canal: No prevertebral fluid or swelling. No visible canal hematoma. Disc levels: Severe degenerative disc disease at T1-T2 is unchanged. Upper chest: Negative. Other: Bilateral carotid artery calcific atherosclerosis. Unchanged 9 mm hypodense nodule in the left thyroid lobe. IMPRESSION: 1. Unstable acute, nondisplaced vertical fracture involving the C2 body and lateral masses. 2. No acute intracranial abnormality. Small right frontal scalp hematoma. These results were called by telephone at the time of interpretation on 11/06/2017 at 4:32 pm to Dr. Jola Schmidt , who verbally acknowledged these results. Electronically Signed   By: Titus Dubin M.D.   On: 11/06/2017 16:38   Ct Cervical Spine Wo Contrast  Result Date: 11/06/2017 CLINICAL DATA:  Hit right side of the head and neck after falling out of bed last night. EXAM: CT HEAD WITHOUT CONTRAST CT CERVICAL SPINE WITHOUT CONTRAST TECHNIQUE: Multidetector CT imaging of  the head and cervical spine was performed following the standard protocol without intravenous contrast. Multiplanar CT image reconstructions of the cervical spine were also generated. COMPARISON:  CT head and cervical spine dated October 01, 2017. FINDINGS: CT HEAD FINDINGS Brain: No evidence of acute infarction, hemorrhage, hydrocephalus, extra-axial collection or mass lesion/mass effect. Stable atrophy and chronic microvascular ischemic change. Vascular: Calcified atherosclerosis at the skullbase. No hyperdense vessel. Skull: Negative for fracture or focal lesion. Sinuses/Orbits: No acute finding. Other: Small right frontal scalp hematoma. CT CERVICAL SPINE FINDINGS Alignment: Normal. Skull base and vertebrae: Acute, nondisplaced vertical fracture involving the body and lateral masses of C2. No extension into the transverse foramina. Minimal widening of the right C2-C3 facet joint. Prior C3-T1 posterior fusion and C4-C6 posterior decompression. Soft tissues and spinal canal: No prevertebral fluid or swelling. No visible canal hematoma. Disc levels: Severe degenerative disc disease at T1-T2 is unchanged. Upper chest: Negative. Other: Bilateral carotid artery calcific atherosclerosis. Unchanged 9 mm hypodense nodule in the left thyroid lobe. IMPRESSION: 1. Unstable acute, nondisplaced vertical fracture involving the C2 body and lateral masses. 2. No acute intracranial abnormality. Small right frontal scalp hematoma. These results were called by telephone at the time of interpretation on 11/06/2017 at 4:32 pm to Dr. Jola Schmidt , who verbally acknowledged these results. Electronically Signed   By: Titus Dubin M.D.   On: 11/06/2017 16:38    Pertinent items noted in HPI and remainder of comprehensive ROS otherwise negative. Blood pressure (!) 176/124, pulse 87, temperature 98.7 F (37.1 C), temperature source Oral, resp. rate 17, SpO2 97 %. Patient is awake and alert.  She is oriented and appropriate.  Her  speech is fluent.  Her judgment and insight are intact.  Her cranial nerve function is normal bilaterally.  Motor examination reveals 5/5 strength bilateral upper and lower extremities.  Sensory examination with some patchy distal sensory loss which is chronic in both upper extremities.  Reflexes brisk.  Examination of her head ears eyes nose throat  is unremarkable.  Chest and abdomen are benign.  Upper cervical spine is tender.  Airway midline.  Carotid pulses normal.  Assessment/Plan: Coronal plane C2 fracture with minimal displacement.  I recommend immobilization in Aspen brace.  Patient to be admitted to medicine for evaluation of new onset atrial fibrillation.  She can be mobilized with physical therapy and once patient is cleared medically she may be discharged home.  She should follow-up with me in 2 weeks.  Misty Patterson 11/06/2017, 5:46 PM

## 2017-11-07 DIAGNOSIS — S12101A Unspecified nondisplaced fracture of second cervical vertebra, initial encounter for closed fracture: Secondary | ICD-10-CM

## 2017-11-07 LAB — BASIC METABOLIC PANEL
ANION GAP: 8 (ref 5–15)
BUN: 21 mg/dL (ref 8–23)
CHLORIDE: 107 mmol/L (ref 98–111)
CO2: 27 mmol/L (ref 22–32)
Calcium: 8.7 mg/dL — ABNORMAL LOW (ref 8.9–10.3)
Creatinine, Ser: 1.11 mg/dL — ABNORMAL HIGH (ref 0.44–1.00)
GFR, EST AFRICAN AMERICAN: 50 mL/min — AB (ref >60)
GFR, EST NON AFRICAN AMERICAN: 43 mL/min — AB (ref >60)
Glucose, Bld: 113 mg/dL — ABNORMAL HIGH (ref 70–99)
POTASSIUM: 3.5 mmol/L (ref 3.5–5.1)
SODIUM: 142 mmol/L (ref 135–145)

## 2017-11-07 MED ORDER — TRAMADOL HCL 50 MG PO TABS: 50.0000 mg | ORAL_TABLET | Freq: Four times a day (QID) | ORAL | 0 refills | Status: AC | PRN

## 2017-11-07 MED ORDER — METOPROLOL TARTRATE 25 MG PO TABS: 25.0000 mg | ORAL_TABLET | Freq: Two times a day (BID) | ORAL | 2 refills | Status: AC

## 2017-11-07 NOTE — Evaluation (Signed)
Physical Therapy Evaluation Patient Details Name: Misty Patterson MRN: [id number] DOB: [birth year] Today's Date: 11/07/2017   History of Present Illness  Pt is a 84 y.o. F with significant PMH of RLS, PNA, HTN, dementia, DVT not on anticoagulation presenting to ED after falling at home with neck pain. Found to have C2 unstable, nondisplaced fracture.  Clinical Impression  At baseline, patient is independent with ADL's, dependent with IADL's, and uses Rollator for mobility. Reports history of 12 falls within past year. On PT evaluation, patient requiring moderate assistance to progress to sitting edge of bed, immediately screaming secondary to cervical pain, and laid back down. After 2 attempts, patient refusing further attempts at mobility. Patient was premedicated prior to session. Noted history of dementia and has poor awareness of deficits. Highly recommending SNF to maximize functional independence and decrease caregiver burden. Patient is currently presenting as a high fall risk secondary to history of frequent falling. Suspect patient will progress once pain is well controlled.    Follow Up Recommendations SNF    Equipment Recommendations  Other (comment)(TBD)    Recommendations for Other Services       Precautions / Restrictions Precautions Precautions: Fall;Cervical Precaution Booklet Issued: No Required Braces or Orthoses: Cervical Brace Cervical Brace: Hard collar;At all times Restrictions Weight Bearing Restrictions: No      Mobility  Bed Mobility Overal bed mobility: Needs Assistance Bed Mobility: Rolling;Sidelying to Sit;Sit to Sidelying Rolling: Min assist Sidelying to sit: Mod assist     Sit to sidelying: Min assist General bed mobility comments: mod assist to elevate trunk to sitting. once in partial sitting, pt immediately screamed and laid back down and had a similar reaction upon second attempt to sit   Transfers                 General transfer  comment: Not attempted due to pain  Ambulation/Gait                Stairs            Wheelchair Mobility    Modified Rankin (Stroke Patients Only)       Balance Overall balance assessment: Needs assistance Sitting-balance support: Bilateral upper extremity supported;Feet supported Sitting balance-Leahy Scale: Fair                                       Pertinent Vitals/Pain Pain Assessment: Faces Faces Pain Scale: Hurts worst Pain Location: cervical Pain Descriptors / Indicators: Sharp Pain Intervention(s): Limited activity within patient's tolerance;Monitored during session    Home Living Family/patient expects to be discharged to:: Private residence Living Arrangements: Alone;Other (Comment)(lives with son at night) Available Help at Discharge: Family Type of Home: House Home Access: Ramped entrance     Home Layout: One level Home Equipment: Walker - 4 wheels      Prior Function Level of Independence: Needs assistance   Gait / Transfers Assistance Needed: uses Rollator  ADL's / Homemaking Assistance Needed: Son assists with IADL's. Pt independently sponge bathes.  Comments: Reports 12 falls in past year.     Hand Dominance        Extremity/Trunk Assessment   Upper Extremity Assessment Upper Extremity Assessment: RUE deficits/detail;LUE deficits/detail RUE Deficits / Details: grossly 5/5. report of numbness in hands LUE Deficits / Details: grossly 5/5. report of numbness in hands    Lower Extremity Assessment Lower Extremity Assessment: Overall WFL for  tasks assessed    Cervical / Trunk Assessment Cervical / Trunk Assessment: Other exceptions Cervical / Trunk Exceptions: C2 fracture with Aspen collar  Communication   Communication: HOH  Cognition Arousal/Alertness: Awake/alert Behavior During Therapy: WFL for tasks assessed/performed Overall Cognitive Status: No family/caregiver present to determine baseline cognitive  functioning                                 General Comments: Pt able to follow simple commands but very distracted by pain and external stimuli.       General Comments      Exercises     Assessment/Plan    PT Assessment Patient needs continued PT services  PT Problem List Decreased activity tolerance;Decreased balance;Decreased mobility;Pain       PT Treatment Interventions DME instruction;Gait training;Functional mobility training;Therapeutic activities;Therapeutic exercise;Balance training;Patient/family education    PT Goals (Current goals can be found in the Care Plan section)  Acute Rehab PT Goals Patient Stated Goal: "go home and take care of my 3 dogs." PT Goal Formulation: With patient Time For Goal Achievement: 11/21/17 Potential to Achieve Goals: Fair    Frequency Min 3X/week   Barriers to discharge        Co-evaluation               AM-PAC PT "6 Clicks" Daily Activity  Outcome Measure Difficulty turning over in bed (including adjusting bedclothes, sheets and blankets)?: Unable Difficulty moving from lying on back to sitting on the side of the bed? : Unable Difficulty sitting down on and standing up from a chair with arms (e.g., wheelchair, bedside commode, etc,.)?: Unable Help needed moving to and from a bed to chair (including a wheelchair)?: A Lot Help needed walking in hospital room?: A Lot Help needed climbing 3-5 steps with a railing? : Total 6 Click Score: 8    End of Session Equipment Utilized During Treatment: Cervical collar Activity Tolerance: Patient limited by pain Patient left: in bed;with call bell/phone within reach;with bed alarm set Nurse Communication: Mobility status PT Visit Diagnosis: Pain;Other abnormalities of gait and mobility (R26.89) Pain - part of body: (neck)    Time: 2330-0762 PT Time Calculation (min) (ACUTE ONLY): 18 min   Charges:   PT Evaluation $PT Eval Moderate Complexity: 1 Mod           Ellamae Sia, PT, DPT Acute Rehabilitation Services  Pager: [place] 11/07/2017, 12:10 PM

## 2017-11-07 NOTE — Discharge Summary (Addendum)
Physician Discharge Summary  REN GRASSE YYT:[id number] DOB: [birth year] DOA: 11/06/2017  PCP: Gildardo Cranker, DO  Admit date: 11/06/2017 Discharge date: 11/07/2017  Time spent: 45 minutes  Recommendations for Outpatient Follow-up:  -PT has recommended SNF. -Patient will DC to SNF once bed available and insurance authorization is complete. -Needs follow-up with Dr. Earnie Larsson with neurosurgery in 2 weeks for follow-up on her C2 cervical fracture.  Discharge Diagnoses:  Principal Problem:   C2 cervical fracture [facility]) Active Problems:   Peripheral vascular disease ([place])   Dementia   Fall at home, initial encounter   New onset atrial fibrillation [facility])   Discharge Condition: Stable and improved  Filed Weights   11/06/17 1958  Weight: 66.5 kg    History of present illness:  As per Dr. Jonnie Finner on 8/27: Misty Patterson is a 84 y.o. female w hx of RLS, PNA, HTN, dementia, DVT not on anticoagulation presented to ED after falling at home w/ neck pain.  Patient lives alone, her son stays with her at night, they live in [place].  They took her driver's license away about 2 mos ago, presumably for dementia.  She presented todady with neck pain w/ turning the head.  She suffered a fall from bed today.  She has hx of prior lumbar spine decompression surgery in Feb 2016 and then had C-spine decompression surgery in Nov 2016.  Patient was seen by neurosurgery in ED and diagnosed a C2 fracture, recommended immobilization in Aspen braced and she can be mobilized w/ physical therapy and dc'd home when cleared medically and f/u w/ neurosurg in 2 wks.  We are asked to see for medical admission.   Patient has no c/o at this time, no SOB or CP, no abd pain, no n/v/d, no fevers, no paralysis of arms or legs.    Hospital Course:   Fall with a C2 cervical fracture -Seen by neurosurgery, recommends Aspen collar and follow-up in 2 weeks in the office. -Seen by physical therapy, who recommends  SNF. -Social work is aware, patient stable for discharge to SNF once bed available.  New onset atrial fibrillation -She is currently rate controlled, she is on Norvasc, metoprolol low-dose 25 mg twice daily was added on admission, she is tolerating this well. -She is most certainly not a candidate for anticoagulation given her advanced age, dementia and propensity to falls.  Hypertension -Systolics in the 275T to 700F, is on Norvasc at home, metoprolol has been added this admission.  Dementia -Not severe, continue Aricept, Namenda, she is starting to show signs of hospital delirium. -We will discharge home today.  Hypothyroidism -Continue Synthroid.  Procedures:  None  Consultations:  Neurosurgery, Dr. Annette Stable  Discharge Instructions  Discharge Instructions    Diet - low sodium heart healthy   Complete by:  As directed    Increase activity slowly   Complete by:  As directed      Allergies as of 11/07/2017      Reactions   Aspirin Other (See Comments)   REACTION:  Choking sensation, possible throat swelling   Formaldehyde Swelling   Tongue swelling   Sodium Pentobarbital [pentobarbital] Other (See Comments)   Memory Loss      Medication List    STOP taking these medications   traZODone 50 MG tablet Commonly known as:  DESYREL     TAKE these medications   acetaminophen-codeine 300-30 MG tablet Commonly known as:  TYLENOL #3 TAKE 1 TABLET BY MOUTH EVERY 8 HOURS  AS NEEDED FOR PAIN What changed:    reasons to take this  additional instructions   amLODipine 5 MG tablet Commonly known as:  NORVASC Take 1 tablet by mouth twice daily.   B-complex with vitamin C tablet Take 1 tablet by mouth daily. Reported on 05/21/2015   cholecalciferol 1000 units tablet Commonly known as:  VITAMIN D Take 1,000 Units by mouth daily.   clopidogrel 75 MG tablet Commonly known as:  PLAVIX Take 75 mg by mouth daily.   cyclobenzaprine 5 MG tablet Commonly known as:   FLEXERIL Take 1 tablet (5 mg total) by mouth 3 (three) times daily as needed for muscle spasms.   diazepam 10 MG tablet Commonly known as:  VALIUM TAKE 1/2 TABLET BY MOUTH AT BEDTIME What changed:    how much to take  how to take this  when to take this  additional instructions   donepezil 10 MG tablet Commonly known as:  ARICEPT Take 10 mg by mouth daily.   gabapentin 600 MG tablet Commonly known as:  NEURONTIN Take 1 tablet (600 mg total) by mouth at bedtime.   levothyroxine 50 MCG tablet Commonly known as:  SYNTHROID, LEVOTHROID TAKE ONE TABLET BY MOUTH DAILY BEFORE BREAKFAST What changed:  See the new instructions.   memantine 28 MG Cp24 24 hr capsule Commonly known as:  NAMENDA XR Take 1 capsule (28 mg total) by mouth daily.   metoprolol tartrate 25 MG tablet Commonly known as:  LOPRESSOR Take 1 tablet (25 mg total) by mouth 2 (two) times daily.   multivitamin with minerals Tabs tablet Take 1 tablet by mouth daily.   Omega 3 1000 MG Caps Take 1,000 mg by mouth daily.   sertraline 50 MG tablet Commonly known as:  ZOLOFT Take 1 tablet by mouth daily.   traMADol 50 MG tablet Commonly known as:  ULTRAM Take 1 tablet (50 mg total) by mouth every 6 (six) hours as needed.      Allergies  Allergen Reactions  . Aspirin Other (See Comments)    REACTION:  Choking sensation, possible throat swelling  . Formaldehyde Swelling    Tongue swelling  . Sodium Pentobarbital [Pentobarbital] Other (See Comments)    Memory Loss   Follow-up Information    Earnie Larsson, MD. Schedule an appointment as soon as possible for a visit in 2 week(s).   Specialty:  Neurosurgery Contact information: [address] [place] [address] [phone]            The results of significant diagnostics from this hospitalization (including imaging, microbiology, ancillary and laboratory) are listed below for reference.    Significant Diagnostic Studies: Dg  Chest 2 View  Result Date: 11/06/2017 CLINICAL DATA:  Recent fall with shortness of Breath EXAM: CHEST - 2 VIEW COMPARISON:  10/01/2017 FINDINGS: Cardiac shadow is stable. Aortic calcifications are again seen. Lungs are well aerated bilaterally. No acute bony abnormality is seen. No pleural effusion is noted. IMPRESSION: No active cardiopulmonary disease. Electronically Signed   By: Inez Catalina M.D.   On: 11/06/2017 16:19   Ct Head Wo Contrast  Result Date: 11/06/2017 CLINICAL DATA:  Hit right side of the head and neck after falling out of bed last night. EXAM: CT HEAD WITHOUT CONTRAST CT CERVICAL SPINE WITHOUT CONTRAST TECHNIQUE: Multidetector CT imaging of the head and cervical spine was performed following the standard protocol without intravenous contrast. Multiplanar CT image reconstructions of the cervical spine were also generated. COMPARISON:  CT  head and cervical spine dated October 01, 2017. FINDINGS: CT HEAD FINDINGS Brain: No evidence of acute infarction, hemorrhage, hydrocephalus, extra-axial collection or mass lesion/mass effect. Stable atrophy and chronic microvascular ischemic change. Vascular: Calcified atherosclerosis at the skullbase. No hyperdense vessel. Skull: Negative for fracture or focal lesion. Sinuses/Orbits: No acute finding. Other: Small right frontal scalp hematoma. CT CERVICAL SPINE FINDINGS Alignment: Normal. Skull base and vertebrae: Acute, nondisplaced vertical fracture involving the body and lateral masses of C2. No extension into the transverse foramina. Minimal widening of the right C2-C3 facet joint. Prior C3-T1 posterior fusion and C4-C6 posterior decompression. Soft tissues and spinal canal: No prevertebral fluid or swelling. No visible canal hematoma. Disc levels: Severe degenerative disc disease at T1-T2 is unchanged. Upper chest: Negative. Other: Bilateral carotid artery calcific atherosclerosis. Unchanged 9 mm hypodense nodule in the left thyroid lobe. IMPRESSION: 1.  Unstable acute, nondisplaced vertical fracture involving the C2 body and lateral masses. 2. No acute intracranial abnormality. Small right frontal scalp hematoma. These results were called by telephone at the time of interpretation on 11/06/2017 at 4:32 pm to Dr. Jola Schmidt , who verbally acknowledged these results. Electronically Signed   By: Titus Dubin M.D.   On: 11/06/2017 16:38   Ct Cervical Spine Wo Contrast  Result Date: 11/06/2017 CLINICAL DATA:  Hit right side of the head and neck after falling out of bed last night. EXAM: CT HEAD WITHOUT CONTRAST CT CERVICAL SPINE WITHOUT CONTRAST TECHNIQUE: Multidetector CT imaging of the head and cervical spine was performed following the standard protocol without intravenous contrast. Multiplanar CT image reconstructions of the cervical spine were also generated. COMPARISON:  CT head and cervical spine dated October 01, 2017. FINDINGS: CT HEAD FINDINGS Brain: No evidence of acute infarction, hemorrhage, hydrocephalus, extra-axial collection or mass lesion/mass effect. Stable atrophy and chronic microvascular ischemic change. Vascular: Calcified atherosclerosis at the skullbase. No hyperdense vessel. Skull: Negative for fracture or focal lesion. Sinuses/Orbits: No acute finding. Other: Small right frontal scalp hematoma. CT CERVICAL SPINE FINDINGS Alignment: Normal. Skull base and vertebrae: Acute, nondisplaced vertical fracture involving the body and lateral masses of C2. No extension into the transverse foramina. Minimal widening of the right C2-C3 facet joint. Prior C3-T1 posterior fusion and C4-C6 posterior decompression. Soft tissues and spinal canal: No prevertebral fluid or swelling. No visible canal hematoma. Disc levels: Severe degenerative disc disease at T1-T2 is unchanged. Upper chest: Negative. Other: Bilateral carotid artery calcific atherosclerosis. Unchanged 9 mm hypodense nodule in the left thyroid lobe. IMPRESSION: 1. Unstable acute, nondisplaced  vertical fracture involving the C2 body and lateral masses. 2. No acute intracranial abnormality. Small right frontal scalp hematoma. These results were called by telephone at the time of interpretation on 11/06/2017 at 4:32 pm to Dr. Jola Schmidt , who verbally acknowledged these results. Electronically Signed   By: Titus Dubin M.D.   On: 11/06/2017 16:38    Microbiology: No results found for this or any previous visit (from the past 240 hour(s)).   Labs: Basic Metabolic Panel: Recent Labs  Lab 11/06/17 1534 11/07/17 0434  NA 138 142  K 3.9 3.5  CL 101 107  CO2 24 27  GLUCOSE 119* 113*  BUN 22 21  CREATININE 1.12* 1.11*  CALCIUM 9.7 8.7*   Liver Function Tests: No results for input(s): AST, ALT, ALKPHOS, BILITOT, PROT, ALBUMIN in the last 168 hours. No results for input(s): LIPASE, AMYLASE in the last 168 hours. No results for input(s): AMMONIA in the last 168 hours. CBC:  Recent Labs  Lab 11/06/17 1534  WBC 10.6*  HGB 14.9  HCT 46.5*  MCV 87.2  PLT 219   Cardiac Enzymes: No results for input(s): CKTOTAL, CKMB, CKMBINDEX, TROPONINI in the last 168 hours. BNP: BNP (last 3 results) No results for input(s): BNP in the last 8760 hours.  ProBNP (last 3 results) No results for input(s): PROBNP in the last 8760 hours.  CBG: No results for input(s): GLUCAP in the last 168 hours.     Signed:  Lelon Frohlich  [facility] Pager: [phone] 11/07/2017, 12:04 PM

## 2017-11-07 NOTE — Clinical Social Work Note (Signed)
Clinical Social Work Assessment  Patient Details  Name: Misty Patterson MRN: [id number] Date of Birth: [birth year]  Date of referral:  11/07/17               Reason for consult:  Facility Placement                Permission sought to share information with:  Facility Sport and exercise psychologist, Family Supports Permission granted to share information::  Yes, Verbal Permission Granted  Name::     Environmental education officer::  SNF  Relationship::  Son  Contact Information:     Housing/Transportation Living arrangements for the past 2 months:  Single Family Home Source of Information:  Patient, Adult Children, Medical Team Patient Interpreter Needed:  None Criminal Activity/Legal Involvement Pertinent to Current Situation/Hospitalization:  No - Comment as needed Significant Relationships:  Adult Children, Other Family Members, Pets Lives with:  Self Do you feel safe going back to the place where you live?  Yes Need for family participation in patient care:  Yes (Comment)  Care giving concerns:  Patient from home alone and family is unable to arrange 24 hour support for the patient when she returns home. Patient is a high fall risk (has fallen 12 times in the past year) and has broken her neck from recent falls. Patient will benefit from short term rehab to improve mobility and ability to independently return home.   Social Worker assessment / plan:  CSW met with patient and family at bedside, and also discussed with MD. CSW discussed PT recommendations, and family's ability to arrange support. CSW provided information to MD, that patient would be home alone as family is unable to arrange any support for during the day and patient is not safe to be home alone due to multiple falls and a broken neck. CSW to fax out referral and follow for DC to SNF.  Employment status:  Retired Nurse, adult PT Recommendations:  [place] / Referral to community resources:   [place]  Patient/Family's Response to care:  Patient and family agreeable to SNF placement.  Patient/Family's Understanding of and Emotional Response to Diagnosis, Current Treatment, and Prognosis:  Patient acknowledges that she's been falling a lot and has been trying to make her own accommodations at home to prevent falls, but she still keeps falling. Patient acknowledges that she's become weak and she just wants to get well. Patient's family acknowledged that she is unsafe at home on her own at this time but hopeful that she can get stronger and be safer to go home.  Emotional Assessment Appearance:  Appears stated age Attitude/Demeanor/Rapport:  Engaged Affect (typically observed):  Pleasant Orientation:  Oriented to Self, Oriented to Place, Oriented to  Time, Oriented to Situation Alcohol / Substance use:  Not Applicable Psych involvement (Current and /or in the community):  No (Comment)  Discharge Needs  Concerns to be addressed:  Care Coordination Readmission within the last 30 days:  No Current discharge risk:  Physical Impairment, Dependent with Mobility, Lives alone Barriers to Discharge:  Continued Medical Work up, [place], [place] 11/07/2017, 2:15 PM

## 2017-11-07 NOTE — Evaluation (Signed)
Occupational Therapy Evaluation Patient Details Name: Misty Patterson MRN: [id number] DOB: December 21, 1926 Today's Date: 11/07/2017    History of Present Illness  84 y.o. F with significant PMH of RLS, PNA, HTN, dementia, DVT not on anticoagulation presenting to ED after falling at home with neck pain. Found to have C2 unstable, nondisplaced fracture.   Clinical Impression   PT admitted with C2 unstable fx. Pt currently with functional limitiations due to the deficits listed below (see OT problem list). Pt with hx of falls and needs (A) with all adls. Pt with cognitive deficits.  Pt will benefit from skilled OT to increase their independence and safety with adls and balance to allow discharge SNF.     Follow Up Recommendations  SNF    Equipment Recommendations  3 in 1 bedside commode;Wheelchair (measurements OT);Wheelchair cushion (measurements OT);Hospital bed    Recommendations for Other Services       Precautions / Restrictions Precautions Precautions: Fall;Cervical Precaution Booklet Issued: No Required Braces or Orthoses: Cervical Brace Cervical Brace: Hard collar;At all times Restrictions Weight Bearing Restrictions: No      Mobility Bed Mobility Overal bed mobility: Needs Assistance Bed Mobility: Rolling;Sidelying to Sit;Sit to Sidelying Rolling: Min assist Sidelying to sit: Min assist     Sit to sidelying: Mod assist General bed mobility comments: pt with HOB elevated and helicopter to the eob. pt initially tolerating it well and no calling out. pt then reaching with bil ue for neck and becoming restless. the more agitiated the patient becomes the more she moved in the collar the more pain.   Transfers                 General transfer comment: Not attempted due to pain    Balance Overall balance assessment: Needs assistance Sitting-balance support: Bilateral upper extremity supported;Feet supported Sitting balance-Leahy Scale: Fair                                     ADL either performed or assessed with clinical judgement   ADL Overall ADL's : Needs assistance/impaired Eating/Feeding: Set up;Bed level Eating/Feeding Details (indicate cue type and reason): pt does not tolerate EOB raised well Grooming: Minimal assistance   Upper Body Bathing: Moderate assistance   Lower Body Bathing: Moderate assistance   Upper Body Dressing : Minimal assistance   Lower Body Dressing: Moderate assistance   Toilet Transfer: (na does not tolerate eob sitting well so not testd)             General ADL Comments: pt only tolerated EOB static sitting for less than 5 minutes. pt becoming more restless and throwing her upbody posteriorly and stating "I HAVE TO LAY BACK DOWN !"      Vision         Perception     Praxis      Pertinent Vitals/Pain Pain Assessment: Faces Faces Pain Scale: Hurts worst Pain Location: cervical Pain Descriptors / Indicators: Sharp Pain Intervention(s): Monitored during session;Premedicated before session;Repositioned;Limited activity within patient's tolerance     Hand Dominance Right   Extremity/Trunk Assessment Upper Extremity Assessment Upper Extremity Assessment: Overall WFL for tasks assessed   Lower Extremity Assessment Lower Extremity Assessment: Defer to PT evaluation   Cervical / Trunk Assessment Cervical / Trunk Assessment: Other exceptions Cervical / Trunk Exceptions: C2 fracture with Aspen collar   Communication Communication Communication: HOH   Cognition Arousal/Alertness: Awake/alert Behavior During  Therapy: WFL for tasks assessed/performed Overall Cognitive Status: History of cognitive impairments - at baseline                                 General Comments: pt easily distracted and family reporting that memory at this time is better than at other times during the day. pt with pain becomes very restless and not following commands   General Comments  total  (A) to correct adjust ccolllar    Exercises     Shoulder Instructions      Home Living Family/patient expects to be discharged to:: Private residence Living Arrangements: Alone;Other (Comment)(lives with son at night) Available Help at Discharge: Family Type of Home: House Home Access: Ramped entrance     Home Layout: One level     Bathroom Shower/Tub: Teacher, early years/pre: Standard     Home Equipment: Environmental consultant - 4 wheels   Additional Comments: son cooks cleans and takes care of x3 dogs. pt lets the small dog out on a leash so much lean over and place leash on the dog      Prior Functioning/Environment Level of Independence: Needs assistance  Gait / Transfers Assistance Needed: uses Rollator ADL's / Homemaking Assistance Needed: Son assists with IADL's. Pt independently sponge bathes.   Comments: Reports 12 falls in past year.        OT Problem List: Decreased strength;Decreased range of motion;Impaired balance (sitting and/or standing);Decreased activity tolerance;Decreased cognition;Decreased safety awareness;Decreased knowledge of use of DME or AE;Decreased knowledge of precautions;Pain      OT Treatment/Interventions: Self-care/ADL training;Therapeutic exercise;Energy conservation;Neuromuscular education;DME and/or AE instruction;Therapeutic activities;Patient/family education;Balance training    OT Goals(Current goals can be found in the care plan section) Acute Rehab OT Goals Patient Stated Goal: "go home and take care of my 3 dogs." OT Goal Formulation: With patient Time For Goal Achievement: 11/21/17 Potential to Achieve Goals: Good  OT Frequency: Min 2X/week   Barriers to D/C: Decreased caregiver support  home alone all day       Co-evaluation              AM-PAC PT "6 Clicks" Daily Activity     Outcome Measure Help from another person eating meals?: A Little Help from another person taking care of personal grooming?: A Lot Help  from another person toileting, which includes using toliet, bedpan, or urinal?: A Lot Help from another person bathing (including washing, rinsing, drying)?: A Lot Help from another person to put on and taking off regular upper body clothing?: A Lot Help from another person to put on and taking off regular lower body clothing?: A Lot 6 Click Score: 13   End of Session Equipment Utilized During Treatment: Cervical collar Nurse Communication: Mobility status;Precautions  Activity Tolerance: Patient limited by pain Patient left: in bed;with call bell/phone within reach;with family/visitor present;with bed alarm set  OT Visit Diagnosis: Unsteadiness on feet (R26.81);Repeated falls (R29.6);Muscle weakness (generalized) (M62.81)                Time: 3825-0539 OT Time Calculation (min): 17 min Charges:  OT General Charges $OT Visit: 1 Visit OT Evaluation $OT Eval Moderate Complexity: 1 Mod   Jeri Modena   OTR/L Pager: [phone] Office: [phone] .   Parke Poisson B 11/07/2017, 3:38 PM

## 2017-11-07 NOTE — NC FL2 (Signed)
[place] MEDICAID FL2 LEVEL OF CARE SCREENING TOOL     IDENTIFICATION  Patient Name: Misty Patterson Birthdate: 12/08/1926 Sex: female Admission Date (Current Location): 11/06/2017  [facility] and [place] Number:  Herbalist and Address:  [facility], [place] [address], [place], [place] [address]      Provider Number: [id number]  Attending Physician Name and Address:  Isaac Bliss, [place]  Relative Name and Phone Number:       Current Level of Care: Hospital Recommended Level of Care: [place] Prior Approval Number:    Date Approved/Denied:   PASRR Number: [id number]  Discharge Plan: SNF    Current Diagnoses: Patient Active Problem List   Diagnosis Date Noted  . C2 cervical fracture ([place]) 11/06/2017  . Fall at home, initial encounter 11/06/2017  . New onset atrial fibrillation ([place]) 11/06/2017  . Chronic obstructive pulmonary disease ([place]) 12/13/2016  . Acquired hypothyroidism 12/13/2016  . Recurrent major depressive disorder ([place]) 12/13/2016  . OAB (overactive bladder) 12/13/2016  . Mixed hyperlipidemia 12/13/2016  . Pain in joint involving multiple sites 05/12/2016  . High risk medication use 05/12/2016  . PAD (peripheral artery disease) ([place]) 05/12/2016  . History of deep vein thrombosis (DVT) of lower extremity 05/12/2016  . Myelopathy ([place]) 01/28/2015  . Chest pain 01/22/2015  . Abnormal ECG 01/22/2015  . Dementia 01/01/2015  . Cervical stenosis of spinal canal 01/01/2015  . Hereditary and idiopathic peripheral neuropathy 01/01/2015  . Radiculopathy 05/06/2014  . Pain of right lower leg 09/04/2013  . Paresthesia of both hands 09/04/2013  . Aftercare following surgery of the circulatory system, [place] 05/23/2013  . Muscle twitching-Right Leg 05/23/2013  . Peripheral vascular disease ([place]) 04/03/2013    Orientation RESPIRATION BLADDER Height & Weight     Self, Time, Situation, Place  Normal Incontinent  Weight: 66.5 kg Height:  5\' 6"  (167.6 cm)  BEHAVIORAL SYMPTOMS/MOOD NEUROLOGICAL BOWEL NUTRITION STATUS      Incontinent Diet(heart healthy)  AMBULATORY STATUS COMMUNICATION OF NEEDS Skin   Limited Assist Verbally Normal                       Personal Care Assistance Level of Assistance  Feeding, Dressing, Bathing Bathing Assistance: Maximum assistance Feeding assistance: Limited assistance Dressing Assistance: Limited assistance     Functional Limitations Info  Sight, Hearing, Speech Sight Info: Adequate Hearing Info: Impaired Speech Info: Adequate    SPECIAL CARE FACTORS FREQUENCY  PT (By licensed PT), OT (By licensed OT)(neck brace)     PT Frequency: PT 5x/wk OT Frequency: OT 5x/wk            Contractures Contractures Info: Not present    Additional Factors Info  Code Status, Allergies, Psychotropic Code Status Info: Full Allergies Info: ASPIRIN, FORMALDEHYDE, SODIUM PENTOBARBITAL PENTOBARBITAL Psychotropic Info: Valium 5mg  at bedtime, Aricept 10mg  daily, Neurontin 600mg  at bedtime, Namenda XR 28mg  daily, Zoloft 50mg  daily, Flexeril 5mg  TiD as needed         Current Medications (11/07/2017):  This is the current hospital active medication list Current Facility-Administered Medications  Medication Dose Route Frequency Provider Last Rate Last Dose  . 0.9 %  sodium chloride infusion  250 mL Intravenous PRN Roney Jaffe, MD      . acetaminophen (TYLENOL) tablet 650 mg  650 mg Oral Q6H PRN Roney Jaffe, MD   650 mg at 11/07/17 1015   Or  . acetaminophen (TYLENOL) suppository 650 mg  650 mg  Rectal Q6H PRN Roney Jaffe, MD      . amLODipine (NORVASC) tablet 5 mg  5 mg Oral BID Roney Jaffe, MD   5 mg at 11/07/17 1015  . B-complex with vitamin C tablet 1 tablet  1 tablet Oral Daily Roney Jaffe, MD   1 tablet at 11/07/17 1015  . bisacodyl (DULCOLAX) suppository 10 mg  10 mg Rectal Daily PRN Roney Jaffe, MD      . cholecalciferol (VITAMIN D)  tablet 1,000 Units  1,000 Units Oral Daily Roney Jaffe, MD   1,000 Units at 11/07/17 1015  . clopidogrel (PLAVIX) tablet 75 mg  75 mg Oral Daily Roney Jaffe, MD   75 mg at 11/07/17 1015  . cyclobenzaprine (FLEXERIL) tablet 5 mg  5 mg Oral TID PRN Roney Jaffe, MD   5 mg at 11/07/17 1418  . diazepam (VALIUM) tablet 5 mg  5 mg Oral QHS Roney Jaffe, MD   5 mg at 11/06/17 2101  . donepezil (ARICEPT) tablet 10 mg  10 mg Oral Daily Roney Jaffe, MD   10 mg at 11/07/17 1015  . gabapentin (NEURONTIN) tablet 600 mg  600 mg Oral QHS Roney Jaffe, MD   600 mg at 11/06/17 2102  . levothyroxine (SYNTHROID, LEVOTHROID) tablet 50 mcg  50 mcg Oral QAC breakfast Roney Jaffe, MD   50 mcg at 11/07/17 [id number]  . memantine (NAMENDA XR) 24 hr capsule 28 mg  28 mg Oral Daily Roney Jaffe, MD   28 mg at 11/07/17 1015  . metoprolol tartrate (LOPRESSOR) tablet 25 mg  25 mg Oral BID Roney Jaffe, MD   25 mg at 11/07/17 1015  . multivitamin with minerals tablet 1 tablet  1 tablet Oral Daily Roney Jaffe, MD   1 tablet at 11/07/17 1015  . omega-3 acid ethyl esters (LOVAZA) capsule 1 g  1 g Oral Daily Roney Jaffe, MD   1 g at 11/07/17 1016  . ondansetron (ZOFRAN) tablet 4 mg  4 mg Oral Q6H PRN Roney Jaffe, MD       Or  . ondansetron [facility]) injection 4 mg  4 mg Intravenous Q6H PRN Roney Jaffe, MD      . sertraline (ZOLOFT) tablet 50 mg  50 mg Oral Daily Roney Jaffe, MD   50 mg at 11/07/17 1015  . sodium chloride flush (NS) 0.9 % injection 3 mL  3 mL Intravenous Q12H Roney Jaffe, MD   3 mL at 11/07/17 1016  . sodium chloride flush (NS) 0.9 % injection 3 mL  3 mL Intravenous PRN Roney Jaffe, MD         Discharge Medications: Please see discharge summary for a list of discharge medications.  Relevant Imaging Results:  Relevant Lab Results:   Additional Information SS#: [id number]   Pollie Friar, RN

## 2017-11-08 DIAGNOSIS — Z743 Need for continuous supervision: Secondary | ICD-10-CM | POA: Diagnosis not present

## 2017-11-08 DIAGNOSIS — F339 Major depressive disorder, recurrent, unspecified: Secondary | ICD-10-CM | POA: Diagnosis not present

## 2017-11-08 DIAGNOSIS — Z86718 Personal history of other venous thrombosis and embolism: Secondary | ICD-10-CM | POA: Diagnosis not present

## 2017-11-08 DIAGNOSIS — G2581 Restless legs syndrome: Secondary | ICD-10-CM | POA: Diagnosis not present

## 2017-11-08 DIAGNOSIS — F419 Anxiety disorder, unspecified: Secondary | ICD-10-CM | POA: Diagnosis not present

## 2017-11-08 DIAGNOSIS — S129XXA Fracture of neck, unspecified, initial encounter: Secondary | ICD-10-CM | POA: Diagnosis not present

## 2017-11-08 DIAGNOSIS — R278 Other lack of coordination: Secondary | ICD-10-CM | POA: Diagnosis not present

## 2017-11-08 DIAGNOSIS — M6281 Muscle weakness (generalized): Secondary | ICD-10-CM | POA: Diagnosis not present

## 2017-11-08 DIAGNOSIS — F322 Major depressive disorder, single episode, severe without psychotic features: Secondary | ICD-10-CM | POA: Diagnosis not present

## 2017-11-08 DIAGNOSIS — M1990 Unspecified osteoarthritis, unspecified site: Secondary | ICD-10-CM | POA: Diagnosis not present

## 2017-11-08 DIAGNOSIS — I4891 Unspecified atrial fibrillation: Secondary | ICD-10-CM | POA: Diagnosis not present

## 2017-11-08 DIAGNOSIS — R279 Unspecified lack of coordination: Secondary | ICD-10-CM | POA: Diagnosis not present

## 2017-11-08 DIAGNOSIS — G63 Polyneuropathy in diseases classified elsewhere: Secondary | ICD-10-CM | POA: Diagnosis not present

## 2017-11-08 DIAGNOSIS — S12120A Other displaced dens fracture, initial encounter for closed fracture: Secondary | ICD-10-CM | POA: Diagnosis not present

## 2017-11-08 DIAGNOSIS — I480 Paroxysmal atrial fibrillation: Secondary | ICD-10-CM | POA: Diagnosis not present

## 2017-11-08 DIAGNOSIS — I10 Essential (primary) hypertension: Secondary | ICD-10-CM | POA: Diagnosis not present

## 2017-11-08 DIAGNOSIS — I739 Peripheral vascular disease, unspecified: Secondary | ICD-10-CM | POA: Diagnosis not present

## 2017-11-08 DIAGNOSIS — S12101A Unspecified nondisplaced fracture of second cervical vertebra, initial encounter for closed fracture: Secondary | ICD-10-CM | POA: Diagnosis not present

## 2017-11-08 DIAGNOSIS — S12191D Other nondisplaced fracture of second cervical vertebra, subsequent encounter for fracture with routine healing: Secondary | ICD-10-CM | POA: Diagnosis not present

## 2017-11-08 DIAGNOSIS — R1312 Dysphagia, oropharyngeal phase: Secondary | ICD-10-CM | POA: Diagnosis not present

## 2017-11-08 DIAGNOSIS — W19XXXA Unspecified fall, initial encounter: Secondary | ICD-10-CM | POA: Diagnosis present

## 2017-11-08 DIAGNOSIS — S12101D Unspecified nondisplaced fracture of second cervical vertebra, subsequent encounter for fracture with routine healing: Secondary | ICD-10-CM

## 2017-11-08 DIAGNOSIS — E039 Hypothyroidism, unspecified: Secondary | ICD-10-CM | POA: Diagnosis not present

## 2017-11-08 DIAGNOSIS — I158 Other secondary hypertension: Secondary | ICD-10-CM | POA: Diagnosis not present

## 2017-11-08 DIAGNOSIS — G609 Hereditary and idiopathic neuropathy, unspecified: Secondary | ICD-10-CM | POA: Diagnosis not present

## 2017-11-08 DIAGNOSIS — F0390 Unspecified dementia without behavioral disturbance: Secondary | ICD-10-CM | POA: Diagnosis not present

## 2017-11-08 DIAGNOSIS — R2689 Other abnormalities of gait and mobility: Secondary | ICD-10-CM | POA: Diagnosis not present

## 2017-11-08 DIAGNOSIS — I82409 Acute embolism and thrombosis of unspecified deep veins of unspecified lower extremity: Secondary | ICD-10-CM | POA: Diagnosis not present

## 2017-11-08 DIAGNOSIS — E038 Other specified hypothyroidism: Secondary | ICD-10-CM | POA: Diagnosis not present

## 2017-11-08 MED ORDER — TRAMADOL HCL 50 MG PO TABS
50.0000 mg | ORAL_TABLET | Freq: Four times a day (QID) | ORAL | Status: DC | PRN
  Administered 2017-11-08: 50 mg via ORAL
  Filled 2017-11-08: qty 1

## 2017-11-08 NOTE — Progress Notes (Signed)
Physical Therapy Treatment Patient Details Name: Misty Patterson MRN: [id number] DOB: 02/13/1927 Today's Date: 11/08/2017    History of Present Illness  84 y.o. F with significant PMH of RLS, PNA, HTN, dementia, DVT not on anticoagulation presenting to ED after falling at home with neck pain. Found to have C2 unstable, nondisplaced fracture.    PT Comments    Patient progressing well towards PT goals. Initiated gait training today with Min guard assist for safety. Limited mainly by pain with any movement. Educated pt on proper alignment of brace (adjusted for better fit) and to minimize cervical movement during mobility to decrease pain. Continues to require Min A for mobility at this time and activity limited due to pain. Easily distracted but redirectable. Appropriate for SNF. Will follow.   Follow Up Recommendations  SNF     Equipment Recommendations  None recommended by PT    Recommendations for Other Services       Precautions / Restrictions Precautions Precautions: Fall;Cervical Precaution Booklet Issued: No Required Braces or Orthoses: Cervical Brace Cervical Brace: Hard collar;At all times Restrictions Weight Bearing Restrictions: No    Mobility  Bed Mobility Overal bed mobility: Needs Assistance Bed Mobility: Rolling;Sidelying to Sit Rolling: Min guard Sidelying to sit: Min assist;HOB elevated       General bed mobility comments: Able to roll onto side with cues, assist to elevare trunk to get to EOB.   Transfers Overall transfer level: Needs assistance Equipment used: Rolling walker (2 wheeled) Transfers: Sit to/from Stand Sit to Stand: Min guard         General transfer comment: Min guard for safety with cues for hand placement. Yelping out in pain when moving neck. Transferred to chair post ambulation.  Ambulation/Gait Ambulation/Gait assistance: Min guard Gait Distance (Feet): 20 Feet Assistive device: Rolling walker (2 wheeled) Gait  Pattern/deviations: Step-through pattern;Decreased stride length Gait velocity: decreased   General Gait Details: Slow, guarded gait with use of RW. Cues to maintain cervical spine in alignment, adjusted brace for comfort/functionality.   Stairs             Wheelchair Mobility    Modified Rankin (Stroke Patients Only)       Balance Overall balance assessment: Needs assistance Sitting-balance support: Feet supported;Bilateral upper extremity supported Sitting balance-Leahy Scale: Fair Sitting balance - Comments: Able to sit EOB today but requires UE support as position of comfort.    Standing balance support: During functional activity;Bilateral upper extremity supported Standing balance-Leahy Scale: Poor Standing balance comment: Requires BUe support for dynamic standing.                             Cognition Arousal/Alertness: Awake/alert Behavior During Therapy: WFL for tasks assessed/performed Overall Cognitive Status: History of cognitive impairments - at baseline                                 General Comments: pt easily distracted but redirectable today.      Exercises      General Comments General comments (skin integrity, edema, etc.): Son present during session.      Pertinent Vitals/Pain Pain Assessment: Faces Faces Pain Scale: Hurts even more Pain Location: cervical Pain Descriptors / Indicators: Sharp;Moaning Pain Intervention(s): Monitored during session;Repositioned;Premedicated before session;Heat applied    Home Living  Prior Function            PT Goals (current goals can now be found in the care plan section) Progress towards PT goals: Progressing toward goals    Frequency    Min 3X/week      PT Plan Current plan remains appropriate    Co-evaluation              AM-PAC PT "6 Clicks" Daily Activity  Outcome Measure  Difficulty turning over in bed (including  adjusting bedclothes, sheets and blankets)?: None Difficulty moving from lying on back to sitting on the side of the bed? : Unable Difficulty sitting down on and standing up from a chair with arms (e.g., wheelchair, bedside commode, etc,.)?: A Little Help needed moving to and from a bed to chair (including a wheelchair)?: A Little Help needed walking in hospital room?: A Little Help needed climbing 3-5 steps with a railing? : A Lot 6 Click Score: 16    End of Session Equipment Utilized During Treatment: Cervical collar Activity Tolerance: Patient limited by pain;Patient tolerated treatment well Patient left: in chair;with call bell/phone within reach;with chair alarm set;with family/visitor present;with nursing/sitter in room Nurse Communication: Mobility status PT Visit Diagnosis: Pain;Other abnormalities of gait and mobility (R26.89) Pain - part of body: (neck)     Time: 1100-1118 PT Time Calculation (min) (ACUTE ONLY): 18 min  Charges:  $Therapeutic Activity: 8-22 mins                     [address], [place], [place] [phone]     Misty Patterson 11/08/2017, 12:21 PM

## 2017-11-08 NOTE — Care Management Obs Status (Signed)
MEDICARE OBSERVATION STATUS NOTIFICATION   Patient Details  Name: Misty Patterson MRN: [id number] Date of Birth: [birth year]   Medicare Observation Status Notification Given:  Yes    Pollie Friar, RN 11/08/2017, 3:07 PM

## 2017-11-08 NOTE — Discharge Summary (Signed)
Physician Discharge Summary  TRINA ASCH OYD:[id number] DOB: [birth year] DOA: 11/06/2017  PCP: Gildardo Cranker, DO  Admit date: 11/06/2017 Discharge date: 11/08/2017  Time spent: 45 minutes  Recommendations for Outpatient Follow-up:  -PT has recommended SNF. -Patient will DC to SNF once bed available and insurance authorization is complete. -Needs follow-up with Dr. Earnie Larsson with neurosurgery in 2 weeks for follow-up on her C2 cervical fracture.  Discharge Diagnoses:  Principal Problem:   C2 cervical fracture [facility]) Active Problems:   Peripheral vascular disease ([place])   Dementia   Fall at home, initial encounter   New onset atrial fibrillation [facility])   Discharge Condition: Stable and improved  Filed Weights   11/06/17 1958  Weight: 66.5 kg    History of present illness:  As per Dr. Jonnie Finner on 8/27: Misty Patterson is a 84 y.o. female w hx of RLS, PNA, HTN, dementia, DVT not on anticoagulation presented to ED after falling at home w/ neck pain.  Patient lives alone, her son stays with her at night, they live in [place].  They took her driver's license away about 2 mos ago, presumably for dementia.  She presented todady with neck pain w/ turning the head.  She suffered a fall from bed today.  She has hx of prior lumbar spine decompression surgery in Feb 2016 and then had C-spine decompression surgery in Nov 2016.  Patient was seen by neurosurgery in ED and diagnosed a C2 fracture, recommended immobilization in [place] braced and she can be mobilized w/ physical therapy and dc'd home when cleared medically and f/u w/ neurosurg in 2 wks.  We are asked to see for medical admission.   Patient has no c/o at this time, no SOB or CP, no abd pain, no n/v/d, no fevers, no paralysis of arms or legs.    Hospital Course:   Fall with a C2 cervical fracture -Seen by neurosurgery, recommends Aspen collar and follow-up in 2 weeks in the office. -Seen by physical therapy, who recommends  SNF. -Social work is aware, patient stable for discharge to SNF once bed available. - added Tramadol.   New onset atrial fibrillation -She is currently rate controlled, she is on Norvasc, metoprolol low-dose 25 mg twice daily was added on admission, she is tolerating this well. -She is most certainly not a candidate for anticoagulation given her advanced age, dementia and propensity to falls.  Hypertension -Systolics in the 672C to 947S, is on Norvasc at home, metoprolol has been added this admission.  Dementia -Not severe, continue Aricept, Namenda, she is starting to show signs of hospital delirium.  Hypothyroidism -Continue Synthroid.  Procedures:  None  Consultations:  Neurosurgery, Dr. Annette Stable  Discharge Instructions  Discharge Instructions    Diet - low sodium heart healthy   Complete by:  As directed    Diet - low sodium heart healthy   Complete by:  As directed    Discharge instructions   Complete by:  As directed    Follow up with PCP in one week.   Increase activity slowly   Complete by:  As directed      Allergies as of 11/08/2017      Reactions   Aspirin Other (See Comments)   REACTION:  Choking sensation, possible throat swelling   Formaldehyde Swelling   Tongue swelling   Sodium Pentobarbital [pentobarbital] Other (See Comments)   Memory Loss      Medication List    STOP taking these medications  traZODone 50 MG tablet Commonly known as:  DESYREL     TAKE these medications   acetaminophen-codeine 300-30 MG tablet Commonly known as:  TYLENOL #3 TAKE 1 TABLET BY MOUTH EVERY 8 HOURS AS NEEDED FOR PAIN What changed:    reasons to take this  additional instructions   amLODipine 5 MG tablet Commonly known as:  NORVASC Take 1 tablet by mouth twice daily.   B-complex with vitamin C tablet Take 1 tablet by mouth daily. Reported on 05/21/2015   cholecalciferol 1000 units tablet Commonly known as:  VITAMIN D Take 1,000 Units by mouth daily.     clopidogrel 75 MG tablet Commonly known as:  PLAVIX Take 75 mg by mouth daily.   cyclobenzaprine 5 MG tablet Commonly known as:  FLEXERIL Take 1 tablet (5 mg total) by mouth 3 (three) times daily as needed for muscle spasms.   diazepam 10 MG tablet Commonly known as:  VALIUM TAKE 1/2 TABLET BY MOUTH AT BEDTIME What changed:    how much to take  how to take this  when to take this  additional instructions   donepezil 10 MG tablet Commonly known as:  ARICEPT Take 10 mg by mouth daily.   gabapentin 600 MG tablet Commonly known as:  NEURONTIN Take 1 tablet (600 mg total) by mouth at bedtime.   levothyroxine 50 MCG tablet Commonly known as:  SYNTHROID, LEVOTHROID TAKE ONE TABLET BY MOUTH DAILY BEFORE BREAKFAST What changed:  See the new instructions.   memantine 28 MG Cp24 24 hr capsule Commonly known as:  NAMENDA XR Take 1 capsule (28 mg total) by mouth daily.   metoprolol tartrate 25 MG tablet Commonly known as:  LOPRESSOR Take 1 tablet (25 mg total) by mouth 2 (two) times daily.   multivitamin with minerals Tabs tablet Take 1 tablet by mouth daily.   Omega 3 1000 MG Caps Take 1,000 mg by mouth daily.   sertraline 50 MG tablet Commonly known as:  ZOLOFT Take 1 tablet by mouth daily.   traMADol 50 MG tablet Commonly known as:  ULTRAM Take 1 tablet (50 mg total) by mouth every 6 (six) hours as needed.      Allergies  Allergen Reactions  . Aspirin Other (See Comments)    REACTION:  Choking sensation, possible throat swelling  . Formaldehyde Swelling    Tongue swelling  . Sodium Pentobarbital [Pentobarbital] Other (See Comments)    Memory Loss   Follow-up Information    Earnie Larsson, MD. Schedule an appointment as soon as possible for a visit in 2 week(s).   Specialty:  Neurosurgery Contact information: [address] [place] [address] [place], [place], [place]. Schedule an appointment as soon as possible for a  visit in 1 week(s).   Specialty:  Internal Medicine Contact information: [place] [address] [phone]            The results of significant diagnostics from this hospitalization (including imaging, microbiology, ancillary and laboratory) are listed below for reference.    Significant Diagnostic Studies: Dg Chest 2 View  Result Date: 11/06/2017 CLINICAL DATA:  Recent fall with shortness of Breath EXAM: CHEST - 2 VIEW COMPARISON:  10/01/2017 FINDINGS: Cardiac shadow is stable. Aortic calcifications are again seen. Lungs are well aerated bilaterally. No acute bony abnormality is seen. No pleural effusion is noted. IMPRESSION: No active cardiopulmonary disease. Electronically Signed   By: Linus Mako.D.  On: 11/06/2017 16:19   Ct Head Wo Contrast  Result Date: 11/06/2017 CLINICAL DATA:  Hit right side of the head and neck after falling out of bed last night. EXAM: CT HEAD WITHOUT CONTRAST CT CERVICAL SPINE WITHOUT CONTRAST TECHNIQUE: Multidetector CT imaging of the head and cervical spine was performed following the standard protocol without intravenous contrast. Multiplanar CT image reconstructions of the cervical spine were also generated. COMPARISON:  CT head and cervical spine dated October 01, 2017. FINDINGS: CT HEAD FINDINGS Brain: No evidence of acute infarction, hemorrhage, hydrocephalus, extra-axial collection or mass lesion/mass effect. Stable atrophy and chronic microvascular ischemic change. Vascular: Calcified atherosclerosis at the skullbase. No hyperdense vessel. Skull: Negative for fracture or focal lesion. Sinuses/Orbits: No acute finding. Other: Small right frontal scalp hematoma. CT CERVICAL SPINE FINDINGS Alignment: Normal. Skull base and vertebrae: Acute, nondisplaced vertical fracture involving the body and lateral masses of C2. No extension into the transverse foramina. Minimal widening of the right C2-C3 facet joint. Prior C3-T1 posterior fusion and  C4-C6 posterior decompression. Soft tissues and spinal canal: No prevertebral fluid or swelling. No visible canal hematoma. Disc levels: Severe degenerative disc disease at T1-T2 is unchanged. Upper chest: Negative. Other: Bilateral carotid artery calcific atherosclerosis. Unchanged 9 mm hypodense nodule in the left thyroid lobe. IMPRESSION: 1. Unstable acute, nondisplaced vertical fracture involving the C2 body and lateral masses. 2. No acute intracranial abnormality. Small right frontal scalp hematoma. These results were called by telephone at the time of interpretation on 11/06/2017 at 4:32 pm to Dr. Jola Schmidt , who verbally acknowledged these results. Electronically Signed   By: Titus Dubin M.D.   On: 11/06/2017 16:38   Ct Cervical Spine Wo Contrast  Result Date: 11/06/2017 CLINICAL DATA:  Hit right side of the head and neck after falling out of bed last night. EXAM: CT HEAD WITHOUT CONTRAST CT CERVICAL SPINE WITHOUT CONTRAST TECHNIQUE: Multidetector CT imaging of the head and cervical spine was performed following the standard protocol without intravenous contrast. Multiplanar CT image reconstructions of the cervical spine were also generated. COMPARISON:  CT head and cervical spine dated October 01, 2017. FINDINGS: CT HEAD FINDINGS Brain: No evidence of acute infarction, hemorrhage, hydrocephalus, extra-axial collection or mass lesion/mass effect. Stable atrophy and chronic microvascular ischemic change. Vascular: Calcified atherosclerosis at the skullbase. No hyperdense vessel. Skull: Negative for fracture or focal lesion. Sinuses/Orbits: No acute finding. Other: Small right frontal scalp hematoma. CT CERVICAL SPINE FINDINGS Alignment: Normal. Skull base and vertebrae: Acute, nondisplaced vertical fracture involving the body and lateral masses of C2. No extension into the transverse foramina. Minimal widening of the right C2-C3 facet joint. Prior C3-T1 posterior fusion and C4-C6 posterior  decompression. Soft tissues and spinal canal: No prevertebral fluid or swelling. No visible canal hematoma. Disc levels: Severe degenerative disc disease at T1-T2 is unchanged. Upper chest: Negative. Other: Bilateral carotid artery calcific atherosclerosis. Unchanged 9 mm hypodense nodule in the left thyroid lobe. IMPRESSION: 1. Unstable acute, nondisplaced vertical fracture involving the C2 body and lateral masses. 2. No acute intracranial abnormality. Small right frontal scalp hematoma. These results were called by telephone at the time of interpretation on 11/06/2017 at 4:32 pm to Dr. Jola Schmidt , who verbally acknowledged these results. Electronically Signed   By: Titus Dubin M.D.   On: 11/06/2017 16:38    Microbiology: No results found for this or any previous visit (from the past 240 hour(s)).   Labs: Basic Metabolic Panel: Recent Labs  Lab 11/06/17 1534 11/07/17 0434  NA 138 142  K 3.9 3.5  CL 101 107  CO2 24 27  GLUCOSE 119* 113*  BUN 22 21  CREATININE 1.12* 1.11*  CALCIUM 9.7 8.7*   Liver Function Tests: No results for input(s): AST, ALT, ALKPHOS, BILITOT, PROT, ALBUMIN in the last 168 hours. No results for input(s): LIPASE, AMYLASE in the last 168 hours. No results for input(s): AMMONIA in the last 168 hours. CBC: Recent Labs  Lab 11/06/17 1534  WBC 10.6*  HGB 14.9  HCT 46.5*  MCV 87.2  PLT 219   Cardiac Enzymes: No results for input(s): CKTOTAL, CKMB, CKMBINDEX, TROPONINI in the last 168 hours. BNP: BNP (last 3 results) No results for input(s): BNP in the last 8760 hours.  ProBNP (last 3 results) No results for input(s): PROBNP in the last 8760 hours.  CBG: No results for input(s): GLUCAP in the last 168 hours.     Signed:  Hosie Poisson  [facility] Pager: [phone] 11/08/2017, 11:29 AM

## 2017-11-08 NOTE — Clinical Social Work Placement (Signed)
Nurse to call report to [phone], Room [place]  NOTE  Date:  11/08/2017  Patient Details  Name: Misty Patterson MRN: [id number] Date of Birth: 08-20-1926  Clinical Social Work is seeking post-discharge placement for this patient at the [place] level of care (*CSW will initial, date and re-position this form in  chart as items are completed):  Yes   Patient/family provided with [place] Work Department's list of facilities offering this level of care within the geographic area requested by the patient (or if unable, by the patient's family).  Yes   Patient/family informed of their freedom to choose among providers that offer the needed level of care, that participate in Medicare, Medicaid or managed care program needed by the patient, have an available bed and are willing to accept the patient.  Yes   Patient/family informed of [place]'s ownership interest in [facility] and [facility], as well as of the fact that they are under no obligation to receive care at these facilities.  PASRR submitted to EDS on 11/07/17     PASRR number received on       Existing PASRR number confirmed on 11/07/17     FL2 transmitted to all facilities in geographic area requested by pt/family on 11/07/17     FL2 transmitted to all facilities within larger geographic area on       Patient informed that his/her managed care company has contracts with or will negotiate with certain facilities, including the following:        Yes   Patient/family informed of bed offers received.  Patient chooses bed at [facility]     Physician recommends and patient chooses bed at      Patient to be transferred to [facility] on 11/08/17.  Patient to be transferred to facility by [facility]     Patient family notified on 11/08/17 of transfer.  Name of family member notified:  Merry Proud     PHYSICIAN        Additional Comment:    _______________________________________________ Geralynn Ochs, [place] 11/08/2017, 3:33 PM

## 2017-11-08 NOTE — Progress Notes (Signed)
Pt being discharged from hospital per orders from MD to SNF. Pt educated on discharge instructions. Pt verbalized understanding of instructions. All questions and concerns were addressed. Pt's IV was removed prior to discharge. Pt exited hospital via stretcher accompanied by transport team.

## 2017-11-08 NOTE — Care Management CC44 (Signed)
Condition Code 44 Documentation Completed  Patient Details  Name: Misty Patterson MRN: [id number] Date of Birth: 04/26/1926   Condition Code 44 given:  Yes Patient signature on Condition Code 44 notice:  Yes Documentation of 2 MD's agreement:  Yes Code 44 added to claim:  Yes    Pollie Friar, RN 11/08/2017, 3:08 PM

## 2017-11-09 ENCOUNTER — Telehealth: Payer: Self-pay

## 2017-11-09 DIAGNOSIS — I739 Peripheral vascular disease, unspecified: Secondary | ICD-10-CM | POA: Diagnosis not present

## 2017-11-09 DIAGNOSIS — I480 Paroxysmal atrial fibrillation: Secondary | ICD-10-CM | POA: Diagnosis not present

## 2017-11-09 DIAGNOSIS — S12191D Other nondisplaced fracture of second cervical vertebra, subsequent encounter for fracture with routine healing: Secondary | ICD-10-CM | POA: Diagnosis not present

## 2017-11-09 DIAGNOSIS — G63 Polyneuropathy in diseases classified elsewhere: Secondary | ICD-10-CM | POA: Diagnosis not present

## 2017-11-09 NOTE — Telephone Encounter (Signed)
Called to do TCM call and schedule office appointment. I was informed by her son that she is currently at [facility] rehab

## 2017-11-09 NOTE — Telephone Encounter (Signed)
I have made the 1st attempt to contact the patient or family member in charge, in order to follow up from recently being discharged from the hospital. I left a message on voicemail but I will make another attempt at a different time.  

## 2017-11-09 NOTE — Consult Note (Signed)
            [facility] CM Primary Care Navigator  11/09/2017  Misty Patterson 11-13-1926 [id number]   Attempt to see patientat the bedside to identify possible discharge needs butshe wasalreadydischarge perstaff.  Patientwas discharged to skilled nursing facility (SNF) per therapy recommendation.  She was admitted after falling at home with neck pain.  Primary care provider's office is listed as providing transition of care (TOC) follow-up.  Patient has discharge instruction to follow-up withprimary care provider in 1 week and neurosurgery follow-up in 2 weeks.   For additional questions please contact:  Edwena Felty A. Alva Broxson, BSN, RN-BC [facility] PRIMARY CARE Navigator Cell: [phone]

## 2017-11-16 DIAGNOSIS — E038 Other specified hypothyroidism: Secondary | ICD-10-CM | POA: Diagnosis not present

## 2017-11-16 DIAGNOSIS — S12191D Other nondisplaced fracture of second cervical vertebra, subsequent encounter for fracture with routine healing: Secondary | ICD-10-CM | POA: Diagnosis not present

## 2017-11-16 DIAGNOSIS — I480 Paroxysmal atrial fibrillation: Secondary | ICD-10-CM | POA: Diagnosis not present

## 2017-11-16 DIAGNOSIS — Z86718 Personal history of other venous thrombosis and embolism: Secondary | ICD-10-CM | POA: Diagnosis not present

## 2017-11-22 DIAGNOSIS — S12120A Other displaced dens fracture, initial encounter for closed fracture: Secondary | ICD-10-CM | POA: Diagnosis not present

## 2017-11-23 ENCOUNTER — Other Ambulatory Visit: Payer: Self-pay | Admitting: Internal Medicine

## 2017-11-23 DIAGNOSIS — G63 Polyneuropathy in diseases classified elsewhere: Secondary | ICD-10-CM | POA: Diagnosis not present

## 2017-11-23 DIAGNOSIS — I10 Essential (primary) hypertension: Secondary | ICD-10-CM | POA: Diagnosis not present

## 2017-11-23 DIAGNOSIS — I480 Paroxysmal atrial fibrillation: Secondary | ICD-10-CM | POA: Diagnosis not present

## 2017-11-23 DIAGNOSIS — S12191D Other nondisplaced fracture of second cervical vertebra, subsequent encounter for fracture with routine healing: Secondary | ICD-10-CM | POA: Diagnosis not present

## 2017-11-28 ENCOUNTER — Other Ambulatory Visit: Payer: Self-pay | Admitting: *Deleted

## 2017-11-28 NOTE — Patient Outreach (Signed)
[facility]) Care Management  11/28/2017  Misty Patterson 1927/02/02 [id number]   Onsite IDT meeting at [facility], SW at facility, she reports that they are unable to reach son, Misty Patterson about discharge plan.   Met with patient. She still has [facility] care management packet in her room that RNCM left at last facility visit.  Someone had written "Misty Patterson" on packet, patient does not know who Misty Patterson is and unsure why it is on the packet. She will give the packet to her son when he visits.  She does not know when that will be.   Will attempt to reach [facility] and follow up with [facility] care management Will follow up with Misty Patterson, SW at facility WIll collaborate with [facility].   Misty Patterson. Misty Purser, RN, BSN, [place] [phone]) Business Cell  [phone]) Toll Free Office

## 2017-11-29 ENCOUNTER — Other Ambulatory Visit: Payer: Self-pay | Admitting: *Deleted

## 2017-11-29 NOTE — Patient Outreach (Signed)
Outreach call to patient son to follow up on patient discharge plan. Patient is at [facility] and Vickii Chafe the SW has not been able to reach son to get a clear picture of the plan for patient.   Unable to reach [place] at [phone], and unable to leave a message as the voicemail box was not set up to accept messages  Will update [facility] and Ritta Slot SW that this RNCM was unable to contact son.  Royetta Crochet. Laymond Purser, RN, BSN, [place] [phone]) Business Cell  [phone]) Toll Free Office

## 2017-11-30 DIAGNOSIS — Z86718 Personal history of other venous thrombosis and embolism: Secondary | ICD-10-CM | POA: Diagnosis not present

## 2017-11-30 DIAGNOSIS — S12191D Other nondisplaced fracture of second cervical vertebra, subsequent encounter for fracture with routine healing: Secondary | ICD-10-CM | POA: Diagnosis not present

## 2017-11-30 DIAGNOSIS — I480 Paroxysmal atrial fibrillation: Secondary | ICD-10-CM | POA: Diagnosis not present

## 2017-11-30 DIAGNOSIS — I10 Essential (primary) hypertension: Secondary | ICD-10-CM | POA: Diagnosis not present

## 2017-12-05 ENCOUNTER — Other Ambulatory Visit: Payer: Self-pay | Admitting: *Deleted

## 2017-12-05 NOTE — Patient Outreach (Signed)
[facility]) Care Management  12/05/2017  Misty Patterson 1926/10/17 [id number]  Onsite IDT meeting.   This RNCM and SW and staff at facility have been unable to reach patient son to confirm patient discharge plan.  SW plans to meet with SNF admin Misty Patterson and get APS involved for abandonment. Therapy agrees that patient would not be able to go home without assistance.  Patient will probably be LTC.  Plan to monitor for any [facility] care management needs at discharge, no care management needs anticipated due to tentative plan of LTC.  Royetta Crochet. Laymond Purser, RN, BSN, [place] [phone]) Business Cell  [phone]) Toll Free Office

## 2017-12-11 DIAGNOSIS — I480 Paroxysmal atrial fibrillation: Secondary | ICD-10-CM | POA: Diagnosis not present

## 2017-12-11 DIAGNOSIS — E038 Other specified hypothyroidism: Secondary | ICD-10-CM | POA: Diagnosis not present

## 2017-12-11 DIAGNOSIS — I10 Essential (primary) hypertension: Secondary | ICD-10-CM | POA: Diagnosis not present

## 2017-12-11 DIAGNOSIS — S12191D Other nondisplaced fracture of second cervical vertebra, subsequent encounter for fracture with routine healing: Secondary | ICD-10-CM | POA: Diagnosis not present

## 2017-12-12 ENCOUNTER — Other Ambulatory Visit: Payer: Self-pay | Admitting: *Deleted

## 2017-12-12 NOTE — Patient Outreach (Signed)
[facility]) Care Management  12/12/2017  YANIA BOGIE 08-15-1926 [id number]   Confirmed with [facility], patient will remain as LTC patient, and will not be discharging. No [facility] Care management needs identified.  Royetta Crochet. Laymond Purser, RN, BSN, [place] [phone]) Business Cell  [phone]) Toll Free Office

## 2017-12-13 DIAGNOSIS — S12120A Other displaced dens fracture, initial encounter for closed fracture: Secondary | ICD-10-CM | POA: Diagnosis not present

## 2017-12-14 DIAGNOSIS — R278 Other lack of coordination: Secondary | ICD-10-CM | POA: Diagnosis not present

## 2017-12-14 DIAGNOSIS — F339 Major depressive disorder, recurrent, unspecified: Secondary | ICD-10-CM | POA: Diagnosis not present

## 2017-12-14 DIAGNOSIS — I4891 Unspecified atrial fibrillation: Secondary | ICD-10-CM | POA: Diagnosis not present

## 2017-12-14 DIAGNOSIS — R2689 Other abnormalities of gait and mobility: Secondary | ICD-10-CM | POA: Diagnosis not present

## 2017-12-14 DIAGNOSIS — M6281 Muscle weakness (generalized): Secondary | ICD-10-CM | POA: Diagnosis not present

## 2017-12-14 DIAGNOSIS — G2581 Restless legs syndrome: Secondary | ICD-10-CM | POA: Diagnosis not present

## 2017-12-14 DIAGNOSIS — F419 Anxiety disorder, unspecified: Secondary | ICD-10-CM | POA: Diagnosis not present

## 2017-12-14 DIAGNOSIS — S129XXA Fracture of neck, unspecified, initial encounter: Secondary | ICD-10-CM | POA: Diagnosis not present

## 2017-12-14 DIAGNOSIS — E039 Hypothyroidism, unspecified: Secondary | ICD-10-CM | POA: Diagnosis not present

## 2017-12-14 DIAGNOSIS — I158 Other secondary hypertension: Secondary | ICD-10-CM | POA: Diagnosis not present

## 2017-12-14 DIAGNOSIS — E46 Unspecified protein-calorie malnutrition: Secondary | ICD-10-CM | POA: Diagnosis not present

## 2017-12-14 DIAGNOSIS — G609 Hereditary and idiopathic neuropathy, unspecified: Secondary | ICD-10-CM | POA: Diagnosis not present

## 2017-12-14 DIAGNOSIS — I82409 Acute embolism and thrombosis of unspecified deep veins of unspecified lower extremity: Secondary | ICD-10-CM | POA: Diagnosis not present

## 2017-12-14 DIAGNOSIS — R1312 Dysphagia, oropharyngeal phase: Secondary | ICD-10-CM | POA: Diagnosis not present

## 2017-12-14 DIAGNOSIS — I10 Essential (primary) hypertension: Secondary | ICD-10-CM | POA: Diagnosis not present

## 2017-12-14 DIAGNOSIS — F0390 Unspecified dementia without behavioral disturbance: Secondary | ICD-10-CM | POA: Diagnosis not present

## 2017-12-14 DIAGNOSIS — I739 Peripheral vascular disease, unspecified: Secondary | ICD-10-CM | POA: Diagnosis not present

## 2017-12-20 DIAGNOSIS — I480 Paroxysmal atrial fibrillation: Secondary | ICD-10-CM | POA: Diagnosis not present

## 2017-12-20 DIAGNOSIS — S12191D Other nondisplaced fracture of second cervical vertebra, subsequent encounter for fracture with routine healing: Secondary | ICD-10-CM | POA: Diagnosis not present

## 2017-12-20 DIAGNOSIS — E038 Other specified hypothyroidism: Secondary | ICD-10-CM | POA: Diagnosis not present

## 2017-12-20 DIAGNOSIS — I10 Essential (primary) hypertension: Secondary | ICD-10-CM | POA: Diagnosis not present

## 2017-12-21 ENCOUNTER — Other Ambulatory Visit: Payer: Self-pay | Admitting: Internal Medicine

## 2017-12-26 ENCOUNTER — Ambulatory Visit: Payer: PPO | Admitting: Internal Medicine

## 2017-12-27 DIAGNOSIS — S12191D Other nondisplaced fracture of second cervical vertebra, subsequent encounter for fracture with routine healing: Secondary | ICD-10-CM | POA: Diagnosis not present

## 2017-12-27 DIAGNOSIS — E038 Other specified hypothyroidism: Secondary | ICD-10-CM | POA: Diagnosis not present

## 2017-12-27 DIAGNOSIS — I739 Peripheral vascular disease, unspecified: Secondary | ICD-10-CM | POA: Diagnosis not present

## 2017-12-27 DIAGNOSIS — I10 Essential (primary) hypertension: Secondary | ICD-10-CM | POA: Diagnosis not present

## 2018-01-08 DIAGNOSIS — S129XXA Fracture of neck, unspecified, initial encounter: Secondary | ICD-10-CM | POA: Diagnosis not present

## 2018-01-08 DIAGNOSIS — I10 Essential (primary) hypertension: Secondary | ICD-10-CM | POA: Diagnosis not present

## 2018-01-08 DIAGNOSIS — I739 Peripheral vascular disease, unspecified: Secondary | ICD-10-CM | POA: Diagnosis not present

## 2018-01-08 DIAGNOSIS — E039 Hypothyroidism, unspecified: Secondary | ICD-10-CM | POA: Diagnosis not present

## 2018-01-08 DIAGNOSIS — F322 Major depressive disorder, single episode, severe without psychotic features: Secondary | ICD-10-CM | POA: Diagnosis not present

## 2018-01-08 DIAGNOSIS — G2581 Restless legs syndrome: Secondary | ICD-10-CM | POA: Diagnosis not present

## 2018-01-08 DIAGNOSIS — F0390 Unspecified dementia without behavioral disturbance: Secondary | ICD-10-CM | POA: Diagnosis not present

## 2018-01-21 ENCOUNTER — Other Ambulatory Visit: Payer: Self-pay | Admitting: *Deleted

## 2018-01-21 MED ORDER — SERTRALINE HCL 50 MG PO TABS: 50.0000 mg | ORAL_TABLET | Freq: Every day | ORAL | 0 refills | Status: AC

## 2018-01-21 NOTE — Telephone Encounter (Signed)
[place]

## 2018-01-23 DIAGNOSIS — E038 Other specified hypothyroidism: Secondary | ICD-10-CM | POA: Diagnosis not present

## 2018-01-23 DIAGNOSIS — Z86718 Personal history of other venous thrombosis and embolism: Secondary | ICD-10-CM | POA: Diagnosis not present

## 2018-01-23 DIAGNOSIS — I10 Essential (primary) hypertension: Secondary | ICD-10-CM | POA: Diagnosis not present

## 2018-01-23 DIAGNOSIS — R05 Cough: Secondary | ICD-10-CM | POA: Diagnosis not present

## 2018-01-31 DIAGNOSIS — E039 Hypothyroidism, unspecified: Secondary | ICD-10-CM | POA: Diagnosis not present

## 2018-01-31 DIAGNOSIS — E46 Unspecified protein-calorie malnutrition: Secondary | ICD-10-CM | POA: Diagnosis not present

## 2018-01-31 DIAGNOSIS — I10 Essential (primary) hypertension: Secondary | ICD-10-CM | POA: Diagnosis not present

## 2018-01-31 DIAGNOSIS — I82409 Acute embolism and thrombosis of unspecified deep veins of unspecified lower extremity: Secondary | ICD-10-CM | POA: Diagnosis not present

## 2018-01-31 DIAGNOSIS — F0391 Unspecified dementia with behavioral disturbance: Secondary | ICD-10-CM | POA: Diagnosis not present

## 2018-01-31 DIAGNOSIS — G8929 Other chronic pain: Secondary | ICD-10-CM | POA: Diagnosis not present

## 2018-01-31 DIAGNOSIS — G2581 Restless legs syndrome: Secondary | ICD-10-CM | POA: Diagnosis not present

## 2018-01-31 DIAGNOSIS — I4891 Unspecified atrial fibrillation: Secondary | ICD-10-CM | POA: Diagnosis not present

## 2018-02-08 DIAGNOSIS — F339 Major depressive disorder, recurrent, unspecified: Secondary | ICD-10-CM | POA: Diagnosis not present

## 2018-02-08 DIAGNOSIS — G609 Hereditary and idiopathic neuropathy, unspecified: Secondary | ICD-10-CM | POA: Diagnosis not present

## 2018-02-08 DIAGNOSIS — R1312 Dysphagia, oropharyngeal phase: Secondary | ICD-10-CM | POA: Diagnosis not present

## 2018-02-08 DIAGNOSIS — F0390 Unspecified dementia without behavioral disturbance: Secondary | ICD-10-CM | POA: Diagnosis not present

## 2018-02-08 DIAGNOSIS — F419 Anxiety disorder, unspecified: Secondary | ICD-10-CM | POA: Diagnosis not present

## 2018-02-08 DIAGNOSIS — R278 Other lack of coordination: Secondary | ICD-10-CM | POA: Diagnosis not present

## 2018-02-08 DIAGNOSIS — I4891 Unspecified atrial fibrillation: Secondary | ICD-10-CM | POA: Diagnosis not present

## 2018-02-08 DIAGNOSIS — R2689 Other abnormalities of gait and mobility: Secondary | ICD-10-CM | POA: Diagnosis not present

## 2018-02-08 DIAGNOSIS — E039 Hypothyroidism, unspecified: Secondary | ICD-10-CM | POA: Diagnosis not present

## 2018-02-08 DIAGNOSIS — M6281 Muscle weakness (generalized): Secondary | ICD-10-CM | POA: Diagnosis not present

## 2018-02-08 DIAGNOSIS — I158 Other secondary hypertension: Secondary | ICD-10-CM | POA: Diagnosis not present

## 2018-02-08 DIAGNOSIS — I739 Peripheral vascular disease, unspecified: Secondary | ICD-10-CM | POA: Diagnosis not present

## 2018-02-11 DIAGNOSIS — I158 Other secondary hypertension: Secondary | ICD-10-CM | POA: Diagnosis not present

## 2018-02-11 DIAGNOSIS — E039 Hypothyroidism, unspecified: Secondary | ICD-10-CM | POA: Diagnosis not present

## 2018-02-11 DIAGNOSIS — F0390 Unspecified dementia without behavioral disturbance: Secondary | ICD-10-CM | POA: Diagnosis not present

## 2018-02-11 DIAGNOSIS — R2689 Other abnormalities of gait and mobility: Secondary | ICD-10-CM | POA: Diagnosis not present

## 2018-02-11 DIAGNOSIS — I4891 Unspecified atrial fibrillation: Secondary | ICD-10-CM | POA: Diagnosis not present

## 2018-02-11 DIAGNOSIS — R278 Other lack of coordination: Secondary | ICD-10-CM | POA: Diagnosis not present

## 2018-02-11 DIAGNOSIS — R1312 Dysphagia, oropharyngeal phase: Secondary | ICD-10-CM | POA: Diagnosis not present

## 2018-02-11 DIAGNOSIS — F339 Major depressive disorder, recurrent, unspecified: Secondary | ICD-10-CM | POA: Diagnosis not present

## 2018-02-11 DIAGNOSIS — G609 Hereditary and idiopathic neuropathy, unspecified: Secondary | ICD-10-CM | POA: Diagnosis not present

## 2018-02-11 DIAGNOSIS — M6281 Muscle weakness (generalized): Secondary | ICD-10-CM | POA: Diagnosis not present

## 2018-02-11 DIAGNOSIS — I739 Peripheral vascular disease, unspecified: Secondary | ICD-10-CM | POA: Diagnosis not present

## 2018-02-11 DIAGNOSIS — F419 Anxiety disorder, unspecified: Secondary | ICD-10-CM | POA: Diagnosis not present

## 2018-03-05 DIAGNOSIS — I739 Peripheral vascular disease, unspecified: Secondary | ICD-10-CM | POA: Diagnosis not present

## 2018-03-05 DIAGNOSIS — I4891 Unspecified atrial fibrillation: Secondary | ICD-10-CM | POA: Diagnosis not present

## 2018-03-05 DIAGNOSIS — I10 Essential (primary) hypertension: Secondary | ICD-10-CM | POA: Diagnosis not present

## 2018-03-05 DIAGNOSIS — F322 Major depressive disorder, single episode, severe without psychotic features: Secondary | ICD-10-CM | POA: Diagnosis not present

## 2018-03-05 DIAGNOSIS — F0391 Unspecified dementia with behavioral disturbance: Secondary | ICD-10-CM | POA: Diagnosis not present

## 2018-03-05 DIAGNOSIS — M5090 Cervical disc disorder, unspecified, unspecified cervical region: Secondary | ICD-10-CM | POA: Diagnosis not present

## 2018-03-05 DIAGNOSIS — E039 Hypothyroidism, unspecified: Secondary | ICD-10-CM | POA: Diagnosis not present

## 2018-03-05 DIAGNOSIS — G2581 Restless legs syndrome: Secondary | ICD-10-CM | POA: Diagnosis not present

## 2018-03-05 DIAGNOSIS — M519 Unspecified thoracic, thoracolumbar and lumbosacral intervertebral disc disorder: Secondary | ICD-10-CM | POA: Diagnosis not present

## 2018-03-05 DIAGNOSIS — E46 Unspecified protein-calorie malnutrition: Secondary | ICD-10-CM | POA: Diagnosis not present

## 2018-04-04 DIAGNOSIS — E0590 Thyrotoxicosis, unspecified without thyrotoxic crisis or storm: Secondary | ICD-10-CM | POA: Diagnosis not present

## 2018-04-04 DIAGNOSIS — F0390 Unspecified dementia without behavioral disturbance: Secondary | ICD-10-CM | POA: Diagnosis not present

## 2018-04-04 DIAGNOSIS — I739 Peripheral vascular disease, unspecified: Secondary | ICD-10-CM | POA: Diagnosis not present

## 2018-04-04 DIAGNOSIS — M5090 Cervical disc disorder, unspecified, unspecified cervical region: Secondary | ICD-10-CM | POA: Diagnosis not present

## 2018-04-04 DIAGNOSIS — E46 Unspecified protein-calorie malnutrition: Secondary | ICD-10-CM | POA: Diagnosis not present

## 2018-04-04 DIAGNOSIS — G2581 Restless legs syndrome: Secondary | ICD-10-CM | POA: Diagnosis not present

## 2018-04-04 DIAGNOSIS — M1990 Unspecified osteoarthritis, unspecified site: Secondary | ICD-10-CM | POA: Diagnosis not present

## 2018-04-04 DIAGNOSIS — I4891 Unspecified atrial fibrillation: Secondary | ICD-10-CM | POA: Diagnosis not present

## 2018-04-09 DIAGNOSIS — I480 Paroxysmal atrial fibrillation: Secondary | ICD-10-CM | POA: Diagnosis not present

## 2018-04-09 DIAGNOSIS — R05 Cough: Secondary | ICD-10-CM | POA: Diagnosis not present

## 2018-04-09 DIAGNOSIS — I10 Essential (primary) hypertension: Secondary | ICD-10-CM | POA: Diagnosis not present

## 2018-04-09 DIAGNOSIS — I739 Peripheral vascular disease, unspecified: Secondary | ICD-10-CM | POA: Diagnosis not present

## 2018-04-24 DIAGNOSIS — R2689 Other abnormalities of gait and mobility: Secondary | ICD-10-CM | POA: Diagnosis not present

## 2018-04-24 DIAGNOSIS — E039 Hypothyroidism, unspecified: Secondary | ICD-10-CM | POA: Diagnosis not present

## 2018-04-24 DIAGNOSIS — I4891 Unspecified atrial fibrillation: Secondary | ICD-10-CM | POA: Diagnosis not present

## 2018-04-24 DIAGNOSIS — R278 Other lack of coordination: Secondary | ICD-10-CM | POA: Diagnosis not present

## 2018-04-24 DIAGNOSIS — F419 Anxiety disorder, unspecified: Secondary | ICD-10-CM | POA: Diagnosis not present

## 2018-04-24 DIAGNOSIS — F339 Major depressive disorder, recurrent, unspecified: Secondary | ICD-10-CM | POA: Diagnosis not present

## 2018-04-24 DIAGNOSIS — M6281 Muscle weakness (generalized): Secondary | ICD-10-CM | POA: Diagnosis not present

## 2018-04-24 DIAGNOSIS — I739 Peripheral vascular disease, unspecified: Secondary | ICD-10-CM | POA: Diagnosis not present

## 2018-04-24 DIAGNOSIS — F0390 Unspecified dementia without behavioral disturbance: Secondary | ICD-10-CM | POA: Diagnosis not present

## 2018-04-24 DIAGNOSIS — G609 Hereditary and idiopathic neuropathy, unspecified: Secondary | ICD-10-CM | POA: Diagnosis not present

## 2018-04-24 DIAGNOSIS — R1312 Dysphagia, oropharyngeal phase: Secondary | ICD-10-CM | POA: Diagnosis not present

## 2018-04-24 DIAGNOSIS — I158 Other secondary hypertension: Secondary | ICD-10-CM | POA: Diagnosis not present

## 2018-05-02 DIAGNOSIS — I10 Essential (primary) hypertension: Secondary | ICD-10-CM | POA: Diagnosis not present

## 2018-05-02 DIAGNOSIS — I480 Paroxysmal atrial fibrillation: Secondary | ICD-10-CM | POA: Diagnosis not present

## 2018-05-02 DIAGNOSIS — F0390 Unspecified dementia without behavioral disturbance: Secondary | ICD-10-CM | POA: Diagnosis not present

## 2018-05-02 DIAGNOSIS — W19XXXD Unspecified fall, subsequent encounter: Secondary | ICD-10-CM | POA: Diagnosis not present

## 2018-05-03 DIAGNOSIS — F411 Generalized anxiety disorder: Secondary | ICD-10-CM | POA: Diagnosis not present

## 2018-05-03 DIAGNOSIS — I4891 Unspecified atrial fibrillation: Secondary | ICD-10-CM | POA: Diagnosis not present

## 2018-05-03 DIAGNOSIS — I82409 Acute embolism and thrombosis of unspecified deep veins of unspecified lower extremity: Secondary | ICD-10-CM | POA: Diagnosis not present

## 2018-05-03 DIAGNOSIS — F0151 Vascular dementia with behavioral disturbance: Secondary | ICD-10-CM | POA: Diagnosis not present

## 2018-05-03 DIAGNOSIS — E039 Hypothyroidism, unspecified: Secondary | ICD-10-CM | POA: Diagnosis not present

## 2018-05-03 DIAGNOSIS — M5090 Cervical disc disorder, unspecified, unspecified cervical region: Secondary | ICD-10-CM | POA: Diagnosis not present

## 2018-05-03 DIAGNOSIS — I10 Essential (primary) hypertension: Secondary | ICD-10-CM | POA: Diagnosis not present

## 2018-05-03 DIAGNOSIS — I739 Peripheral vascular disease, unspecified: Secondary | ICD-10-CM | POA: Diagnosis not present

## 2018-05-13 DIAGNOSIS — R1312 Dysphagia, oropharyngeal phase: Secondary | ICD-10-CM | POA: Diagnosis not present

## 2018-05-13 DIAGNOSIS — F0390 Unspecified dementia without behavioral disturbance: Secondary | ICD-10-CM | POA: Diagnosis not present

## 2018-05-13 DIAGNOSIS — I158 Other secondary hypertension: Secondary | ICD-10-CM | POA: Diagnosis not present

## 2018-05-13 DIAGNOSIS — M6281 Muscle weakness (generalized): Secondary | ICD-10-CM | POA: Diagnosis not present

## 2018-05-13 DIAGNOSIS — E039 Hypothyroidism, unspecified: Secondary | ICD-10-CM | POA: Diagnosis not present

## 2018-05-13 DIAGNOSIS — I4891 Unspecified atrial fibrillation: Secondary | ICD-10-CM | POA: Diagnosis not present

## 2018-05-13 DIAGNOSIS — F339 Major depressive disorder, recurrent, unspecified: Secondary | ICD-10-CM | POA: Diagnosis not present

## 2018-05-13 DIAGNOSIS — R2689 Other abnormalities of gait and mobility: Secondary | ICD-10-CM | POA: Diagnosis not present

## 2018-05-13 DIAGNOSIS — F419 Anxiety disorder, unspecified: Secondary | ICD-10-CM | POA: Diagnosis not present

## 2018-05-13 DIAGNOSIS — G609 Hereditary and idiopathic neuropathy, unspecified: Secondary | ICD-10-CM | POA: Diagnosis not present

## 2018-05-13 DIAGNOSIS — I739 Peripheral vascular disease, unspecified: Secondary | ICD-10-CM | POA: Diagnosis not present

## 2018-05-13 DIAGNOSIS — R278 Other lack of coordination: Secondary | ICD-10-CM | POA: Diagnosis not present

## 2018-05-14 DIAGNOSIS — Z86718 Personal history of other venous thrombosis and embolism: Secondary | ICD-10-CM | POA: Diagnosis not present

## 2018-05-14 DIAGNOSIS — L989 Disorder of the skin and subcutaneous tissue, unspecified: Secondary | ICD-10-CM | POA: Diagnosis not present

## 2018-05-14 DIAGNOSIS — I10 Essential (primary) hypertension: Secondary | ICD-10-CM | POA: Diagnosis not present

## 2018-05-14 DIAGNOSIS — I480 Paroxysmal atrial fibrillation: Secondary | ICD-10-CM | POA: Diagnosis not present

## 2018-05-28 DIAGNOSIS — M25612 Stiffness of left shoulder, not elsewhere classified: Secondary | ICD-10-CM | POA: Diagnosis not present

## 2018-05-28 DIAGNOSIS — I10 Essential (primary) hypertension: Secondary | ICD-10-CM | POA: Diagnosis not present

## 2018-05-28 DIAGNOSIS — G63 Polyneuropathy in diseases classified elsewhere: Secondary | ICD-10-CM | POA: Diagnosis not present

## 2018-05-28 DIAGNOSIS — M25512 Pain in left shoulder: Secondary | ICD-10-CM | POA: Diagnosis not present

## 2018-05-28 DIAGNOSIS — I480 Paroxysmal atrial fibrillation: Secondary | ICD-10-CM | POA: Diagnosis not present

## 2018-05-30 ENCOUNTER — Other Ambulatory Visit: Payer: Self-pay | Admitting: Orthopaedic Surgery

## 2018-05-30 DIAGNOSIS — M542 Cervicalgia: Secondary | ICD-10-CM | POA: Diagnosis not present

## 2018-05-30 DIAGNOSIS — M25512 Pain in left shoulder: Secondary | ICD-10-CM | POA: Diagnosis not present

## 2018-05-30 DIAGNOSIS — T148XXA Other injury of unspecified body region, initial encounter: Secondary | ICD-10-CM

## 2018-05-31 ENCOUNTER — Ambulatory Visit
Admission: RE | Admit: 2018-05-31 | Discharge: 2018-05-31 | Disposition: A | Discharge status: Home / Self Care | Payer: PPO | Source: Ambulatory Visit | Attending: Orthopaedic Surgery | Admitting: Orthopaedic Surgery

## 2018-05-31 DIAGNOSIS — T148XXA Other injury of unspecified body region, initial encounter: Secondary | ICD-10-CM

## 2018-05-31 DIAGNOSIS — E039 Hypothyroidism, unspecified: Secondary | ICD-10-CM | POA: Diagnosis not present

## 2018-05-31 DIAGNOSIS — S46001A Unspecified injury of muscle(s) and tendon(s) of the rotator cuff of right shoulder, initial encounter: Secondary | ICD-10-CM | POA: Diagnosis not present

## 2018-05-31 DIAGNOSIS — F0151 Vascular dementia with behavioral disturbance: Secondary | ICD-10-CM | POA: Diagnosis not present

## 2018-05-31 DIAGNOSIS — I4891 Unspecified atrial fibrillation: Secondary | ICD-10-CM | POA: Diagnosis not present

## 2018-05-31 DIAGNOSIS — M25559 Pain in unspecified hip: Secondary | ICD-10-CM | POA: Diagnosis not present

## 2018-05-31 DIAGNOSIS — I10 Essential (primary) hypertension: Secondary | ICD-10-CM | POA: Diagnosis not present

## 2018-05-31 DIAGNOSIS — F411 Generalized anxiety disorder: Secondary | ICD-10-CM | POA: Diagnosis not present

## 2018-05-31 DIAGNOSIS — M5090 Cervical disc disorder, unspecified, unspecified cervical region: Secondary | ICD-10-CM | POA: Diagnosis not present

## 2018-05-31 DIAGNOSIS — I739 Peripheral vascular disease, unspecified: Secondary | ICD-10-CM | POA: Diagnosis not present

## 2018-05-31 DIAGNOSIS — M25551 Pain in right hip: Secondary | ICD-10-CM | POA: Diagnosis not present

## 2018-05-31 DIAGNOSIS — I82409 Acute embolism and thrombosis of unspecified deep veins of unspecified lower extremity: Secondary | ICD-10-CM | POA: Diagnosis not present

## 2018-06-04 IMAGING — MR MR LUMBAR SPINE W/O CM
4 of 5 series · 25 of 48 positions shown · non-contrast
Comparison: 07/24/2017 lumbar spine radiographs. 06/28/2015 lumbar
spine MRI.

CLINICAL DATA: 89 y/o F; lower back pain radiating down the right
leg. Compression deformity.

EXAM:
MRI LUMBAR SPINE WITHOUT CONTRAST
TECHNIQUE: Multiplanar, multisequence MR imaging of the lumbar spine was
performed. No intravenous contrast was administered.

[Series 3: T2 · sagittal · 4.0mm · 0.55mm/px · 5 of 17 slices shown (1 of 2)]
[im 1/17]
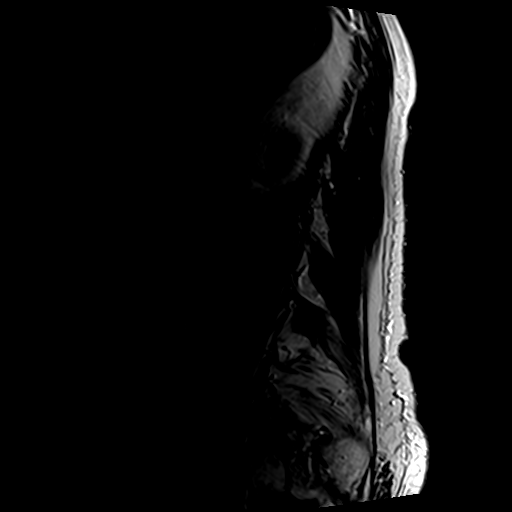
[im 5/17]
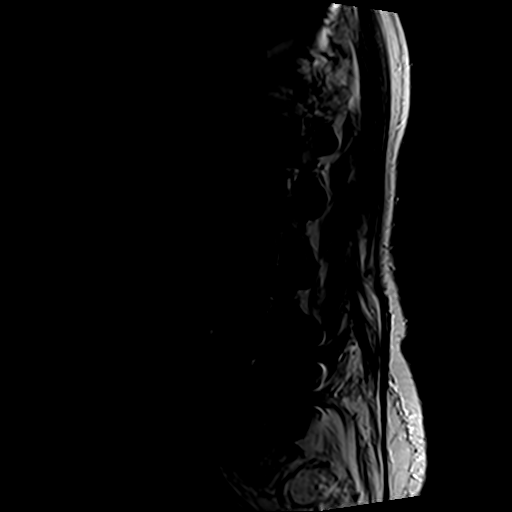
[im 9/17]
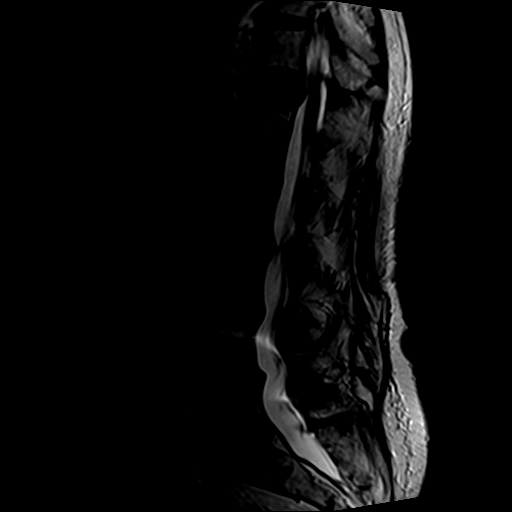
[im 13/17]
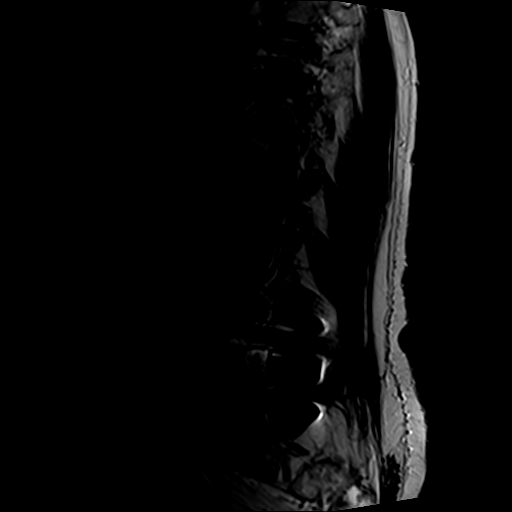
[im 17/17]
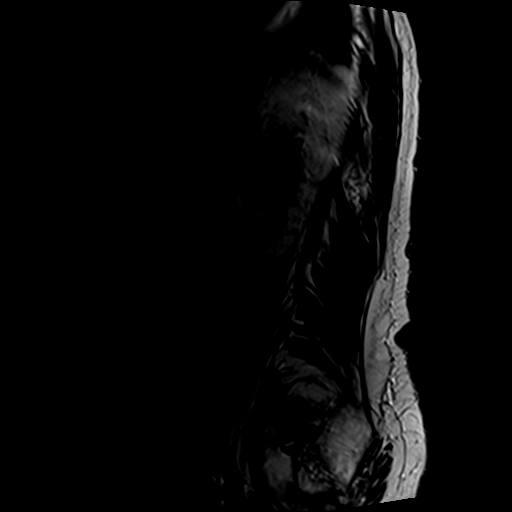

[Series 5: T1 · sagittal · 4.0mm · 0.55mm/px · 6 of 17 slices shown (1 of 2)]
[im 1/17]
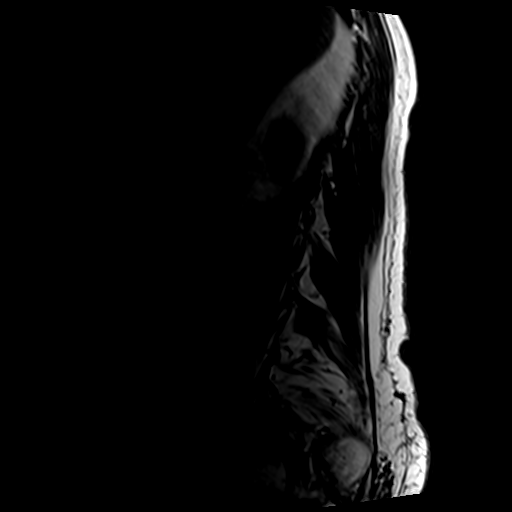
[im 4/17]
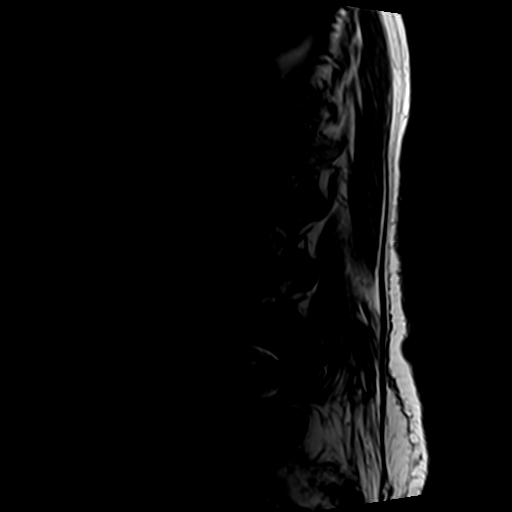
[im 7/17]
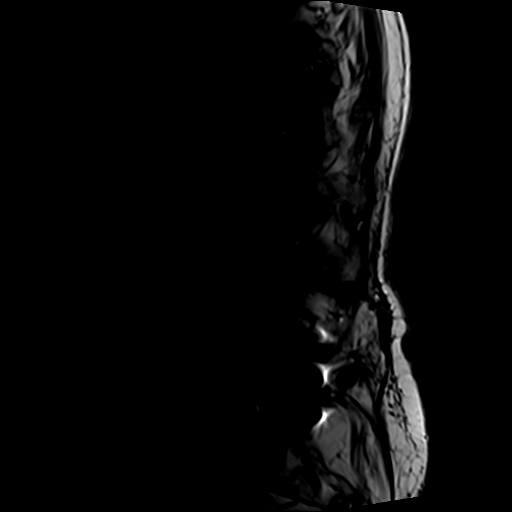
[im 10/17]
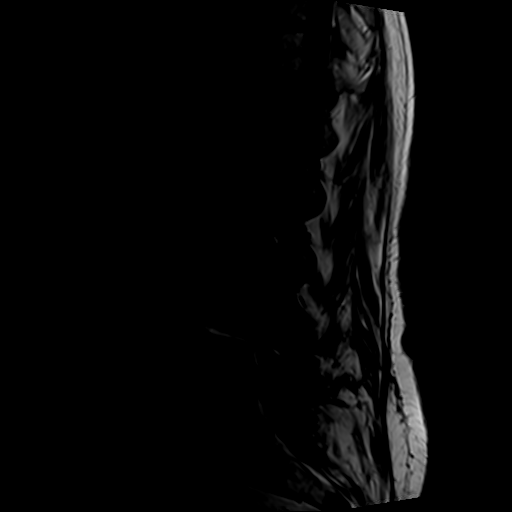
[im 13/17]
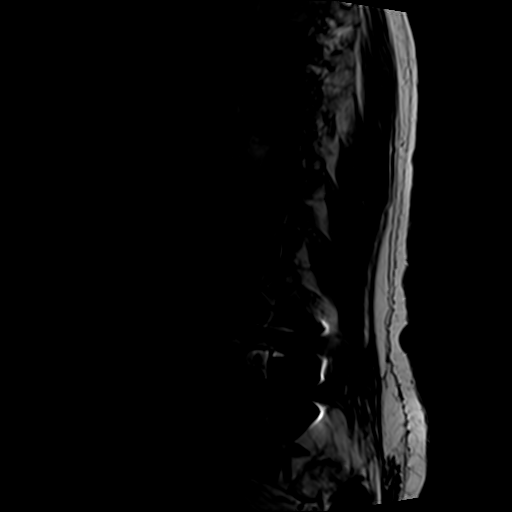
[im 17/17]
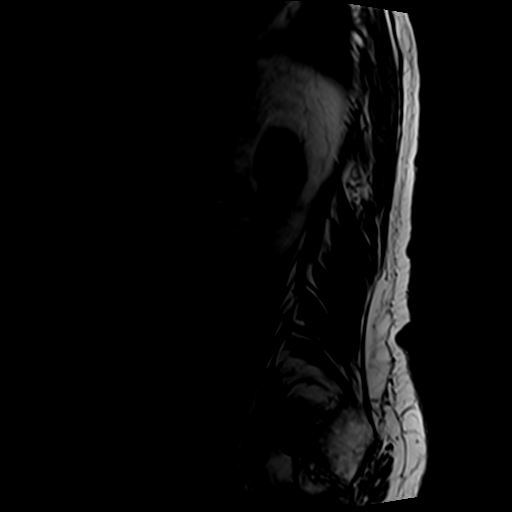

[Series 6: T2 · axial · 4.0mm · 0.70mm/px · z∈[-88,+149]mm · 10 of 47 slices shown (2 of 2)]
[im 4/47]
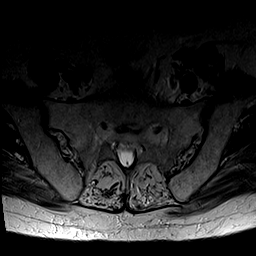
[im 7/47]
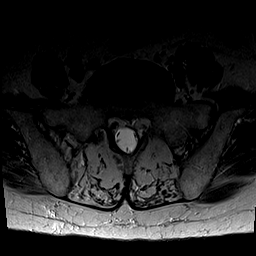
[im 10/47]
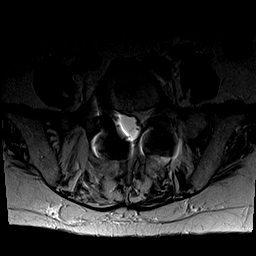
[im 16/47]
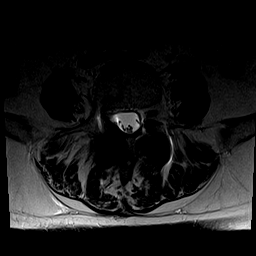
[im 22/47]
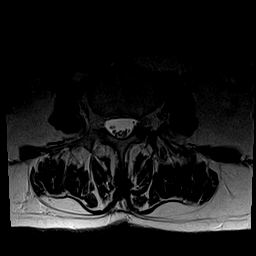
[im 25/47]
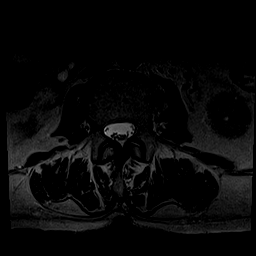
[im 28/47]
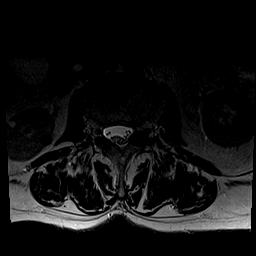
[im 34/47]
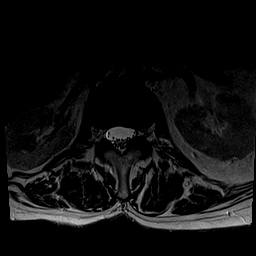
[im 40/47]
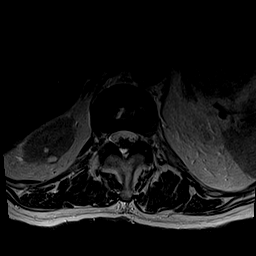
[im 47/47]
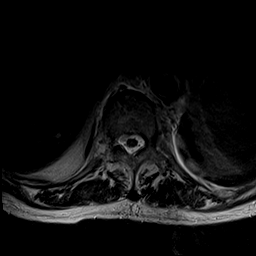

[Series 7: T1 · axial · 4.0mm · 0.35mm/px · z∈[-88,+113]mm · 4 of 47 slices shown (2 of 2)]
[im 4/47]
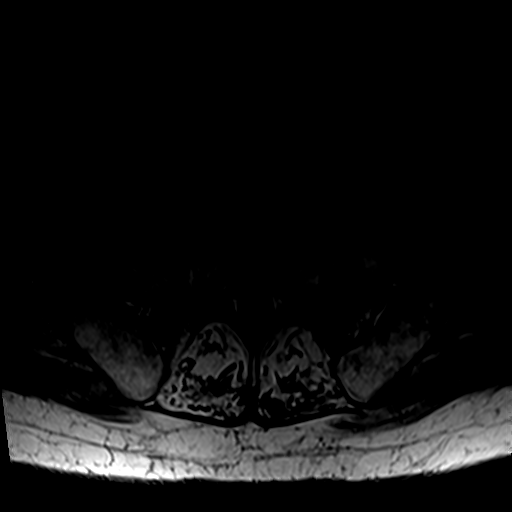
[im 7/47]
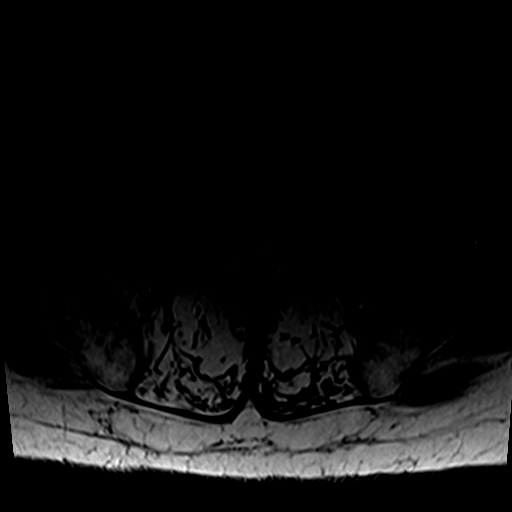
[im 25/47]
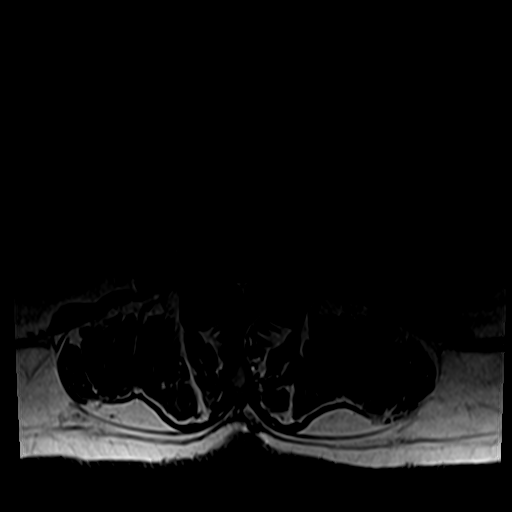
[im 40/47]
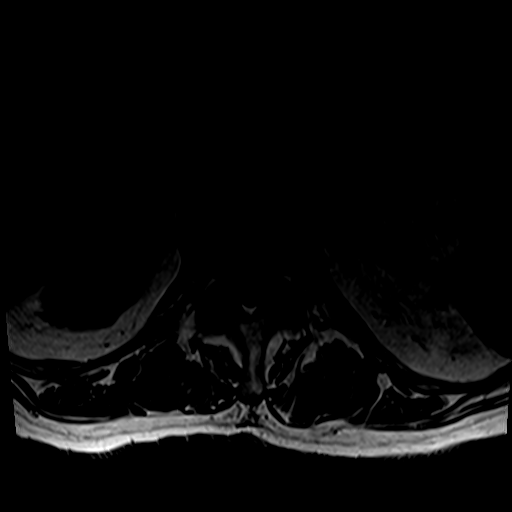

[25 of 48 positions shown; findings below may reference images not displayed]

FINDINGS: Segmentation:  Standard.

Alignment: Mild dextrocurvature with apex at L3. Normal lumbar
lordosis without listhesis.

Vertebrae: Moderate T12 compression deformity with edema indicating
recent injury. Fracture includes the anterior middle columns, burst
fracture). 3 mm retropulsion of superior endplate.

L4-5 posterior instrumented and interbody fusion, susceptibility
artifact from fusion hardware partially obscures the vertebral
bodies at those levels. No additional focus of vertebral body or
intervertebral disc abnormal signal.

Conus medullaris and cauda equina: Conus extends to the L1 level.
Conus and cauda equina appear normal.

Paraspinal and other soft tissues: Left kidney lower pole 14 mm cyst
and multiple subcentimeter cysts bilaterally.

Disc levels:

T11-12: Retropulsion of the T12 superior endplate results in mild
canal stenosis. The retropulsed vertebral body effaces the ventral
thecal sac.

T12-L1: No significant disc displacement, foraminal stenosis, or
canal stenosis.

L1-2: No significant disc displacement, foraminal stenosis, or canal
stenosis.

L2-3: Small disc bulge eccentric to the left with mild bilateral
facet and ligamentum flavum hypertrophy. Mild left foraminal and
lateral recess stenosis. No significant canal stenosis.

L3-4: Mild disc bulge with facet and ligamentum flavum hypertrophy.
Mild bilateral foraminal stenosis. Mild canal stenosis.

L4-5: Interbody fusion with endplate marginal osteophytes and
bilateral facet hypertrophy. Laminectomy. Mild bilateral foraminal
stenosis. No canal stenosis.

L5-S1: Mild disc bulge and facet hypertrophy. No significant
foraminal or canal stenosis.
IMPRESSION: 1. Moderate T12 compression deformity involving anterior and middle
columns, burst fracture. Edema indicates recent injury. 3 mm
retropulsion of superior endplate resulting in mild-to-moderate
canal stenosis and ventral thecal sac effacement. No cord
compression.
2. Stable L5-S1 lumbar spondylosis with multilevel mild foraminal
stenosis and canal stenosis. No high-grade foraminal or canal
stenosis.

By: Ishaan Chakravarty M.D.

## 2018-06-06 DIAGNOSIS — I480 Paroxysmal atrial fibrillation: Secondary | ICD-10-CM | POA: Diagnosis not present

## 2018-06-06 DIAGNOSIS — S7011XA Contusion of right thigh, initial encounter: Secondary | ICD-10-CM | POA: Diagnosis not present

## 2018-06-06 DIAGNOSIS — M79604 Pain in right leg: Secondary | ICD-10-CM | POA: Diagnosis not present

## 2018-06-06 DIAGNOSIS — W19XXXD Unspecified fall, subsequent encounter: Secondary | ICD-10-CM | POA: Diagnosis not present

## 2018-06-07 DIAGNOSIS — S7011XA Contusion of right thigh, initial encounter: Secondary | ICD-10-CM | POA: Diagnosis not present

## 2018-06-07 DIAGNOSIS — M79604 Pain in right leg: Secondary | ICD-10-CM | POA: Diagnosis not present

## 2018-06-07 DIAGNOSIS — M25551 Pain in right hip: Secondary | ICD-10-CM | POA: Diagnosis not present

## 2018-06-07 DIAGNOSIS — W19XXXD Unspecified fall, subsequent encounter: Secondary | ICD-10-CM | POA: Diagnosis not present

## 2018-06-07 DIAGNOSIS — F0390 Unspecified dementia without behavioral disturbance: Secondary | ICD-10-CM | POA: Diagnosis not present

## 2018-06-11 DIAGNOSIS — D519 Vitamin B12 deficiency anemia, unspecified: Secondary | ICD-10-CM | POA: Diagnosis not present

## 2018-06-11 DIAGNOSIS — D649 Anemia, unspecified: Secondary | ICD-10-CM | POA: Diagnosis not present

## 2018-06-11 DIAGNOSIS — Z79899 Other long term (current) drug therapy: Secondary | ICD-10-CM | POA: Diagnosis not present

## 2018-06-11 DIAGNOSIS — I10 Essential (primary) hypertension: Secondary | ICD-10-CM | POA: Diagnosis not present

## 2018-06-11 DIAGNOSIS — E789 Disorder of lipoprotein metabolism, unspecified: Secondary | ICD-10-CM | POA: Diagnosis not present

## 2018-06-11 DIAGNOSIS — E785 Hyperlipidemia, unspecified: Secondary | ICD-10-CM | POA: Diagnosis not present

## 2018-06-12 DIAGNOSIS — M6281 Muscle weakness (generalized): Secondary | ICD-10-CM | POA: Diagnosis not present

## 2018-06-12 DIAGNOSIS — G609 Hereditary and idiopathic neuropathy, unspecified: Secondary | ICD-10-CM | POA: Diagnosis not present

## 2018-06-12 DIAGNOSIS — F419 Anxiety disorder, unspecified: Secondary | ICD-10-CM | POA: Diagnosis not present

## 2018-06-12 DIAGNOSIS — I4891 Unspecified atrial fibrillation: Secondary | ICD-10-CM | POA: Diagnosis not present

## 2018-06-12 DIAGNOSIS — E039 Hypothyroidism, unspecified: Secondary | ICD-10-CM | POA: Diagnosis not present

## 2018-06-12 DIAGNOSIS — I739 Peripheral vascular disease, unspecified: Secondary | ICD-10-CM | POA: Diagnosis not present

## 2018-06-12 DIAGNOSIS — I158 Other secondary hypertension: Secondary | ICD-10-CM | POA: Diagnosis not present

## 2018-06-12 DIAGNOSIS — R2689 Other abnormalities of gait and mobility: Secondary | ICD-10-CM | POA: Diagnosis not present

## 2018-06-12 DIAGNOSIS — R278 Other lack of coordination: Secondary | ICD-10-CM | POA: Diagnosis not present

## 2018-06-12 DIAGNOSIS — F0390 Unspecified dementia without behavioral disturbance: Secondary | ICD-10-CM | POA: Diagnosis not present

## 2018-06-12 DIAGNOSIS — R1312 Dysphagia, oropharyngeal phase: Secondary | ICD-10-CM | POA: Diagnosis not present

## 2018-06-12 DIAGNOSIS — F339 Major depressive disorder, recurrent, unspecified: Secondary | ICD-10-CM | POA: Diagnosis not present

## 2018-06-28 DIAGNOSIS — M5090 Cervical disc disorder, unspecified, unspecified cervical region: Secondary | ICD-10-CM | POA: Diagnosis not present

## 2018-06-28 DIAGNOSIS — F411 Generalized anxiety disorder: Secondary | ICD-10-CM | POA: Diagnosis not present

## 2018-06-28 DIAGNOSIS — I4891 Unspecified atrial fibrillation: Secondary | ICD-10-CM | POA: Diagnosis not present

## 2018-06-28 DIAGNOSIS — E039 Hypothyroidism, unspecified: Secondary | ICD-10-CM | POA: Diagnosis not present

## 2018-06-28 DIAGNOSIS — I10 Essential (primary) hypertension: Secondary | ICD-10-CM | POA: Diagnosis not present

## 2018-06-28 DIAGNOSIS — M25559 Pain in unspecified hip: Secondary | ICD-10-CM | POA: Diagnosis not present

## 2018-06-28 DIAGNOSIS — I82409 Acute embolism and thrombosis of unspecified deep veins of unspecified lower extremity: Secondary | ICD-10-CM | POA: Diagnosis not present

## 2018-06-28 DIAGNOSIS — F0151 Vascular dementia with behavioral disturbance: Secondary | ICD-10-CM | POA: Diagnosis not present

## 2018-06-28 DIAGNOSIS — I739 Peripheral vascular disease, unspecified: Secondary | ICD-10-CM | POA: Diagnosis not present

## 2018-07-07 ENCOUNTER — Encounter: Payer: Self-pay | Admitting: Nurse Practitioner

## 2018-08-02 DIAGNOSIS — M5090 Cervical disc disorder, unspecified, unspecified cervical region: Secondary | ICD-10-CM | POA: Diagnosis not present

## 2018-08-02 DIAGNOSIS — E039 Hypothyroidism, unspecified: Secondary | ICD-10-CM | POA: Diagnosis not present

## 2018-08-02 DIAGNOSIS — I10 Essential (primary) hypertension: Secondary | ICD-10-CM | POA: Diagnosis not present

## 2018-08-02 DIAGNOSIS — F411 Generalized anxiety disorder: Secondary | ICD-10-CM | POA: Diagnosis not present

## 2018-08-02 DIAGNOSIS — I82409 Acute embolism and thrombosis of unspecified deep veins of unspecified lower extremity: Secondary | ICD-10-CM | POA: Diagnosis not present

## 2018-08-02 DIAGNOSIS — F0151 Vascular dementia with behavioral disturbance: Secondary | ICD-10-CM | POA: Diagnosis not present

## 2018-08-02 DIAGNOSIS — I4891 Unspecified atrial fibrillation: Secondary | ICD-10-CM | POA: Diagnosis not present

## 2018-08-02 DIAGNOSIS — M25559 Pain in unspecified hip: Secondary | ICD-10-CM | POA: Diagnosis not present

## 2018-08-02 DIAGNOSIS — I739 Peripheral vascular disease, unspecified: Secondary | ICD-10-CM | POA: Diagnosis not present

## 2018-08-30 DIAGNOSIS — F0151 Vascular dementia with behavioral disturbance: Secondary | ICD-10-CM | POA: Diagnosis not present

## 2018-08-30 DIAGNOSIS — F411 Generalized anxiety disorder: Secondary | ICD-10-CM | POA: Diagnosis not present

## 2018-08-30 DIAGNOSIS — I10 Essential (primary) hypertension: Secondary | ICD-10-CM | POA: Diagnosis not present

## 2018-08-30 DIAGNOSIS — M5090 Cervical disc disorder, unspecified, unspecified cervical region: Secondary | ICD-10-CM | POA: Diagnosis not present

## 2018-08-30 DIAGNOSIS — I4891 Unspecified atrial fibrillation: Secondary | ICD-10-CM | POA: Diagnosis not present

## 2018-08-30 DIAGNOSIS — I739 Peripheral vascular disease, unspecified: Secondary | ICD-10-CM | POA: Diagnosis not present

## 2018-08-30 DIAGNOSIS — I82409 Acute embolism and thrombosis of unspecified deep veins of unspecified lower extremity: Secondary | ICD-10-CM | POA: Diagnosis not present

## 2018-08-30 DIAGNOSIS — E039 Hypothyroidism, unspecified: Secondary | ICD-10-CM | POA: Diagnosis not present

## 2018-08-30 DIAGNOSIS — M25559 Pain in unspecified hip: Secondary | ICD-10-CM | POA: Diagnosis not present

## 2018-09-11 DIAGNOSIS — I739 Peripheral vascular disease, unspecified: Secondary | ICD-10-CM | POA: Diagnosis not present

## 2018-09-11 DIAGNOSIS — M6281 Muscle weakness (generalized): Secondary | ICD-10-CM | POA: Diagnosis not present

## 2018-09-11 DIAGNOSIS — Z20828 Contact with and (suspected) exposure to other viral communicable diseases: Secondary | ICD-10-CM | POA: Diagnosis not present

## 2018-09-11 DIAGNOSIS — F0390 Unspecified dementia without behavioral disturbance: Secondary | ICD-10-CM | POA: Diagnosis not present

## 2018-09-11 DIAGNOSIS — R1312 Dysphagia, oropharyngeal phase: Secondary | ICD-10-CM | POA: Diagnosis not present

## 2018-09-11 DIAGNOSIS — R2689 Other abnormalities of gait and mobility: Secondary | ICD-10-CM | POA: Diagnosis not present

## 2018-09-11 DIAGNOSIS — F339 Major depressive disorder, recurrent, unspecified: Secondary | ICD-10-CM | POA: Diagnosis not present

## 2018-09-11 DIAGNOSIS — E039 Hypothyroidism, unspecified: Secondary | ICD-10-CM | POA: Diagnosis not present

## 2018-09-11 DIAGNOSIS — G609 Hereditary and idiopathic neuropathy, unspecified: Secondary | ICD-10-CM | POA: Diagnosis not present

## 2018-09-11 DIAGNOSIS — F419 Anxiety disorder, unspecified: Secondary | ICD-10-CM | POA: Diagnosis not present

## 2018-09-11 DIAGNOSIS — R278 Other lack of coordination: Secondary | ICD-10-CM | POA: Diagnosis not present

## 2018-09-11 DIAGNOSIS — I4891 Unspecified atrial fibrillation: Secondary | ICD-10-CM | POA: Diagnosis not present

## 2018-09-11 DIAGNOSIS — I158 Other secondary hypertension: Secondary | ICD-10-CM | POA: Diagnosis not present

## 2018-09-18 DIAGNOSIS — Z634 Disappearance and death of family member: Secondary | ICD-10-CM | POA: Diagnosis not present

## 2018-09-18 DIAGNOSIS — F329 Major depressive disorder, single episode, unspecified: Secondary | ICD-10-CM | POA: Diagnosis not present

## 2018-09-18 DIAGNOSIS — Z20828 Contact with and (suspected) exposure to other viral communicable diseases: Secondary | ICD-10-CM | POA: Diagnosis not present

## 2018-09-18 DIAGNOSIS — F0390 Unspecified dementia without behavioral disturbance: Secondary | ICD-10-CM | POA: Diagnosis not present

## 2018-09-18 DIAGNOSIS — F419 Anxiety disorder, unspecified: Secondary | ICD-10-CM | POA: Diagnosis not present

## 2018-09-23 ENCOUNTER — Encounter: Payer: Self-pay | Admitting: Family

## 2018-09-23 ENCOUNTER — Ambulatory Visit: Payer: Self-pay

## 2018-09-24 ENCOUNTER — Encounter: Payer: PPO | Admitting: Family

## 2018-09-25 DIAGNOSIS — Z20828 Contact with and (suspected) exposure to other viral communicable diseases: Secondary | ICD-10-CM | POA: Diagnosis not present

## 2018-09-27 DIAGNOSIS — I10 Essential (primary) hypertension: Secondary | ICD-10-CM | POA: Diagnosis not present

## 2018-09-27 DIAGNOSIS — E039 Hypothyroidism, unspecified: Secondary | ICD-10-CM | POA: Diagnosis not present

## 2018-09-27 DIAGNOSIS — I82409 Acute embolism and thrombosis of unspecified deep veins of unspecified lower extremity: Secondary | ICD-10-CM | POA: Diagnosis not present

## 2018-09-27 DIAGNOSIS — I739 Peripheral vascular disease, unspecified: Secondary | ICD-10-CM | POA: Diagnosis not present

## 2018-09-27 DIAGNOSIS — M5090 Cervical disc disorder, unspecified, unspecified cervical region: Secondary | ICD-10-CM | POA: Diagnosis not present

## 2018-09-27 DIAGNOSIS — I4891 Unspecified atrial fibrillation: Secondary | ICD-10-CM | POA: Diagnosis not present

## 2018-09-27 DIAGNOSIS — F0151 Vascular dementia with behavioral disturbance: Secondary | ICD-10-CM | POA: Diagnosis not present

## 2018-09-27 DIAGNOSIS — F411 Generalized anxiety disorder: Secondary | ICD-10-CM | POA: Diagnosis not present

## 2018-10-02 DIAGNOSIS — Z20828 Contact with and (suspected) exposure to other viral communicable diseases: Secondary | ICD-10-CM | POA: Diagnosis not present

## 2018-10-08 DIAGNOSIS — I480 Paroxysmal atrial fibrillation: Secondary | ICD-10-CM | POA: Diagnosis not present

## 2018-10-08 DIAGNOSIS — G4709 Other insomnia: Secondary | ICD-10-CM | POA: Diagnosis not present

## 2018-10-08 DIAGNOSIS — I10 Essential (primary) hypertension: Secondary | ICD-10-CM | POA: Diagnosis not present

## 2018-10-08 DIAGNOSIS — F329 Major depressive disorder, single episode, unspecified: Secondary | ICD-10-CM | POA: Diagnosis not present

## 2018-10-25 DIAGNOSIS — F419 Anxiety disorder, unspecified: Secondary | ICD-10-CM | POA: Diagnosis not present

## 2018-10-25 DIAGNOSIS — F329 Major depressive disorder, single episode, unspecified: Secondary | ICD-10-CM | POA: Diagnosis not present

## 2018-10-25 DIAGNOSIS — I480 Paroxysmal atrial fibrillation: Secondary | ICD-10-CM | POA: Diagnosis not present

## 2018-10-25 DIAGNOSIS — I10 Essential (primary) hypertension: Secondary | ICD-10-CM | POA: Diagnosis not present

## 2018-10-29 DIAGNOSIS — F064 Anxiety disorder due to known physiological condition: Secondary | ICD-10-CM | POA: Diagnosis not present

## 2018-10-29 DIAGNOSIS — F4321 Adjustment disorder with depressed mood: Secondary | ICD-10-CM | POA: Diagnosis not present

## 2018-10-29 DIAGNOSIS — F0391 Unspecified dementia with behavioral disturbance: Secondary | ICD-10-CM | POA: Diagnosis not present

## 2018-11-01 DIAGNOSIS — M5090 Cervical disc disorder, unspecified, unspecified cervical region: Secondary | ICD-10-CM | POA: Diagnosis not present

## 2018-11-01 DIAGNOSIS — I4891 Unspecified atrial fibrillation: Secondary | ICD-10-CM | POA: Diagnosis not present

## 2018-11-01 DIAGNOSIS — I82409 Acute embolism and thrombosis of unspecified deep veins of unspecified lower extremity: Secondary | ICD-10-CM | POA: Diagnosis not present

## 2018-11-01 DIAGNOSIS — E039 Hypothyroidism, unspecified: Secondary | ICD-10-CM | POA: Diagnosis not present

## 2018-11-01 DIAGNOSIS — F411 Generalized anxiety disorder: Secondary | ICD-10-CM | POA: Diagnosis not present

## 2018-11-01 DIAGNOSIS — I10 Essential (primary) hypertension: Secondary | ICD-10-CM | POA: Diagnosis not present

## 2018-11-01 DIAGNOSIS — M25559 Pain in unspecified hip: Secondary | ICD-10-CM | POA: Diagnosis not present

## 2018-11-01 DIAGNOSIS — F0151 Vascular dementia with behavioral disturbance: Secondary | ICD-10-CM | POA: Diagnosis not present

## 2018-11-01 DIAGNOSIS — I739 Peripheral vascular disease, unspecified: Secondary | ICD-10-CM | POA: Diagnosis not present

## 2018-11-20 DIAGNOSIS — D519 Vitamin B12 deficiency anemia, unspecified: Secondary | ICD-10-CM | POA: Diagnosis not present

## 2018-11-20 DIAGNOSIS — E569 Vitamin deficiency, unspecified: Secondary | ICD-10-CM | POA: Diagnosis not present

## 2018-11-20 DIAGNOSIS — E559 Vitamin D deficiency, unspecified: Secondary | ICD-10-CM | POA: Diagnosis not present

## 2018-11-20 DIAGNOSIS — D649 Anemia, unspecified: Secondary | ICD-10-CM | POA: Diagnosis not present

## 2018-11-20 DIAGNOSIS — I10 Essential (primary) hypertension: Secondary | ICD-10-CM | POA: Diagnosis not present

## 2018-11-20 DIAGNOSIS — E785 Hyperlipidemia, unspecified: Secondary | ICD-10-CM | POA: Diagnosis not present

## 2018-11-20 DIAGNOSIS — E039 Hypothyroidism, unspecified: Secondary | ICD-10-CM | POA: Diagnosis not present

## 2018-11-20 DIAGNOSIS — Z79899 Other long term (current) drug therapy: Secondary | ICD-10-CM | POA: Diagnosis not present

## 2018-11-26 DIAGNOSIS — F0391 Unspecified dementia with behavioral disturbance: Secondary | ICD-10-CM | POA: Diagnosis not present

## 2018-11-26 DIAGNOSIS — F064 Anxiety disorder due to known physiological condition: Secondary | ICD-10-CM | POA: Diagnosis not present

## 2018-11-26 DIAGNOSIS — F4321 Adjustment disorder with depressed mood: Secondary | ICD-10-CM | POA: Diagnosis not present

## 2018-11-29 DIAGNOSIS — E039 Hypothyroidism, unspecified: Secondary | ICD-10-CM | POA: Diagnosis not present

## 2018-11-29 DIAGNOSIS — F411 Generalized anxiety disorder: Secondary | ICD-10-CM | POA: Diagnosis not present

## 2018-11-29 DIAGNOSIS — I4891 Unspecified atrial fibrillation: Secondary | ICD-10-CM | POA: Diagnosis not present

## 2018-11-29 DIAGNOSIS — M25559 Pain in unspecified hip: Secondary | ICD-10-CM | POA: Diagnosis not present

## 2018-11-29 DIAGNOSIS — I10 Essential (primary) hypertension: Secondary | ICD-10-CM | POA: Diagnosis not present

## 2018-11-29 DIAGNOSIS — I82409 Acute embolism and thrombosis of unspecified deep veins of unspecified lower extremity: Secondary | ICD-10-CM | POA: Diagnosis not present

## 2018-11-29 DIAGNOSIS — F0151 Vascular dementia with behavioral disturbance: Secondary | ICD-10-CM | POA: Diagnosis not present

## 2018-11-29 DIAGNOSIS — M5090 Cervical disc disorder, unspecified, unspecified cervical region: Secondary | ICD-10-CM | POA: Diagnosis not present

## 2018-11-29 DIAGNOSIS — I739 Peripheral vascular disease, unspecified: Secondary | ICD-10-CM | POA: Diagnosis not present

## 2018-12-12 DIAGNOSIS — Z20828 Contact with and (suspected) exposure to other viral communicable diseases: Secondary | ICD-10-CM | POA: Diagnosis not present

## 2018-12-18 DIAGNOSIS — E0590 Thyrotoxicosis, unspecified without thyrotoxic crisis or storm: Secondary | ICD-10-CM | POA: Diagnosis not present

## 2018-12-18 DIAGNOSIS — E039 Hypothyroidism, unspecified: Secondary | ICD-10-CM | POA: Diagnosis not present

## 2018-12-18 DIAGNOSIS — Z20828 Contact with and (suspected) exposure to other viral communicable diseases: Secondary | ICD-10-CM | POA: Diagnosis not present

## 2018-12-20 DIAGNOSIS — R278 Other lack of coordination: Secondary | ICD-10-CM | POA: Diagnosis not present

## 2018-12-20 DIAGNOSIS — F0390 Unspecified dementia without behavioral disturbance: Secondary | ICD-10-CM | POA: Diagnosis not present

## 2018-12-20 DIAGNOSIS — E039 Hypothyroidism, unspecified: Secondary | ICD-10-CM | POA: Diagnosis not present

## 2018-12-20 DIAGNOSIS — F419 Anxiety disorder, unspecified: Secondary | ICD-10-CM | POA: Diagnosis not present

## 2018-12-20 DIAGNOSIS — R1312 Dysphagia, oropharyngeal phase: Secondary | ICD-10-CM | POA: Diagnosis not present

## 2018-12-20 DIAGNOSIS — I158 Other secondary hypertension: Secondary | ICD-10-CM | POA: Diagnosis not present

## 2018-12-20 DIAGNOSIS — G609 Hereditary and idiopathic neuropathy, unspecified: Secondary | ICD-10-CM | POA: Diagnosis not present

## 2018-12-20 DIAGNOSIS — M6281 Muscle weakness (generalized): Secondary | ICD-10-CM | POA: Diagnosis not present

## 2018-12-20 DIAGNOSIS — F339 Major depressive disorder, recurrent, unspecified: Secondary | ICD-10-CM | POA: Diagnosis not present

## 2018-12-20 DIAGNOSIS — I4891 Unspecified atrial fibrillation: Secondary | ICD-10-CM | POA: Diagnosis not present

## 2018-12-20 DIAGNOSIS — R2689 Other abnormalities of gait and mobility: Secondary | ICD-10-CM | POA: Diagnosis not present

## 2018-12-20 DIAGNOSIS — I739 Peripheral vascular disease, unspecified: Secondary | ICD-10-CM | POA: Diagnosis not present

## 2018-12-24 DIAGNOSIS — F0150 Vascular dementia without behavioral disturbance: Secondary | ICD-10-CM | POA: Diagnosis not present

## 2018-12-24 DIAGNOSIS — F0280 Dementia in other diseases classified elsewhere without behavioral disturbance: Secondary | ICD-10-CM | POA: Diagnosis not present

## 2018-12-24 DIAGNOSIS — G301 Alzheimer's disease with late onset: Secondary | ICD-10-CM | POA: Diagnosis not present

## 2018-12-31 DIAGNOSIS — Z20828 Contact with and (suspected) exposure to other viral communicable diseases: Secondary | ICD-10-CM | POA: Diagnosis not present

## 2019-01-03 DIAGNOSIS — I480 Paroxysmal atrial fibrillation: Secondary | ICD-10-CM | POA: Diagnosis not present

## 2019-01-03 DIAGNOSIS — I739 Peripheral vascular disease, unspecified: Secondary | ICD-10-CM | POA: Diagnosis not present

## 2019-01-03 DIAGNOSIS — I82409 Acute embolism and thrombosis of unspecified deep veins of unspecified lower extremity: Secondary | ICD-10-CM | POA: Diagnosis not present

## 2019-01-03 DIAGNOSIS — I10 Essential (primary) hypertension: Secondary | ICD-10-CM | POA: Diagnosis not present

## 2019-01-03 DIAGNOSIS — M5090 Cervical disc disorder, unspecified, unspecified cervical region: Secondary | ICD-10-CM | POA: Diagnosis not present

## 2019-01-03 DIAGNOSIS — J3089 Other allergic rhinitis: Secondary | ICD-10-CM | POA: Diagnosis not present

## 2019-01-03 DIAGNOSIS — F0390 Unspecified dementia without behavioral disturbance: Secondary | ICD-10-CM | POA: Diagnosis not present

## 2019-01-03 DIAGNOSIS — E039 Hypothyroidism, unspecified: Secondary | ICD-10-CM | POA: Diagnosis not present

## 2019-01-03 DIAGNOSIS — F0151 Vascular dementia with behavioral disturbance: Secondary | ICD-10-CM | POA: Diagnosis not present

## 2019-01-03 DIAGNOSIS — M25559 Pain in unspecified hip: Secondary | ICD-10-CM | POA: Diagnosis not present

## 2019-01-03 DIAGNOSIS — F411 Generalized anxiety disorder: Secondary | ICD-10-CM | POA: Diagnosis not present

## 2019-01-03 DIAGNOSIS — I4891 Unspecified atrial fibrillation: Secondary | ICD-10-CM | POA: Diagnosis not present

## 2019-01-13 DIAGNOSIS — E039 Hypothyroidism, unspecified: Secondary | ICD-10-CM | POA: Diagnosis not present

## 2019-01-13 DIAGNOSIS — F0390 Unspecified dementia without behavioral disturbance: Secondary | ICD-10-CM | POA: Diagnosis not present

## 2019-01-13 DIAGNOSIS — R278 Other lack of coordination: Secondary | ICD-10-CM | POA: Diagnosis not present

## 2019-01-13 DIAGNOSIS — R1312 Dysphagia, oropharyngeal phase: Secondary | ICD-10-CM | POA: Diagnosis not present

## 2019-01-13 DIAGNOSIS — M6281 Muscle weakness (generalized): Secondary | ICD-10-CM | POA: Diagnosis not present

## 2019-01-13 DIAGNOSIS — I739 Peripheral vascular disease, unspecified: Secondary | ICD-10-CM | POA: Diagnosis not present

## 2019-01-13 DIAGNOSIS — R2689 Other abnormalities of gait and mobility: Secondary | ICD-10-CM | POA: Diagnosis not present

## 2019-01-13 DIAGNOSIS — F419 Anxiety disorder, unspecified: Secondary | ICD-10-CM | POA: Diagnosis not present

## 2019-01-13 DIAGNOSIS — I158 Other secondary hypertension: Secondary | ICD-10-CM | POA: Diagnosis not present

## 2019-01-13 DIAGNOSIS — I4891 Unspecified atrial fibrillation: Secondary | ICD-10-CM | POA: Diagnosis not present

## 2019-01-13 DIAGNOSIS — F339 Major depressive disorder, recurrent, unspecified: Secondary | ICD-10-CM | POA: Diagnosis not present

## 2019-01-13 DIAGNOSIS — G609 Hereditary and idiopathic neuropathy, unspecified: Secondary | ICD-10-CM | POA: Diagnosis not present

## 2019-01-16 DIAGNOSIS — B029 Zoster without complications: Secondary | ICD-10-CM | POA: Diagnosis not present

## 2019-01-16 DIAGNOSIS — I739 Peripheral vascular disease, unspecified: Secondary | ICD-10-CM | POA: Diagnosis not present

## 2019-01-16 DIAGNOSIS — I10 Essential (primary) hypertension: Secondary | ICD-10-CM | POA: Diagnosis not present

## 2019-01-16 DIAGNOSIS — I480 Paroxysmal atrial fibrillation: Secondary | ICD-10-CM | POA: Diagnosis not present

## 2019-01-21 DIAGNOSIS — Z20828 Contact with and (suspected) exposure to other viral communicable diseases: Secondary | ICD-10-CM | POA: Diagnosis not present

## 2019-01-23 DIAGNOSIS — B029 Zoster without complications: Secondary | ICD-10-CM | POA: Diagnosis not present

## 2019-01-23 DIAGNOSIS — I480 Paroxysmal atrial fibrillation: Secondary | ICD-10-CM | POA: Diagnosis not present

## 2019-01-23 DIAGNOSIS — I10 Essential (primary) hypertension: Secondary | ICD-10-CM | POA: Diagnosis not present

## 2019-01-23 DIAGNOSIS — Z86718 Personal history of other venous thrombosis and embolism: Secondary | ICD-10-CM | POA: Diagnosis not present

## 2019-01-28 DIAGNOSIS — F0150 Vascular dementia without behavioral disturbance: Secondary | ICD-10-CM | POA: Diagnosis not present

## 2019-01-28 DIAGNOSIS — F4321 Adjustment disorder with depressed mood: Secondary | ICD-10-CM | POA: Diagnosis not present

## 2019-01-28 DIAGNOSIS — F0280 Dementia in other diseases classified elsewhere without behavioral disturbance: Secondary | ICD-10-CM | POA: Diagnosis not present

## 2019-01-28 DIAGNOSIS — Z20828 Contact with and (suspected) exposure to other viral communicable diseases: Secondary | ICD-10-CM | POA: Diagnosis not present

## 2019-01-28 DIAGNOSIS — G301 Alzheimer's disease with late onset: Secondary | ICD-10-CM | POA: Diagnosis not present

## 2019-02-02 DIAGNOSIS — I82409 Acute embolism and thrombosis of unspecified deep veins of unspecified lower extremity: Secondary | ICD-10-CM | POA: Diagnosis not present

## 2019-02-02 DIAGNOSIS — I739 Peripheral vascular disease, unspecified: Secondary | ICD-10-CM | POA: Diagnosis not present

## 2019-02-02 DIAGNOSIS — I10 Essential (primary) hypertension: Secondary | ICD-10-CM | POA: Diagnosis not present

## 2019-02-02 DIAGNOSIS — F0151 Vascular dementia with behavioral disturbance: Secondary | ICD-10-CM | POA: Diagnosis not present

## 2019-02-10 DIAGNOSIS — I480 Paroxysmal atrial fibrillation: Secondary | ICD-10-CM | POA: Diagnosis not present

## 2019-02-10 DIAGNOSIS — R079 Chest pain, unspecified: Secondary | ICD-10-CM | POA: Diagnosis not present

## 2019-02-10 DIAGNOSIS — R0789 Other chest pain: Secondary | ICD-10-CM | POA: Diagnosis not present

## 2019-02-10 DIAGNOSIS — M25512 Pain in left shoulder: Secondary | ICD-10-CM | POA: Diagnosis not present

## 2019-02-10 DIAGNOSIS — I10 Essential (primary) hypertension: Secondary | ICD-10-CM | POA: Diagnosis not present

## 2019-02-11 DIAGNOSIS — Z20828 Contact with and (suspected) exposure to other viral communicable diseases: Secondary | ICD-10-CM | POA: Diagnosis not present

## 2019-02-18 ENCOUNTER — Other Ambulatory Visit: Payer: Self-pay | Admitting: *Deleted

## 2019-02-18 NOTE — Patient Outreach (Signed)
[facility]) Care Management  02/18/2019  Misty Patterson [birth year] [id number]   Case reviewed, no patient outreach needed,  and case closed per Bary Castilla, Assistant Clinical Director at [place]  request.   Colbert Coyer. Annia Friendly, BSN, [place] Management [facility] Telephonic CM Phone: [phone] Fax: [phone]

## 2019-02-25 DIAGNOSIS — F0150 Vascular dementia without behavioral disturbance: Secondary | ICD-10-CM | POA: Diagnosis not present

## 2019-02-25 DIAGNOSIS — L603 Nail dystrophy: Secondary | ICD-10-CM | POA: Diagnosis not present

## 2019-02-25 DIAGNOSIS — B351 Tinea unguium: Secondary | ICD-10-CM | POA: Diagnosis not present

## 2019-02-25 DIAGNOSIS — F0280 Dementia in other diseases classified elsewhere without behavioral disturbance: Secondary | ICD-10-CM | POA: Diagnosis not present

## 2019-02-25 DIAGNOSIS — I739 Peripheral vascular disease, unspecified: Secondary | ICD-10-CM | POA: Diagnosis not present

## 2019-02-25 DIAGNOSIS — G301 Alzheimer's disease with late onset: Secondary | ICD-10-CM | POA: Diagnosis not present

## 2019-02-25 DIAGNOSIS — F064 Anxiety disorder due to known physiological condition: Secondary | ICD-10-CM | POA: Diagnosis not present

## 2019-02-25 DIAGNOSIS — Q845 Enlarged and hypertrophic nails: Secondary | ICD-10-CM | POA: Diagnosis not present

## 2019-02-25 DIAGNOSIS — F4321 Adjustment disorder with depressed mood: Secondary | ICD-10-CM | POA: Diagnosis not present

## 2019-02-28 DIAGNOSIS — I82409 Acute embolism and thrombosis of unspecified deep veins of unspecified lower extremity: Secondary | ICD-10-CM | POA: Diagnosis not present

## 2019-02-28 DIAGNOSIS — I739 Peripheral vascular disease, unspecified: Secondary | ICD-10-CM | POA: Diagnosis not present

## 2019-02-28 DIAGNOSIS — I10 Essential (primary) hypertension: Secondary | ICD-10-CM | POA: Diagnosis not present

## 2019-02-28 DIAGNOSIS — F0151 Vascular dementia with behavioral disturbance: Secondary | ICD-10-CM | POA: Diagnosis not present

## 2019-03-05 DIAGNOSIS — Z20828 Contact with and (suspected) exposure to other viral communicable diseases: Secondary | ICD-10-CM | POA: Diagnosis not present

## 2019-03-06 DIAGNOSIS — F0632 Mood disorder due to known physiological condition with major depressive-like episode: Secondary | ICD-10-CM | POA: Diagnosis not present

## 2019-03-06 DIAGNOSIS — G301 Alzheimer's disease with late onset: Secondary | ICD-10-CM | POA: Diagnosis not present

## 2019-03-10 DIAGNOSIS — Z20828 Contact with and (suspected) exposure to other viral communicable diseases: Secondary | ICD-10-CM | POA: Diagnosis not present

## 2019-03-17 DIAGNOSIS — Z20828 Contact with and (suspected) exposure to other viral communicable diseases: Secondary | ICD-10-CM | POA: Diagnosis not present

## 2019-03-18 DIAGNOSIS — F329 Major depressive disorder, single episode, unspecified: Secondary | ICD-10-CM | POA: Diagnosis not present

## 2019-03-18 DIAGNOSIS — F0390 Unspecified dementia without behavioral disturbance: Secondary | ICD-10-CM | POA: Diagnosis not present

## 2019-03-18 DIAGNOSIS — I10 Essential (primary) hypertension: Secondary | ICD-10-CM | POA: Diagnosis not present

## 2019-03-18 DIAGNOSIS — R7611 Nonspecific reaction to tuberculin skin test without active tuberculosis: Secondary | ICD-10-CM | POA: Diagnosis not present

## 2019-03-18 DIAGNOSIS — I480 Paroxysmal atrial fibrillation: Secondary | ICD-10-CM | POA: Diagnosis not present

## 2019-03-21 DIAGNOSIS — I82409 Acute embolism and thrombosis of unspecified deep veins of unspecified lower extremity: Secondary | ICD-10-CM | POA: Diagnosis not present

## 2019-03-21 DIAGNOSIS — F0151 Vascular dementia with behavioral disturbance: Secondary | ICD-10-CM | POA: Diagnosis not present

## 2019-03-21 DIAGNOSIS — I10 Essential (primary) hypertension: Secondary | ICD-10-CM | POA: Diagnosis not present

## 2019-03-21 DIAGNOSIS — I739 Peripheral vascular disease, unspecified: Secondary | ICD-10-CM | POA: Diagnosis not present

## 2019-03-23 DIAGNOSIS — R05 Cough: Secondary | ICD-10-CM | POA: Diagnosis not present

## 2019-03-24 DIAGNOSIS — F0390 Unspecified dementia without behavioral disturbance: Secondary | ICD-10-CM | POA: Diagnosis not present

## 2019-03-24 DIAGNOSIS — R2689 Other abnormalities of gait and mobility: Secondary | ICD-10-CM | POA: Diagnosis not present

## 2019-03-24 DIAGNOSIS — F339 Major depressive disorder, recurrent, unspecified: Secondary | ICD-10-CM | POA: Diagnosis not present

## 2019-03-24 DIAGNOSIS — U071 COVID-19: Secondary | ICD-10-CM | POA: Diagnosis not present

## 2019-03-24 DIAGNOSIS — F419 Anxiety disorder, unspecified: Secondary | ICD-10-CM | POA: Diagnosis not present

## 2019-03-24 DIAGNOSIS — M6281 Muscle weakness (generalized): Secondary | ICD-10-CM | POA: Diagnosis not present

## 2019-03-24 DIAGNOSIS — I4891 Unspecified atrial fibrillation: Secondary | ICD-10-CM | POA: Diagnosis not present

## 2019-03-24 DIAGNOSIS — I739 Peripheral vascular disease, unspecified: Secondary | ICD-10-CM | POA: Diagnosis not present

## 2019-03-24 DIAGNOSIS — G609 Hereditary and idiopathic neuropathy, unspecified: Secondary | ICD-10-CM | POA: Diagnosis not present

## 2019-03-24 DIAGNOSIS — R278 Other lack of coordination: Secondary | ICD-10-CM | POA: Diagnosis not present

## 2019-03-25 ENCOUNTER — Inpatient Hospital Stay (HOSPITAL_COMMUNITY)
Admission: EM | Admit: 2019-03-25 | Discharge: 2019-04-14 | DRG: 951 | Disposition: E | Payer: PPO | Attending: Internal Medicine | Admitting: Internal Medicine

## 2019-03-25 ENCOUNTER — Emergency Department (HOSPITAL_COMMUNITY): Payer: PPO

## 2019-03-25 DIAGNOSIS — R0681 Apnea, not elsewhere classified: Secondary | ICD-10-CM | POA: Diagnosis not present

## 2019-03-25 DIAGNOSIS — Z79890 Hormone replacement therapy (postmenopausal): Secondary | ICD-10-CM

## 2019-03-25 DIAGNOSIS — J440 Chronic obstructive pulmonary disease with acute lower respiratory infection: Secondary | ICD-10-CM | POA: Diagnosis not present

## 2019-03-25 DIAGNOSIS — I4891 Unspecified atrial fibrillation: Secondary | ICD-10-CM | POA: Diagnosis present

## 2019-03-25 DIAGNOSIS — K219 Gastro-esophageal reflux disease without esophagitis: Secondary | ICD-10-CM | POA: Diagnosis present

## 2019-03-25 DIAGNOSIS — Z515 Encounter for palliative care: Secondary | ICD-10-CM | POA: Diagnosis not present

## 2019-03-25 DIAGNOSIS — A4189 Other specified sepsis: Secondary | ICD-10-CM | POA: Diagnosis present

## 2019-03-25 DIAGNOSIS — Z86718 Personal history of other venous thrombosis and embolism: Secondary | ICD-10-CM

## 2019-03-25 DIAGNOSIS — Z8543 Personal history of malignant neoplasm of ovary: Secondary | ICD-10-CM | POA: Diagnosis not present

## 2019-03-25 DIAGNOSIS — R0602 Shortness of breath: Secondary | ICD-10-CM

## 2019-03-25 DIAGNOSIS — N183 Chronic kidney disease, stage 3 unspecified: Secondary | ICD-10-CM | POA: Diagnosis present

## 2019-03-25 DIAGNOSIS — Z66 Do not resuscitate: Secondary | ICD-10-CM | POA: Diagnosis present

## 2019-03-25 DIAGNOSIS — M810 Age-related osteoporosis without current pathological fracture: Secondary | ICD-10-CM | POA: Diagnosis not present

## 2019-03-25 DIAGNOSIS — Z79899 Other long term (current) drug therapy: Secondary | ICD-10-CM

## 2019-03-25 DIAGNOSIS — I509 Heart failure, unspecified: Secondary | ICD-10-CM | POA: Diagnosis not present

## 2019-03-25 DIAGNOSIS — I10 Essential (primary) hypertension: Secondary | ICD-10-CM | POA: Diagnosis not present

## 2019-03-25 DIAGNOSIS — F419 Anxiety disorder, unspecified: Secondary | ICD-10-CM | POA: Diagnosis present

## 2019-03-25 DIAGNOSIS — E785 Hyperlipidemia, unspecified: Secondary | ICD-10-CM | POA: Diagnosis present

## 2019-03-25 DIAGNOSIS — J9601 Acute respiratory failure with hypoxia: Secondary | ICD-10-CM | POA: Diagnosis not present

## 2019-03-25 DIAGNOSIS — R918 Other nonspecific abnormal finding of lung field: Secondary | ICD-10-CM | POA: Diagnosis not present

## 2019-03-25 DIAGNOSIS — N179 Acute kidney failure, unspecified: Secondary | ICD-10-CM | POA: Diagnosis not present

## 2019-03-25 DIAGNOSIS — J1282 Pneumonia due to coronavirus disease 2019: Secondary | ICD-10-CM | POA: Diagnosis present

## 2019-03-25 DIAGNOSIS — I480 Paroxysmal atrial fibrillation: Secondary | ICD-10-CM | POA: Diagnosis not present

## 2019-03-25 DIAGNOSIS — F0390 Unspecified dementia without behavioral disturbance: Secondary | ICD-10-CM | POA: Diagnosis present

## 2019-03-25 DIAGNOSIS — I110 Hypertensive heart disease with heart failure: Secondary | ICD-10-CM | POA: Diagnosis not present

## 2019-03-25 DIAGNOSIS — I129 Hypertensive chronic kidney disease with stage 1 through stage 4 chronic kidney disease, or unspecified chronic kidney disease: Secondary | ICD-10-CM | POA: Diagnosis present

## 2019-03-25 DIAGNOSIS — G9349 Other encephalopathy: Secondary | ICD-10-CM | POA: Diagnosis not present

## 2019-03-25 DIAGNOSIS — Z7902 Long term (current) use of antithrombotics/antiplatelets: Secondary | ICD-10-CM

## 2019-03-25 DIAGNOSIS — U071 COVID-19: Secondary | ICD-10-CM | POA: Diagnosis present

## 2019-03-25 DIAGNOSIS — E872 Acidosis: Secondary | ICD-10-CM | POA: Diagnosis not present

## 2019-03-25 DIAGNOSIS — I739 Peripheral vascular disease, unspecified: Secondary | ICD-10-CM | POA: Diagnosis present

## 2019-03-25 DIAGNOSIS — R404 Transient alteration of awareness: Secondary | ICD-10-CM | POA: Diagnosis not present

## 2019-03-25 DIAGNOSIS — J189 Pneumonia, unspecified organism: Secondary | ICD-10-CM

## 2019-03-25 DIAGNOSIS — G2581 Restless legs syndrome: Secondary | ICD-10-CM | POA: Diagnosis not present

## 2019-03-25 DIAGNOSIS — R402431 Glasgow coma scale score 3-8, in the field [EMT or ambulance]: Secondary | ICD-10-CM | POA: Diagnosis not present

## 2019-03-25 DIAGNOSIS — F329 Major depressive disorder, single episode, unspecified: Secondary | ICD-10-CM | POA: Diagnosis present

## 2019-03-25 LAB — COMPREHENSIVE METABOLIC PANEL
ALT: 74 U/L — ABNORMAL HIGH (ref 0–44)
AST: 136 U/L — ABNORMAL HIGH (ref 15–41)
Albumin: 3.2 g/dL — ABNORMAL LOW (ref 3.5–5.0)
Alkaline Phosphatase: 66 U/L (ref 38–126)
Anion gap: 20 — ABNORMAL HIGH (ref 5–15)
BUN: 41 mg/dL — ABNORMAL HIGH (ref 8–23)
CO2: 15 mmol/L — ABNORMAL LOW (ref 22–32)
Calcium: 8.6 mg/dL — ABNORMAL LOW (ref 8.9–10.3)
Chloride: 101 mmol/L (ref 98–111)
Creatinine, Ser: 2.02 mg/dL — ABNORMAL HIGH (ref 0.44–1.00)
GFR calc Af Amer: 25 mL/min — ABNORMAL LOW (ref >60)
GFR calc non Af Amer: 21 mL/min — ABNORMAL LOW (ref >60)
Glucose, Bld: 284 mg/dL — ABNORMAL HIGH (ref 70–99)
Potassium: 4.3 mmol/L (ref 3.5–5.1)
Sodium: 136 mmol/L (ref 135–145)
Total Bilirubin: 0.7 mg/dL (ref 0.3–1.2)
Total Protein: 7.5 g/dL (ref 6.5–8.1)

## 2019-03-25 LAB — PROTIME-INR
INR: 1.2 (ref 0.8–1.2)
Prothrombin Time: 15 seconds (ref 11.4–15.2)

## 2019-03-25 LAB — LACTIC ACID, PLASMA: Lactic Acid, Venous: >11 mmol/L (ref 0.5–1.9)

## 2019-03-25 LAB — CBC WITH DIFFERENTIAL/PLATELET
Abs Immature Granulocytes: 0 10*3/uL (ref 0.00–0.07)
Basophils Absolute: 0 10*3/uL (ref 0.0–0.1)
Basophils Relative: 0 %
Eosinophils Absolute: 0 10*3/uL (ref 0.0–0.5)
Eosinophils Relative: 0 %
HCT: 48.4 % — ABNORMAL HIGH (ref 36.0–46.0)
Hemoglobin: 14.1 g/dL (ref 12.0–15.0)
Lymphocytes Relative: 11 %
Lymphs Abs: 1.1 10*3/uL (ref 0.7–4.0)
MCH: 27 pg (ref 26.0–34.0)
MCHC: 29.1 g/dL — ABNORMAL LOW (ref 30.0–36.0)
MCV: 92.5 fL (ref 80.0–100.0)
Monocytes Absolute: 0.9 10*3/uL (ref 0.1–1.0)
Monocytes Relative: 9 %
Neutro Abs: 8.2 10*3/uL — ABNORMAL HIGH (ref 1.7–7.7)
Neutrophils Relative %: 80 %
Platelets: 249 10*3/uL (ref 150–400)
RBC: 5.23 MIL/uL — ABNORMAL HIGH (ref 3.87–5.11)
RDW: 14.7 % (ref 11.5–15.5)
WBC: 10.3 10*3/uL (ref 4.0–10.5)
nRBC: 0 % (ref 0.0–0.2)
nRBC: 0 /100 WBC

## 2019-03-25 LAB — APTT: aPTT: 27 seconds (ref 24–36)

## 2019-03-25 LAB — POC SARS CORONAVIRUS 2 AG -  ED: SARS Coronavirus 2 Ag: POSITIVE — AB

## 2019-03-25 LAB — TROPONIN I (HIGH SENSITIVITY): Troponin I (High Sensitivity): 220 ng/L (ref <18)

## 2019-03-25 LAB — BRAIN NATRIURETIC PEPTIDE: B Natriuretic Peptide: 355 pg/mL — ABNORMAL HIGH (ref 0.0–100.0)

## 2019-03-25 MED ORDER — LORAZEPAM 2 MG/ML PO CONC: 1.0000 mg | ORAL | Status: DC | PRN

## 2019-03-25 MED ORDER — GLYCOPYRROLATE 0.2 MG/ML IJ SOLN: 0.2000 mg | INTRAMUSCULAR | Status: DC | PRN

## 2019-03-25 MED ORDER — LORAZEPAM 2 MG/ML IJ SOLN
1.0000 mg | INTRAMUSCULAR | Status: DC | PRN
  Administered 2019-03-26: 1 mg via INTRAVENOUS
  Filled 2019-03-25: qty 1

## 2019-03-25 MED ORDER — BIOTENE DRY MOUTH MT LIQD: 15.0000 mL | OROMUCOSAL | Status: DC | PRN

## 2019-03-25 MED ORDER — METOPROLOL TARTRATE 5 MG/5ML IV SOLN
5.0000 mg | Freq: Once | INTRAVENOUS | Status: AC
  Administered 2019-03-25: 22:00:00 5 mg via INTRAVENOUS
  Filled 2019-03-25: qty 5

## 2019-03-25 MED ORDER — HALOPERIDOL LACTATE 5 MG/ML IJ SOLN: 0.5000 mg | INTRAMUSCULAR | Status: DC | PRN

## 2019-03-25 MED ORDER — IPRATROPIUM-ALBUTEROL 20-100 MCG/ACT IN AERS
2.0000 | INHALATION_SPRAY | Freq: Four times a day (QID) | RESPIRATORY_TRACT | Status: DC
  Filled 2019-03-25: qty 4

## 2019-03-25 MED ORDER — ONDANSETRON HCL 4 MG/2ML IJ SOLN: 4.0000 mg | Freq: Four times a day (QID) | INTRAMUSCULAR | Status: DC | PRN

## 2019-03-25 MED ORDER — MORPHINE BOLUS VIA INFUSION
2.0000 mg | INTRAVENOUS | Status: DC | PRN
  Administered 2019-03-26 (×3): 2 mg via INTRAVENOUS
  Filled 2019-03-25: qty 2

## 2019-03-25 MED ORDER — HALOPERIDOL LACTATE 2 MG/ML PO CONC
0.5000 mg | ORAL | Status: DC | PRN
  Filled 2019-03-25: qty 0.3

## 2019-03-25 MED ORDER — METOPROLOL TARTRATE 5 MG/5ML IV SOLN
5.0000 mg | Freq: Once | INTRAVENOUS | Status: AC
  Administered 2019-03-26: 5 mg via INTRAVENOUS
  Filled 2019-03-25: qty 5

## 2019-03-25 MED ORDER — LORAZEPAM 1 MG PO TABS: 1.0000 mg | ORAL_TABLET | ORAL | Status: DC | PRN

## 2019-03-25 MED ORDER — ACETAMINOPHEN 325 MG PO TABS: 650.0000 mg | ORAL_TABLET | Freq: Four times a day (QID) | ORAL | Status: DC | PRN

## 2019-03-25 MED ORDER — GLYCOPYRROLATE 1 MG PO TABS
1.0000 mg | ORAL_TABLET | ORAL | Status: DC | PRN
  Filled 2019-03-25: qty 1

## 2019-03-25 MED ORDER — HALOPERIDOL 1 MG PO TABS: 0.5000 mg | ORAL_TABLET | ORAL | Status: DC | PRN

## 2019-03-25 MED ORDER — MORPHINE 100MG IN NS 100ML (1MG/ML) PREMIX INFUSION
3.0000 mg/h | INTRAVENOUS | Status: DC
  Administered 2019-03-26: 3 mg/h via INTRAVENOUS
  Filled 2019-03-25: qty 100

## 2019-03-25 MED ORDER — ACETAMINOPHEN 650 MG RE SUPP: 650.0000 mg | Freq: Four times a day (QID) | RECTAL | Status: DC | PRN

## 2019-03-25 MED ORDER — POLYVINYL ALCOHOL 1.4 % OP SOLN: 1.0000 [drp] | Freq: Four times a day (QID) | OPHTHALMIC | Status: DC | PRN

## 2019-03-25 MED ORDER — ONDANSETRON 4 MG PO TBDP: 4.0000 mg | ORAL_TABLET | Freq: Four times a day (QID) | ORAL | Status: DC | PRN

## 2019-03-25 NOTE — ED Provider Notes (Signed)
[facility] EMERGENCY DEPARTMENT Provider Note   CSN: [id number] Arrival date & time: 04/11/2019  1954     History Chief Complaint  Patient presents with  . Respiratory Distress  . Covid Positive    Misty Patterson is a 86 y.o. female with a history of COPD, dementia, DVT, recent diagnosis of A. fib, presenting to the ED from [facility] home in respiratory distress.  Patient reportedly has been a steady decline for the past 2 weeks.  Today her family FaceTime her and was concerned that she looked very sick.  She has some dementia baseline but is normally able to verbalize somewhat.  She does have a DNR/DNI form signed with her.  On arrival the patient is in respiratory distress and is on 15 L nonrebreather.  She is hypoxic to the 60s on room air.  She cannot provide any other immediate history.  Family is not immediately reachable  HPI     Past Medical History:  Diagnosis Date  . Anxiety    takes Valium daily  . Cancer ([place])    ovarian  . Cervical disc disease   . Chronic back pain   . Chronic kidney disease   . COPD (chronic obstructive pulmonary disease) ([place])    ProAir daily as needed  . CTS (carpal tunnel syndrome)   . Decreased GFR   . Dementia    takes aricept  . Depression    takes Zoloft daily  . DJD (degenerative joint disease) of cervical spine   . DVT (deep venous thrombosis) ([facility])    right leg  . Dyslipidemia    but doesn't take any meds   . Esophageal dysmotility   . GERD (gastroesophageal reflux disease)    takes pepcid daily  . Hypertension    takes Amlodipine and Atenolol daily  . Incontinence of urine   . Insomnia    takes Trazodone nightly  . Neuropathy   . Nocturia   . Osteoporosis    takes Vit D daily  . Ovarian cancer ([place])   . Peripheral neuropathy   . Peripheral vascular disease ([place])   . Pneumonia    hx of-15+yrs ago  . Radiculopathy   . Restless leg syndrome   . Urinary frequency   . Zenker's diverticulum     small    Patient Active Problem List   Diagnosis Date Noted  . End of life care 03/21/2019  . Fall 11/08/2017  . C2 cervical fracture ([place]) 11/06/2017  . Fall at home, initial encounter 11/06/2017  . New onset atrial fibrillation ([place]) 11/06/2017  . Chronic obstructive pulmonary disease ([place]) 12/13/2016  . Acquired hypothyroidism 12/13/2016  . Recurrent major depressive disorder ([place]) 12/13/2016  . OAB (overactive bladder) 12/13/2016  . Mixed hyperlipidemia 12/13/2016  . Pain in joint involving multiple sites 05/12/2016  . High risk medication use 05/12/2016  . PAD (peripheral artery disease) ([place]) 05/12/2016  . History of deep vein thrombosis (DVT) of lower extremity 05/12/2016  . Myelopathy ([place]) 01/28/2015  . Chest pain 01/22/2015  . Abnormal ECG 01/22/2015  . Dementia ([place]) 01/01/2015  . Cervical stenosis of spinal canal 01/01/2015  . Hereditary and idiopathic peripheral neuropathy 01/01/2015  . Radiculopathy 05/06/2014  . Pain of right lower leg 09/04/2013  . Paresthesia of both hands 09/04/2013  . Aftercare following surgery of the circulatory system, [place] 05/23/2013  . Muscle twitching-Right Leg 05/23/2013  . Peripheral vascular disease ([place]) 04/03/2013    Past Surgical History:  Procedure Laterality  Date  . ABDOMINAL AORTAGRAM  Feb. 20, 2015  . ABDOMINAL AORTAGRAM N/A 05/02/2013   Procedure: ABDOMINAL Maxcine [place];  Surgeon: Elam Dutch, MD;  Location: [facility] CATH LAB;  Service: Cardiovascular;  Laterality: N/A;  . ABDOMINAL HYSTERECTOMY    . BACK SURGERY    . cataract surgery Bilateral   . COLONOSCOPY    . POSTERIOR CERVICAL FUSION/FORAMINOTOMY N/A 01/28/2015   Procedure: POSTERIOR CERVICAL DECOMPRESSION FUSION LEVEL 5 WITH INSTRUMENTATION AND ALLOGRAFT;  Surgeon: Phylliss Bob, MD;  Location: [facility];  Service: Orthopedics;  Laterality: N/A;  Cervical posterior decompression fusion, cervical 3-4, cervical 4-5, cervical 5-6, cervical 6-7, cervical 6-thoracic 1 with  instrumentation and allograft.  . TONSILLECTOMY    . VEIN SURGERY  1950     OB History   No obstetric history on file.     Family History  Problem Relation Age of Onset  . Hypertension Mother   . Stroke Mother   . Lung cancer Son        Deceased, 48  . Other Father        MVA  . Hypertension Brother   . Stroke Brother   . Kidney disease Sister   . Stroke Brother   . Diabetes Brother        DM  . Stroke Brother   . Cancer Sister        throat  . Healthy Son   . Heart disease Daughter     Social History   Tobacco Use  . Smoking status: Never Smoker  . Smokeless tobacco: Never Used  Substance Use Topics  . Alcohol use: No    Alcohol/week: 0.0 standard drinks  . Drug use: No    Home Medications Prior to Admission medications   Medication Sig Start Date End Date Taking? Authorizing Provider  acetaminophen-codeine (TYLENOL #3) 300-30 MG tablet TAKE 1 TABLET BY MOUTH EVERY 8 HOURS AS NEEDED FOR PAIN Patient taking differently: Take 1 tablet by mouth every 8 (eight) hours as needed for moderate pain.  10/03/17   Reed, Tiffany L, DO  amLODipine (NORVASC) 5 MG tablet Take 1 tablet by mouth twice daily. 12/21/17   Gildardo Cranker, DO  B Complex-C (B-COMPLEX WITH VITAMIN C) tablet Take 1 tablet by mouth daily. Reported on 05/21/2015    [provider]  cholecalciferol (VITAMIN D) 1000 units tablet Take 1,000 Units by mouth daily.    [provider]  clopidogrel (PLAVIX) 75 MG tablet Take 75 mg by mouth daily. 09/21/17   [provider]  cyclobenzaprine (FLEXERIL) 5 MG tablet Take 1 tablet (5 mg total) by mouth 3 (three) times daily as needed for muscle spasms. 07/24/17   Gildardo Cranker, DO  diazepam (VALIUM) 10 MG tablet TAKE 1/2 TABLET BY MOUTH AT BEDTIME Patient taking differently: Take 5 mg by mouth at bedtime.  10/29/17   Gildardo Cranker, DO  donepezil (ARICEPT) 10 MG tablet Take 10 mg by mouth daily. 09/21/17   [provider]  gabapentin  (NEURONTIN) 600 MG tablet Take 1 tablet (600 mg total) by mouth at bedtime. 12/21/17   Gildardo Cranker, DO  levothyroxine (SYNTHROID, LEVOTHROID) 50 MCG tablet TAKE ONE TABLET BY MOUTH DAILY BEFORE BREAKFAST Patient taking differently: Take 50 mcg by mouth daily before breakfast.  10/03/17   Reed, Tiffany L, DO  memantine (NAMENDA XR) 28 MG CP24 24 hr capsule Take 1 capsule (28 mg total) by mouth daily. 06/27/17   Gildardo Cranker, DO  metoprolol tartrate (LOPRESSOR) 25 MG tablet  Take 1 tablet (25 mg total) by mouth 2 (two) times daily. 11/07/17   Isaac Bliss, Rayford Halsted, MD  Multiple Vitamin (MULTIVITAMIN WITH MINERALS) TABS tablet Take 1 tablet by mouth daily.    [provider]  Omega 3 1000 MG CAPS Take 1,000 mg by mouth daily.    [provider]  sertraline (ZOLOFT) 50 MG tablet Take 1 tablet (50 mg total) by mouth daily. Needs an appointment before anymore future refills. 01/21/18   Lauree Chandler, NP    Allergies    Aspirin, Formaldehyde, and Sodium pentobarbital [pentobarbital]  Review of Systems   Review of Systems  Unable to perform ROS: Dementia (Level 5 caveat)    Physical Exam Updated Vital Signs BP (!) 130/96   Pulse (!) 33   Temp (!) 100.5 F (38.1 C) (Rectal)   Resp (!) 31   SpO2 (!) 75%   Physical Exam Vitals and nursing note reviewed.  Constitutional:      General: She is in acute distress.     Appearance: She is well-developed. She is ill-appearing.  HENT:     Head: Normocephalic and atraumatic.  Cardiovascular:     Rate and Rhythm: Tachycardia present.     Pulses: Normal pulses.  Pulmonary:     Effort: Respiratory distress present.     Comments: Labored breathing 82% on 15L NRB Crackles in all lung fields Abdominal:     Palpations: Abdomen is soft.     Tenderness: There is no abdominal tenderness.  Musculoskeletal:     Cervical back: Neck supple.  Skin:    General: Skin is warm and dry.  Neurological:     Mental Status: She is  alert.     Comments: Can open eyes and mumbles Follows commands intermittently     ED Results / Procedures / Treatments   Labs (all labs ordered are listed, but only abnormal results are displayed) Labs Reviewed  COMPREHENSIVE METABOLIC PANEL - Abnormal; Notable for the following components:      Result Value   CO2 15 (*)    Glucose, Bld 284 (*)    BUN 41 (*)    Creatinine, Ser 2.02 (*)    Calcium 8.6 (*)    Albumin 3.2 (*)    AST 136 (*)    ALT 74 (*)    GFR calc non Af Amer 21 (*)    GFR calc Af Amer 25 (*)    Anion gap 20 (*)    All other components within normal limits  CBC WITH DIFFERENTIAL/PLATELET - Abnormal; Notable for the following components:   RBC 5.23 (*)    HCT 48.4 (*)    MCHC 29.1 (*)    Neutro Abs 8.2 (*)    All other components within normal limits  BRAIN NATRIURETIC PEPTIDE - Abnormal; Notable for the following components:   B Natriuretic Peptide 355.0 (*)    All other components within normal limits  LACTIC ACID, PLASMA - Abnormal; Notable for the following components:   Lactic Acid, Venous >11.0 (*)    All other components within normal limits  POC SARS CORONAVIRUS 2 AG -  ED - Abnormal; Notable for the following components:   SARS Coronavirus 2 Ag POSITIVE (*)    All other components within normal limits  TROPONIN I (HIGH SENSITIVITY) - Abnormal; Notable for the following components:   Troponin I (High Sensitivity) 220 (*)    All other components within normal limits  CULTURE, BLOOD (ROUTINE X 2)  CULTURE, BLOOD (ROUTINE X 2)  APTT  PROTIME-INR    EKG None  Radiology DG Chest Port 1 View  Result Date: 03/30/2019 CLINICAL DATA:  COVID-19 positive, respiratory distress EXAM: PORTABLE CHEST 1 VIEW COMPARISON:  Radiograph November 06, 2017 FINDINGS: Widespread consolidative airspace opacity throughout both lungs with a peripheral and basilar predominance. Obscuration of the hemidiaphragms could reflect either basilar consolidation or effusion.  Biapical pleuroparenchymal scarring is present. No pneumothorax. Prominent cardiomediastinal contours may be accentuated by portable technique. The aorta is calcified. The remaining cardiomediastinal contours are unremarkable. No acute osseous or soft tissue abnormality. The osseous structures appear diffusely demineralized which may limit detection of small or nondisplaced fractures. Multilevel degenerative changes are present in the imaged portions of the spine. Cardiac monitoring leads overlie the chest. Cervical fusion hardware is partially visualized. IMPRESSION: 1. Widespread consolidative airspace opacity throughout both lungs with a peripheral and basilar predominance. Differential considerations include multifocal pneumonia given COVID-19 positivity. Superimposed edema is not excluded. 2. Obscuration of the hemidiaphragms could reflect either basilar consolidation or effusion. Electronically Signed   By: Lovena Le M.D.   On: 03/23/2019 21:40    Procedures .Critical Care Performed by: Wyvonnia Dusky, MD Authorized by: Wyvonnia Dusky, MD   Critical care provider statement:    Critical care time (minutes):  45   Critical care was necessary to treat or prevent imminent or life-threatening deterioration of the following conditions:  Cardiac failure, circulatory failure and shock   Critical care was time spent personally by me on the following activities:  Discussions with consultants, evaluation of patient's response to treatment, examination of patient, ordering and performing treatments and interventions, ordering and review of laboratory studies, ordering and review of radiographic studies, pulse oximetry, re-evaluation of patient's condition, obtaining history from patient or surrogate and review of old charts Comments:     IV metoprolol for rate control, supplemental oxygen for acute respiratory failure in setting of COVID-19, multiple family discussions regarding goals of care    (including critical care time)  Medications Ordered in ED Medications  acetaminophen (TYLENOL) tablet 650 mg (has no administration in time range)    Or  acetaminophen (TYLENOL) suppository 650 mg (has no administration in time range)  haloperidol (HALDOL) tablet 0.5 mg (has no administration in time range)    Or  haloperidol (HALDOL) 2 MG/ML solution 0.5 mg (has no administration in time range)    Or  haloperidol lactate (HALDOL) injection 0.5 mg (has no administration in time range)  ondansetron (ZOFRAN-ODT) disintegrating tablet 4 mg (has no administration in time range)    Or  ondansetron (ZOFRAN) injection 4 mg (has no administration in time range)  glycopyrrolate (ROBINUL) tablet 1 mg (has no administration in time range)    Or  glycopyrrolate (ROBINUL) injection 0.2 mg (has no administration in time range)    Or  glycopyrrolate (ROBINUL) injection 0.2 mg (has no administration in time range)  antiseptic oral rinse (BIOTENE) solution 15 mL (has no administration in time range)  polyvinyl alcohol (LIQUIFILM TEARS) 1.4 % ophthalmic solution 1 drop (has no administration in time range)  morphine bolus via infusion 2 mg (has no administration in time range)  morphine 100mg  in NS 162mL (1mg /mL) infusion - premix (has no administration in time range)  LORazepam (ATIVAN) tablet 1 mg (has no administration in time range)    Or  LORazepam (ATIVAN) 2 MG/ML concentrated solution 1 mg (has no administration in time range)    Or  LORazepam (  ATIVAN) injection 1 mg (has no administration in time range)  Ipratropium-Albuterol (COMBIVENT) respimat 2 puff (has no administration in time range)  metoprolol tartrate (LOPRESSOR) injection 5 mg (5 mg Intravenous Given 04/06/2019 2223)  metoprolol tartrate (LOPRESSOR) injection 5 mg (5 mg Intravenous Given 2019/04/15 0038)    ED Course  I have reviewed the triage vital signs and the nursing notes.  Pertinent labs & imaging results that were available  during my care of the patient were reviewed by me and considered in my medical decision making (see chart for details).  86 year old female presenting to the emergency department with acute respiratory failure in the setting of A Fib with RVR and recent Covid (+) and fever.  Patient with labored breathing and retractions, requiring full NRB mask to maintain saturations.  Arrives with DNR/DNI form, limited noninvasive resuscitation measures are okay including IV antibiotics and fluids.  Attempts made to reach family members as noted in Ed course below.  Labs demonstrated multiorgan failure with troponemia, AKI, respiratory failuure   Misty Patterson was evaluated in Emergency Department on 15-Apr-2019 for the symptoms described in the history of present illness. She was evaluated in the context of the global COVID-19 pandemic, which necessitated consideration that the patient might be at risk for infection with the SARS-CoV-2 virus that causes COVID-19. Institutional protocols and algorithms that pertain to the evaluation of patients at risk for COVID-19 are in a state of rapid change based on information released by regulatory bodies including the CDC and federal and state organizations. These policies and algorithms were followed during the patient's care in the ED.   Clinical Course as of Mar 25 40  Tue Mar 25, 2019  2013 No response from son Misty Patterson   [MT]  2017 Cannot reach family members.  Patient POLST form specifying DNR/DNI but form specifying patient wants IV antibiotics and IVF if needed.  Her prognosis is very poor, and I suspect she may nonetheless die from respiratory failure within the next few hours   [MT]  2117 Family members contacted (granddaughter Misty Patterson who is her PoA).  I explained the dire situation.  They confirmed DNR/DNI.  I explained the patient is unable to have a conversation about [place] with me, but it's important the family consider further steps including  comfort care given her hypoxia and air hunger   [MT]  2145 IMPRESSION: 1. Widespread consolidative airspace opacity throughout both lungs with a peripheral and basilar predominance. Differential considerations include multifocal pneumonia given COVID-19 positivity. Superimposed edema is not excluded. 2. Obscuration of the hemidiaphragms could reflect either basilar consolidation or effusion.     [MT]  2209 Granddaughter to facetime with patient in the room.  Misty Patterson (granddaughter, PoA) updated at [phone] and aware of situation.  Patient now with multiorgan failure.  They are not ready to decide on comfort care measures yet, I will admit to hospitalist regardless, as the patient remains DNI and will not be intubated   [MT]  2230 Sating 82% on 15 L Misty Patterson   [MT]  Apr 15, 2019  0038 After family discussion including Dr Marlowe Sax, triad hospitalist, her family has agreed to proceed with comfort care measures.  We'll give the metoprolol for a fib rate control, and morphine for respiratory distress.  I anticipate the patient will likely pass away tonight, and her family understands this.  Repeat attempts were made by myself and her family to reach her son Misty Patterson by phone, but we  were unable to do so.  Her granddaughter Misty Patterson who is here reports she is the patient's PoA   [MT]    Clinical Course User Index [MT] Jceon Alverio, Carola Rhine, MD    Final Clinical Impression(s) / ED Diagnoses Final diagnoses:  COVID-19  Acute respiratory failure with hypoxia [facility])  Multiple organ failure with heart failure [facility])    Rx / DC Orders ED Discharge Orders    None       Wyvonnia Dusky, MD Apr 03, 2019 [phone]

## 2019-03-25 NOTE — ED Notes (Signed)
Pt more alert at this time  Will answer questions.  Pt st's she does not need anything for pain.  St's "I'am not in pain"  Pt has bil wheezing in both lungs

## 2019-03-25 NOTE — ED Triage Notes (Signed)
Pt to ED via [facility] from [facility] with c/o resp. Distress.   Pt is also Covid +.  On arrival to ED pt on NRB mask with labored resp.   Pt does have a DNR form with her.

## 2019-03-26 DIAGNOSIS — A4189 Other specified sepsis: Secondary | ICD-10-CM | POA: Diagnosis present

## 2019-03-26 DIAGNOSIS — J440 Chronic obstructive pulmonary disease with acute lower respiratory infection: Secondary | ICD-10-CM | POA: Diagnosis present

## 2019-03-26 DIAGNOSIS — U071 COVID-19: Secondary | ICD-10-CM

## 2019-03-26 DIAGNOSIS — R0602 Shortness of breath: Secondary | ICD-10-CM | POA: Diagnosis present

## 2019-03-26 DIAGNOSIS — Z66 Do not resuscitate: Secondary | ICD-10-CM | POA: Diagnosis present

## 2019-03-26 DIAGNOSIS — I129 Hypertensive chronic kidney disease with stage 1 through stage 4 chronic kidney disease, or unspecified chronic kidney disease: Secondary | ICD-10-CM | POA: Diagnosis present

## 2019-03-26 DIAGNOSIS — G9349 Other encephalopathy: Secondary | ICD-10-CM | POA: Diagnosis present

## 2019-03-26 DIAGNOSIS — J9601 Acute respiratory failure with hypoxia: Secondary | ICD-10-CM | POA: Diagnosis present

## 2019-03-26 DIAGNOSIS — J188 Other pneumonia, unspecified organism: Secondary | ICD-10-CM

## 2019-03-26 DIAGNOSIS — N183 Chronic kidney disease, stage 3 unspecified: Secondary | ICD-10-CM | POA: Diagnosis present

## 2019-03-26 DIAGNOSIS — F0390 Unspecified dementia without behavioral disturbance: Secondary | ICD-10-CM | POA: Diagnosis present

## 2019-03-26 DIAGNOSIS — J1282 Pneumonia due to coronavirus disease 2019: Secondary | ICD-10-CM | POA: Diagnosis present

## 2019-03-26 DIAGNOSIS — Z515 Encounter for palliative care: Secondary | ICD-10-CM | POA: Diagnosis present

## 2019-03-26 DIAGNOSIS — F419 Anxiety disorder, unspecified: Secondary | ICD-10-CM | POA: Diagnosis present

## 2019-03-26 DIAGNOSIS — E872 Acidosis: Secondary | ICD-10-CM | POA: Diagnosis present

## 2019-03-26 DIAGNOSIS — Z86718 Personal history of other venous thrombosis and embolism: Secondary | ICD-10-CM | POA: Diagnosis not present

## 2019-03-26 DIAGNOSIS — Z79899 Other long term (current) drug therapy: Secondary | ICD-10-CM | POA: Diagnosis not present

## 2019-03-26 DIAGNOSIS — G2581 Restless legs syndrome: Secondary | ICD-10-CM | POA: Diagnosis present

## 2019-03-26 DIAGNOSIS — Z7902 Long term (current) use of antithrombotics/antiplatelets: Secondary | ICD-10-CM | POA: Diagnosis not present

## 2019-03-26 DIAGNOSIS — N179 Acute kidney failure, unspecified: Secondary | ICD-10-CM | POA: Diagnosis present

## 2019-03-26 DIAGNOSIS — Z8543 Personal history of malignant neoplasm of ovary: Secondary | ICD-10-CM | POA: Diagnosis not present

## 2019-03-26 DIAGNOSIS — K219 Gastro-esophageal reflux disease without esophagitis: Secondary | ICD-10-CM | POA: Diagnosis present

## 2019-03-26 DIAGNOSIS — E785 Hyperlipidemia, unspecified: Secondary | ICD-10-CM | POA: Diagnosis present

## 2019-03-26 DIAGNOSIS — M810 Age-related osteoporosis without current pathological fracture: Secondary | ICD-10-CM | POA: Diagnosis present

## 2019-03-26 DIAGNOSIS — Z79890 Hormone replacement therapy (postmenopausal): Secondary | ICD-10-CM | POA: Diagnosis not present

## 2019-03-26 DIAGNOSIS — I739 Peripheral vascular disease, unspecified: Secondary | ICD-10-CM | POA: Diagnosis present

## 2019-03-26 DIAGNOSIS — J189 Pneumonia, unspecified organism: Secondary | ICD-10-CM

## 2019-03-30 LAB — CULTURE, BLOOD (ROUTINE X 2)
Culture: NO GROWTH
Culture: NO GROWTH
Special Requests: ADEQUATE

## 2019-04-14 NOTE — Progress Notes (Signed)
Pt arrived to the floor A & O x 2 (self/place) Pt on NRB 15 liters O2 at 70 %. Labored breathing using abd accessory muscles. Pt is tachypneic RR  38. HR 58. Comfort measures in place. [place] in place. Pt started on Morphine gtt. Pt resting comfortable.

## 2019-04-14 NOTE — Death Summary Note (Signed)
DEATH SUMMARY   Patient Details  Name: Misty Patterson MRN: [id number] DOB: 10-28-1926  Admission/Discharge Information   Admit Date:  Apr 19, 2019  Date of Death: Date of Death: 20-Apr-2019  Time of Death: Time of Death: 06/30/1303  Length of Stay: 0  Referring Physician: No primary care provider on file.   Reason(s) for Hospitalization  Respiratory Distress  Diagnoses  Preliminary cause of death:    Cardiopulmonary Arrest in the setting of Acute Hypoxic Respiratory Failure from COVID-19 Disease and Severe Multifocal Pneumonia associated with Viral Sepsis   Secondary Diagnoses (including complications and co-morbidities):  Principal Problem:   End of life care Active Problems:   New onset atrial fibrillation (HCC)   Acute respiratory failure with hypoxia (HCC)   Multifocal pneumonia   COVID-19 virus infection  Viral Sepsis   End-of-life care  Acute respiratory failure with hypoxia secondary to severe multifocal pneumonia from COVID-19 viral infection  A. fib with RVR  Severe Lactic Acidosis  AKI  Abnormal LFTs  Brief Hospital Course (including significant findings, care, treatment, and services provided and events leading to death)  Misty Patterson was a 86 year old Caucasian female with a past medical history significant for but not limited to PE, atrial fibrillation not on anticoagulation, hypertension, hyperlipidemia, CKD stage III, history of DVT, GERD, history of ovarian cancer, peripheral vascular disease, anxiety and depression, as well as other comorbidities who presents from her nursing facility for the evaluation of respiratory distress.  She tested positive for COVID-19 disease at her nursing home and was found to be hypoxic with saturations in the 60s on room air.    She arrived to the ED on nonrebreather and had labored respirations.  She has had a steady decline for the past 2 weeks and has dementia at baseline but normally is able to communicate with family.  She  is a confirmed DNR/DNI.  Further work-up revealed that she was found to be in atrial fibrillation with RVR with rates in the 160s as well as increased work of breathing with tachypnea with rates in the 30s along with Sepsis physiology been elevated temperature, tachypnea, and tachycardia with a source given her PNA.  Her oxygen saturation continued to be low in the 70's with the patient being on 15 L of nonrebreather.  Chest x-ray showed widespread consolidative airspace disease throughout both lungs with peripheral and basilar predominance concerning for severe COVID-19 disease multifocal pneumonia.  Labs also showed evidence of an acute kidney injury  My colleague Dr. Marlowe Sax as well as Dr. Octaviano Glow had a goals of care discussion with the patient's family including her granddaughters and POA.  Family again confirmed DNR/DNI and is explained to them that patient is critically ill with multiple organ failure related to COVID-19 viral illness and that she would likely die from respiratory disease.  Family has a full understanding of patient's current situation and decided to proceed with full comfort care measures.  She was admitted into the hospital for comfort care and was placed on a morphine drip and given boluses for air hunger.  She also was given glycopyrrolate and as well as Ativan.  Given that she is COVID-19 positive she was placed on airborne and contact precautions.  Her oxygen saturation remained low being 67% on 15 L nonrebreather and she was placed on 3 mg of IV morphine per hour via her drip.  She subsequently decompensated further and had an expected and anticipated in-hospital death as she passed away at 1305 on 04/20/19.  Nursing informed the patient's family.  Pertinent Labs and Studies  Significant Diagnostic Studies DG Chest Port 1 View  Result Date: 03/20/2019 CLINICAL DATA:  COVID-19 positive, respiratory distress EXAM: PORTABLE CHEST 1 VIEW COMPARISON:  Radiograph November 06, 2017 FINDINGS: Widespread consolidative airspace opacity throughout both lungs with a peripheral and basilar predominance. Obscuration of the hemidiaphragms could reflect either basilar consolidation or effusion. Biapical pleuroparenchymal scarring is present. No pneumothorax. Prominent cardiomediastinal contours may be accentuated by portable technique. The aorta is calcified. The remaining cardiomediastinal contours are unremarkable. No acute osseous or soft tissue abnormality. The osseous structures appear diffusely demineralized which may limit detection of small or nondisplaced fractures. Multilevel degenerative changes are present in the imaged portions of the spine. Cardiac monitoring leads overlie the chest. Cervical fusion hardware is partially visualized. IMPRESSION: 1. Widespread consolidative airspace opacity throughout both lungs with a peripheral and basilar predominance. Differential considerations include multifocal pneumonia given COVID-19 positivity. Superimposed edema is not excluded. 2. Obscuration of the hemidiaphragms could reflect either basilar consolidation or effusion. Electronically Signed   By: Lovena Le M.D.   On: 03/20/2019 21:40   Microbiology Recent Results (from the past 240 hour(s))  Blood Culture (routine x 2)     Status: None (Preliminary result)   Collection Time: 04/12/2019  8:15 PM   Specimen: BLOOD RIGHT HAND  Result Value Ref Range Status   Specimen Description BLOOD RIGHT HAND  Final   Special Requests   Final    BOTTLES DRAWN AEROBIC AND ANAEROBIC Blood Culture results may not be optimal due to an inadequate volume of blood received in culture bottles   Culture   Final    NO GROWTH < 12 HOURS Performed at [facility], [place] [address] [place], [place] [address]    Report Status PENDING  Incomplete  Blood Culture (routine x 2)     Status: None (Preliminary result)   Collection Time: 03/17/2019  8:15 PM   Specimen: BLOOD  Result Value Ref Range Status    Specimen Description BLOOD LEFT ANTECUBITAL  Final   Special Requests   Final    BOTTLES DRAWN AEROBIC AND ANAEROBIC Blood Culture adequate volume   Culture   Final    NO GROWTH < 12 HOURS Performed at [facility], [place] [address] [place], [place] [address]    Report Status PENDING  Incomplete   Lab Basic Metabolic Panel: Recent Labs  Lab 03/18/2019 2025  NA 136  K 4.3  CL 101  CO2 15*  GLUCOSE 284*  BUN 41*  CREATININE 2.02*  CALCIUM 8.6*   Liver Function Tests: Recent Labs  Lab 04/07/2019 2025  AST 136*  ALT 74*  ALKPHOS 66  BILITOT 0.7  PROT 7.5  ALBUMIN 3.2*   No results for input(s): LIPASE, AMYLASE in the last 168 hours. No results for input(s): AMMONIA in the last 168 hours. CBC: Recent Labs  Lab 04/05/2019 2025  WBC 10.3  NEUTROABS 8.2*  HGB 14.1  HCT 48.4*  MCV 92.5  PLT 249   Cardiac Enzymes: No results for input(s): CKTOTAL, CKMB, CKMBINDEX, TROPONINI in the last 168 hours. Sepsis Labs: Recent Labs  Lab 03/18/2019 2025  WBC 10.3  LATICACIDVEN >11.0*    Procedures/Operations  None  [facility], DO  2019-04-15, 1:36 PM

## 2019-04-14 NOTE — Progress Notes (Signed)
Responded to page for pastoral support for pt and family. Nurse requested I escort family to see pt in room 74. Dustine's granddaughter said that staff briefed them on the seeing Shaquia in consult B room. I escorted family to see pt outside of room. Nurse gave phone to daughter to talk to pt. While back in consult room, I offered them space to voice concerns. The granddaughter said she was grateful to talk to [place]. They said they felt like staff was very compassionate and offered gratitude. They said that Minerva is a strong Christian. I offered them words of comfort, ministry of presence and prayer.   Chaplain Resident Fidel Levy  [phone]

## 2019-04-14 NOTE — H&P (Signed)
History and Physical    Misty Patterson [id number] DOB: 1926-08-05 DOA: 04/11/2019  PCP: Lauree Chandler, NP Patient coming from: [place] home  Chief Complaint: Respiratory distress  HPI: Misty Patterson is a 86 y.o. female with medical history significant of dementia, COPD, A. fib not on anticoagulation, hypertension, hyperlipidemia, CKD stage III, history of DVT, GERD, history of ovarian cancer, peripheral vascular disease, anxiety, depression, and conditions listed below presenting to the ED from her nursing home for evaluation of respiratory distress.  She tested positive for COVID-19 at her nursing home.  Hypoxic to the 60s on room air.  Patient arrived to the ED on a nonrebreather mask and had labored respirations.  Patient has had a steady decline for the past 2 weeks.  She has dementia at baseline but is normally able to communicate with her family.  She has a signed DNR/DNI form with her.  ED Course: SARS-CoV-2 rapid antigen test positive.  Febrile with temperature 100.5 F.  Found to be in A. fib with RVR with rate up to 160s.  Has significantly increased work of breathing and tachypneic with respiratory rate in the 30s.  Oxygen saturation continued to be in the low 70s while patient was on 15 L oxygen via nonrebreather.  Labs showing no leukocytosis.  Lactic acid >11.0. Bicarb 15, anion gap 20.  Blood glucose 284.  BUN 41, creatinine 2.0.  Baseline creatinine 1.1.  Transaminases elevated (AST 136, ALT 74).  High-sensitivity troponin elevated at 220.  BNP 355.  Blood culture x2 pending.  Chest x-ray personally reviewed showing widespread consolidative airspace opacities throughout both lungs with peripheral and basilar predominance concerning for severe COVID-19 multifocal pneumonia. She was given Lopressor for rate control.  Review of Systems:  All systems reviewed and apart from history of presenting illness, are negative.  Past Medical History:  Diagnosis Date  . Anxiety     takes Valium daily  . Cancer ([place])    ovarian  . Cervical disc disease   . Chronic back pain   . Chronic kidney disease   . COPD (chronic obstructive pulmonary disease) ([place])    ProAir daily as needed  . CTS (carpal tunnel syndrome)   . Decreased GFR   . Dementia    takes aricept  . Depression    takes Zoloft daily  . DJD (degenerative joint disease) of cervical spine   . DVT (deep venous thrombosis) (HCC)    right leg  . Dyslipidemia    but doesn't take any meds   . Esophageal dysmotility   . GERD (gastroesophageal reflux disease)    takes pepcid daily  . Hypertension    takes Amlodipine and Atenolol daily  . Incontinence of urine   . Insomnia    takes Trazodone nightly  . Neuropathy   . Nocturia   . Osteoporosis    takes Vit D daily  . Ovarian cancer ([place])   . Peripheral neuropathy   . Peripheral vascular disease ([place])   . Pneumonia    hx of-15+yrs ago  . Radiculopathy   . Restless leg syndrome   . Urinary frequency   . Zenker's diverticulum    small    Past Surgical History:  Procedure Laterality Date  . ABDOMINAL AORTAGRAM  Feb. 20, 2015  . ABDOMINAL AORTAGRAM N/A 05/02/2013   Procedure: ABDOMINAL Maxcine Ham;  Surgeon: Elam Dutch, MD;  Location: [facility] CATH LAB;  Service: Cardiovascular;  Laterality: N/A;  . ABDOMINAL HYSTERECTOMY    .  BACK SURGERY    . cataract surgery Bilateral   . COLONOSCOPY    . POSTERIOR CERVICAL FUSION/FORAMINOTOMY N/A 01/28/2015   Procedure: POSTERIOR CERVICAL DECOMPRESSION FUSION LEVEL 5 WITH INSTRUMENTATION AND ALLOGRAFT;  Surgeon: Phylliss Bob, MD;  Location: [facility];  Service: Orthopedics;  Laterality: N/A;  Cervical posterior decompression fusion, cervical 3-4, cervical 4-5, cervical 5-6, cervical 6-7, cervical 6-thoracic 1 with instrumentation and allograft.  . TONSILLECTOMY    . [place]     reports that she has never smoked. She has never used smokeless tobacco. She reports that she does not drink alcohol or  use drugs.  Allergies  Allergen Reactions  . Aspirin Other (See Comments)    REACTION:  Choking sensation, possible throat swelling  . Formaldehyde Swelling    Tongue swelling  . Sodium Pentobarbital [Pentobarbital] Other (See Comments)    Memory Loss    Family History  Problem Relation Age of Onset  . Hypertension Mother   . Stroke Mother   . Lung cancer Son        Deceased, 17  . Other Father        MVA  . Hypertension Brother   . Stroke Brother   . Kidney disease Sister   . Stroke Brother   . Diabetes Brother        DM  . Stroke Brother   . Cancer Sister        throat  . Healthy Son   . Heart disease Daughter     Prior to Admission medications   Medication Sig Start Date End Date Taking? Authorizing Provider  acetaminophen-codeine (TYLENOL #3) 300-30 MG tablet TAKE 1 TABLET BY MOUTH EVERY 8 HOURS AS NEEDED FOR PAIN Patient taking differently: Take 1 tablet by mouth every 8 (eight) hours as needed for moderate pain.  10/03/17   Reed, Tiffany L, DO  amLODipine (NORVASC) 5 MG tablet Take 1 tablet by mouth twice daily. 12/21/17   Gildardo Cranker, DO  B Complex-C (B-COMPLEX WITH VITAMIN C) tablet Take 1 tablet by mouth daily. Reported on 05/21/2015    [provider]  cholecalciferol (VITAMIN D) 1000 units tablet Take 1,000 Units by mouth daily.    [provider]  clopidogrel (PLAVIX) 75 MG tablet Take 75 mg by mouth daily. 09/21/17   [provider]  cyclobenzaprine (FLEXERIL) 5 MG tablet Take 1 tablet (5 mg total) by mouth 3 (three) times daily as needed for muscle spasms. 07/24/17   Gildardo Cranker, DO  diazepam (VALIUM) 10 MG tablet TAKE 1/2 TABLET BY MOUTH AT BEDTIME Patient taking differently: Take 5 mg by mouth at bedtime.  10/29/17   Gildardo Cranker, DO  donepezil (ARICEPT) 10 MG tablet Take 10 mg by mouth daily. 09/21/17   [provider]  gabapentin (NEURONTIN) 600 MG tablet Take 1 tablet (600 mg total) by mouth at bedtime. 12/21/17    Gildardo Cranker, DO  levothyroxine (SYNTHROID, LEVOTHROID) 50 MCG tablet TAKE ONE TABLET BY MOUTH DAILY BEFORE BREAKFAST Patient taking differently: Take 50 mcg by mouth daily before breakfast.  10/03/17   Reed, Tiffany L, DO  memantine (NAMENDA XR) 28 MG CP24 24 hr capsule Take 1 capsule (28 mg total) by mouth daily. 06/27/17   Gildardo Cranker, DO  metoprolol tartrate (LOPRESSOR) 25 MG tablet Take 1 tablet (25 mg total) by mouth 2 (two) times daily. 11/07/17   Isaac Bliss, Rayford Halsted, MD  Multiple Vitamin (MULTIVITAMIN WITH MINERALS) TABS tablet Take 1 tablet by mouth  daily.    [provider]  Omega 3 1000 MG CAPS Take 1,000 mg by mouth daily.    [provider]  sertraline (ZOLOFT) 50 MG tablet Take 1 tablet (50 mg total) by mouth daily. Needs an appointment before anymore future refills. 01/21/18   Lauree Chandler, NP    Physical Exam: Vitals:   03/16/2019 2230 04/10/2019 2245 04/12/2019 2315 Apr 18, 2019 0000  BP: 119/67 108/79 (!) 130/96 (!) 152/127  Pulse: 99 100 (!) 33 79  Resp: (!) 30 (!) 29 (!) 31 (!) 35  Temp:      TempSrc:      SpO2: (!) 72% (!) 73% (!) 75% (!) 76%    Physical Exam  Constitutional: She appears distressed.  HENT:  Head: Normocephalic.  Eyes: Pupils are equal, round, and reactive to light. Right eye exhibits no discharge. Left eye exhibits no discharge.  Cardiovascular:  Irregularly irregular rhythm Heart rate in the 140s  Pulmonary/Chest: She is in respiratory distress. She has no wheezes.  Significantly increased work of breathing with accessory muscle use Respiratory rate in the 30s Oxygen saturation consistently in the low 70s on 15 L oxygen via nonrebreather Coarse breath sounds upon auscultation of lungs bilaterally  Abdominal: Soft. Bowel sounds are normal. She exhibits no distension. There is no abdominal tenderness. There is no guarding.  Musculoskeletal:        General: No edema.     Cervical back: Neck supple.  Neurological:    Somnolent but opening eyes intermittently Unable to communicate and not following commands  Skin: Skin is warm and dry.     Labs on Admission: I have personally reviewed following labs and imaging studies  CBC: Recent Labs  Lab 04/09/2019 2025  WBC 10.3  NEUTROABS 8.2*  HGB 14.1  HCT 48.4*  MCV 92.5  PLT 0000000   Basic Metabolic Panel: Recent Labs  Lab 04/10/2019 2025  NA 136  K 4.3  CL 101  CO2 15*  GLUCOSE 284*  BUN 41*  CREATININE 2.02*  CALCIUM 8.6*   GFR: CrCl cannot be calculated (Unknown ideal weight.). Liver Function Tests: Recent Labs  Lab 03/28/2019 2025  AST 136*  ALT 74*  ALKPHOS 66  BILITOT 0.7  PROT 7.5  ALBUMIN 3.2*   No results for input(s): LIPASE, AMYLASE in the last 168 hours. No results for input(s): AMMONIA in the last 168 hours. Coagulation Profile: Recent Labs  Lab 03/25/19 2025  INR 1.2   Cardiac Enzymes: No results for input(s): CKTOTAL, CKMB, CKMBINDEX, TROPONINI in the last 168 hours. BNP (last 3 results) No results for input(s): PROBNP in the last 8760 hours. HbA1C: No results for input(s): HGBA1C in the last 72 hours. CBG: No results for input(s): GLUCAP in the last 168 hours. Lipid Profile: No results for input(s): CHOL, HDL, LDLCALC, TRIG, CHOLHDL, LDLDIRECT in the last 72 hours. Thyroid Function Tests: No results for input(s): TSH, T4TOTAL, FREET4, T3FREE, THYROIDAB in the last 72 hours. Anemia Panel: No results for input(s): VITAMINB12, FOLATE, FERRITIN, TIBC, IRON, RETICCTPCT in the last 72 hours. Urine analysis:    Component Value Date/Time   COLORURINE YELLOW 11/06/2017 1756   APPEARANCEUR HAZY (A) 11/06/2017 1756   LABSPEC 1.014 11/06/2017 1756   PHURINE 6.0 11/06/2017 1756   GLUCOSEU NEGATIVE 11/06/2017 1756   HGBUR NEGATIVE 11/06/2017 1756   BILIRUBINUR NEGATIVE 11/06/2017 1756   KETONESUR NEGATIVE 11/06/2017 1756   PROTEINUR >=300 (A) 11/06/2017 1756   UROBILINOGEN 0.2 01/25/2015 1404   NITRITE NEGATIVE  11/06/2017 1756   LEUKOCYTESUR MODERATE (A) 11/06/2017 1756    Radiological Exams on Admission: DG Chest Port 1 View  Result Date: 03/31/2019 CLINICAL DATA:  COVID-19 positive, respiratory distress EXAM: PORTABLE CHEST 1 VIEW COMPARISON:  Radiograph November 06, 2017 FINDINGS: Widespread consolidative airspace opacity throughout both lungs with a peripheral and basilar predominance. Obscuration of the hemidiaphragms could reflect either basilar consolidation or effusion. Biapical pleuroparenchymal scarring is present. No pneumothorax. Prominent cardiomediastinal contours may be accentuated by portable technique. The aorta is calcified. The remaining cardiomediastinal contours are unremarkable. No acute osseous or soft tissue abnormality. The osseous structures appear diffusely demineralized which may limit detection of small or nondisplaced fractures. Multilevel degenerative changes are present in the imaged portions of the spine. Cardiac monitoring leads overlie the chest. Cervical fusion hardware is partially visualized. IMPRESSION: 1. Widespread consolidative airspace opacity throughout both lungs with a peripheral and basilar predominance. Differential considerations include multifocal pneumonia given COVID-19 positivity. Superimposed edema is not excluded. 2. Obscuration of the hemidiaphragms could reflect either basilar consolidation or effusion. Electronically Signed   By: Lovena Le M.D.   On: 04/02/2019 21:40    EKG: Independently reviewed.  A. fib with RVR, heart rate 162.  Q waves in anterior leads similar to prior tracing.  Assessment/Plan Principal Problem:   End of life care Active Problems:   New onset atrial fibrillation (HCC)   Acute respiratory failure with hypoxia (HCC)   Multifocal pneumonia   COVID-19 virus infection   Patient is a 86 year old female with medical history significant of dementia, COPD, A. fib not on anticoagulation, hypertension, hyperlipidemia, CKD stage  III, history of DVT, GERD, history of ovarian cancer, peripheral vascular disease, anxiety, depression, and conditions listed below presenting with acute respiratory failure.  She tested positive for COVID-19 at her nursing home and repeat SARS-CoV-2 rapid antigen test done in the ED positive.  Hypoxic to the 60s on room air and was placed on a nonrebreather.  Oxygen saturation continues to be consistently in the low 70s despite patient being on 15 L oxygen via nonrebreather.  Patient has significantly increased work of breathing with accessory muscle use.  Tachypneic with respiratory rate in the 30s.  She is encephalopathic in the setting of severe hypoxia.  Opens eyes intermittently but not able to communicate or follow commands.  Work-up was done in the ED concerning for severe multifocal pneumonia secondary to COVID-19 viral infection.  In addition, she has sepsis and multiorgan failure related to her acute viral illness.  Found to be in A. fib with RVR with rate up to 160s.  Labs showing troponinemia and elevated BNP.  Has severe lactic acidosis and metabolic acidosis.  Labs also showing evidence of acute kidney injury.  End-of-life care Myself and ED physician Dr. Octaviano Glow had a goals of care discussion with the patient's family including her granddaughters Consulting civil engineer ([place]) and Nilda Calamity.  Family again confirms that the patient is DNR and DNI.  Explained to the family that the patient is critically ill with multiorgan failure related to her COVID-19 viral infection and will likely die from respiratory failure within the next few hours.  Family has full understanding of the patient's current situation and has decided to proceed with full comfort care measures.  Anticipate that the patient will likely pass away tonight and family understands.  Attempts were made to reach the patient's son Dellis Filbert over the phone but were unsuccessful. -Full comfort care measures -DNR and DNI -Continue airborne  and  contact precautions as patient is COVID-19 positive -Morphine bolus and infusion for air hunger -Ativan as needed -Glycopyrrolate as needed for excessive secretions -Combivent inhaler -Continue supplemental oxygen via nonrebreather  Acute respiratory failure with hypoxia secondary to severe multifocal pneumonia from COVID-19 viral infection Oxygen saturation consistently in the low 70s despite patient being on 15 L oxygen via nonrebreather.  Patient has significantly increased work of breathing with accessory muscle use.  Tachypneic with respiratory rate in the 30s. -No intubation or mechanical ventilation per patient's previously stated wishes.  Confirmed by family. -Continue supplemental oxygen via nonrebreather -Morphine bolus and infusion for air hunger -Combivent inhaler  A. fib with RVR Suspect precipitated by acute respiratory failure/ hypoxia.  Rate currently in the 140s. Found to be in A. fib during her previous hospitalization in August 2019.  She was started on metoprolol for rate control.  Not a candidate for anticoagulation given advanced age, dementia, and increased risk of falls.   -Full comfort care measures as mentioned above -Lopressor for rate control/ comfort  Code Status: DNR and DNI Disposition Plan: Anticipate death from acute respiratory failure within the next few hours. Admission status: It is my clinical opinion that referral for OBSERVATION is reasonable and necessary in this patient based on the above information provided. The aforementioned taken together are felt to place the patient at high risk for further clinical deterioration. However it is anticipated that the patient may be medically stable for discharge from the hospital within 24 to 48 hours.  The medical decision making on this patient was of high complexity and the patient is at high risk for clinical deterioration, therefore this is a level 3 visit.  Shela Leff MD [facility] Pager [phone]  If 7PM-7AM, please contact night-coverage [email or web address] Password [facility]  April 10, 2019, 1:38 AM

## 2019-04-14 NOTE — Progress Notes (Signed)
   Vital Signs MEWS/VS Documentation      04/20/2019 0530 Apr 20, 2019 0629 04-20-2019 0634 04-20-19 1034   MEWS Score:  6  6  3  2    MEWS Score Color:  Red  Red  Yellow  Yellow   Resp:  (!) 32  --  (!) 38  13   Pulse:  74  --  (!) 58  78   BP:  (!) 170/92  --  (!) 137/93  --   Temp:  --  --  98.5 F (36.9 C)  --   O2 Device:  --  --  Non-rebreather Mask  Non-rebreather Mask   O2 Flow Rate (L/min):  --  --  15 L/min  15 L/min   Level of Consciousness:  --  Alert  Alert  Responds to Voice      - Not an acute change, MD aware -Patient on Comfort care     Nashid Pellum J Sudiksha Victor 2019/04/20,11:32 AM

## 2019-04-14 NOTE — Progress Notes (Signed)
Care started prior to midnight in the emergency room patient admitted early this morning after midnight and H&P has been reviewed and I am in current agreement with assessment and plan done by Dr. Shela Leff.  Additional changes to the plan of care been made accordingly.  The patient is a 86 year old Caucasian female with a past medical history significant for but not limited to PE, atrial fibrillation not on anticoagulation, hypertension, hyperlipidemia, CKD stage III, history of DVT, GERD, history of ovarian cancer, peripheral vascular disease, anxiety and depression, as well as other comorbidities who presents from her nursing facility for the evaluation of respiratory distress.  She tested positive for COVID-19 disease at her nursing home and was found to be hypoxic with saturations in the 60s on room air.  She arrived to the ED on nonrebreather and had labored respirations.  She has had a 70 decline for the past 2 weeks and has dementia at baseline but normally is able to communicate with family.  She is a confirmed DNR/DNI.  Further work-up revealed that she was found to be in atrial fibrillation with RVR with rates in the 160s as well as increased work of breathing with tachypnea with rates in the 30s.  Her oxygen saturation continued to be low in the 70's with the patient being on 15 L of nonrebreather.  Chest x-ray showed widespread consolidative airspace disease throughout both lungs with peripheral and basilar predominance concerning for severe COVID-19 disease multifocal pneumonia.  Labs also showed evidence of an acute kidney injury  My colleague Dr. Marlowe Sax as well as Dr. Audelia Hives phone had a goals of care discussion with the patient's family including her granddaughters and POA.  Family again confirmed DNR/DNI and is explained to them that patient is critically ill with multiple organ failure related to COVID-19 viral illness and that she would likely die from respiratory disease.  Family has  a full understanding of patient's current situation and decided to proceed with full comfort care measures.  She was admitted into the hospital for comfort care and was placed on a morphine drip and given boluses for air hunger.  She also was given glycopyrrolate and as well as Ativan.  Given that she is COVID-19 positive she was placed on airborne contact precautions.  She is admitted for the following and treated for but not limited to:  End-of-life care -My Colleague Dr. Marlowe Sax and ED physician Dr. Octaviano Glow had a goals of care discussion with the patient's family including her granddaughters Danelle Earthly ([place]) and Nilda Calamity.  Family again confirms that the patient is DNR and DNI.   -Explained to the family that the patient is critically ill with multiorgan failure related to her COVID-19 viral infection and will likely die from respiratory failure within the next few hours.  Family has full understanding of the patient's current situation and has decided to proceed with full comfort care measures.  Anticipate that the patient will likely pass away tonight and family understands.  Attempts were made to reach the patient's son Dellis Filbert over the phone but were unsuccessful. -Full comfort care measures -DNR and DNI -Continue airborne and contact precautions as patient is COVID-19 positive -Morphine bolus and infusion for air hunger -Ativan as needed -Glycopyrrolate as needed for excessive secretions -Combivent inhaler -Continue supplemental oxygen via nonrebreather will titrate down given that she is not having any air hunger  Acute respiratory failure with hypoxia secondary to severe multifocal pneumonia from COVID-19 viral infection -Oxygen saturation consistently in  the low 70s despite patient being on 15 L oxygen via nonrebreather.  Patient has significantly increased work of breathing with accessory muscle use.  Tachypneic with respiratory rate in the 30s. -No intubation or mechanical  ventilation per patient's previously stated wishes.  Confirmed by family. -Continue supplemental oxygen via nonrebreather -Morphine bolus and infusion for air hunger -Combivent inhaler  A. fib with RVR -Suspect precipitated by acute respiratory failure/ hypoxia.   -Rate was currently in the 140s. Found to be in A. fib during her previous hospitalization in August 2019.  She was started on metoprolol for rate control.   -Not a candidate for anticoagulation given advanced age, dementia, and increased risk of falls.   -Full comfort care measures as mentioned above -Lopressor for rate control/ comfort  Severe Lactic Acidosis High Anion Gap Metabolic Acidosis  -Patient had a lactic acid greater than 11.0 and AG was 20, with a CO2 of 15 and an chloride level of 101 -Currently is full comfort care measures and will not treat further.  AKI -BUN/creatinine on admission was 41/2.02 next-we will not repeat lab work as she is full comfort measures  Abnormal LFTs The patient's AST was 136 and ALT was 74 -In the setting of her COVID-19 disease and multiorgan failure  -Will not trend further than that she is comfort care  The patient is critically ill and has a very high risk of worsening, decompensation and likely in hospital of death within hours to days.  She currently remains hypoxic on a nonrebreather and she does respond to her name and verbal commands slightly.  We will continue comfort care and anticipate in-hospital death which is expected.  Will not repeat blood work and imaging in the a.m. given her [place]

## 2019-04-14 NOTE — ED Notes (Signed)
Pt talking to family via telephone while they stand outside of room.  Pt speaking in short sentences.  Pt continues to deny any pain.  Pt remains on NRB mask at 15 LPM

## 2019-04-14 DEATH — deceased
